# Patient Record
Sex: Female | Born: 1975
Health system: Southern US, Community
[De-identification: ages and names within clinical notes are randomized; demographics above are authoritative.]

## PROBLEM LIST (undated history)

## (undated) DIAGNOSIS — F419 Anxiety disorder, unspecified: Secondary | ICD-10-CM

## (undated) DIAGNOSIS — I10 Essential (primary) hypertension: Secondary | ICD-10-CM

## (undated) DIAGNOSIS — R519 Headache, unspecified: Secondary | ICD-10-CM

## (undated) DIAGNOSIS — G5600 Carpal tunnel syndrome, unspecified upper limb: Secondary | ICD-10-CM

## (undated) DIAGNOSIS — K219 Gastro-esophageal reflux disease without esophagitis: Secondary | ICD-10-CM

## (undated) DIAGNOSIS — M1990 Unspecified osteoarthritis, unspecified site: Secondary | ICD-10-CM

## (undated) HISTORY — PX: BREAST CYST ASPIRATION: SHX578

## (undated) HISTORY — PX: OTHER SURGICAL HISTORY: SHX169

## (undated) HISTORY — PX: LAPAROSCOPY FOR ECTOPIC PREGNANCY: SUR765

## (undated) HISTORY — DX: Essential (primary) hypertension: I10

## (undated) HISTORY — PX: ABDOMINAL HYSTERECTOMY: SHX81

---

## 2011-02-07 DIAGNOSIS — A6000 Herpesviral infection of urogenital system, unspecified: Secondary | ICD-10-CM | POA: Insufficient documentation

## 2012-06-04 DIAGNOSIS — R7611 Nonspecific reaction to tuberculin skin test without active tuberculosis: Secondary | ICD-10-CM | POA: Insufficient documentation

## 2014-09-27 LAB — HIV ANTIBODY (ROUTINE TESTING W REFLEX): HIV 1&2 Ab, 4th Generation: NEGATIVE

## 2015-03-22 DIAGNOSIS — F419 Anxiety disorder, unspecified: Secondary | ICD-10-CM | POA: Insufficient documentation

## 2015-05-29 DIAGNOSIS — I10 Essential (primary) hypertension: Secondary | ICD-10-CM | POA: Insufficient documentation

## 2016-09-03 DIAGNOSIS — D508 Other iron deficiency anemias: Secondary | ICD-10-CM | POA: Insufficient documentation

## 2016-10-16 LAB — HM PAP SMEAR: HM Pap smear: NEGATIVE

## 2016-10-23 LAB — HM MAMMOGRAPHY

## 2017-01-26 DIAGNOSIS — M79645 Pain in left finger(s): Secondary | ICD-10-CM | POA: Diagnosis not present

## 2017-01-26 DIAGNOSIS — I10 Essential (primary) hypertension: Secondary | ICD-10-CM | POA: Diagnosis not present

## 2017-01-26 DIAGNOSIS — E559 Vitamin D deficiency, unspecified: Secondary | ICD-10-CM | POA: Diagnosis not present

## 2017-03-21 DIAGNOSIS — R102 Pelvic and perineal pain: Secondary | ICD-10-CM | POA: Diagnosis not present

## 2017-03-21 DIAGNOSIS — Z30431 Encounter for routine checking of intrauterine contraceptive device: Secondary | ICD-10-CM | POA: Diagnosis not present

## 2017-04-04 DIAGNOSIS — R1031 Right lower quadrant pain: Secondary | ICD-10-CM | POA: Diagnosis not present

## 2017-04-04 DIAGNOSIS — Z30431 Encounter for routine checking of intrauterine contraceptive device: Secondary | ICD-10-CM | POA: Diagnosis not present

## 2017-04-04 DIAGNOSIS — R102 Pelvic and perineal pain: Secondary | ICD-10-CM | POA: Diagnosis not present

## 2017-04-12 DIAGNOSIS — N946 Dysmenorrhea, unspecified: Secondary | ICD-10-CM | POA: Diagnosis not present

## 2017-04-12 DIAGNOSIS — G44209 Tension-type headache, unspecified, not intractable: Secondary | ICD-10-CM | POA: Diagnosis not present

## 2017-04-12 DIAGNOSIS — G43009 Migraine without aura, not intractable, without status migrainosus: Secondary | ICD-10-CM | POA: Diagnosis not present

## 2017-06-27 DIAGNOSIS — M722 Plantar fascial fibromatosis: Secondary | ICD-10-CM | POA: Diagnosis not present

## 2017-06-27 DIAGNOSIS — I10 Essential (primary) hypertension: Secondary | ICD-10-CM | POA: Diagnosis not present

## 2017-06-27 DIAGNOSIS — R05 Cough: Secondary | ICD-10-CM | POA: Diagnosis not present

## 2017-08-03 ENCOUNTER — Encounter: Payer: Self-pay | Admitting: Family Medicine

## 2017-08-03 ENCOUNTER — Ambulatory Visit (INDEPENDENT_AMBULATORY_CARE_PROVIDER_SITE_OTHER): Payer: 59 | Admitting: Family Medicine

## 2017-08-03 VITALS — BP 142/86 | HR 86 | Temp 98.8°F | Resp 16 | Ht 66.0 in | Wt 236.2 lb

## 2017-08-03 DIAGNOSIS — I10 Essential (primary) hypertension: Secondary | ICD-10-CM

## 2017-08-03 DIAGNOSIS — R29818 Other symptoms and signs involving the nervous system: Secondary | ICD-10-CM

## 2017-08-03 DIAGNOSIS — Z7689 Persons encountering health services in other specified circumstances: Secondary | ICD-10-CM | POA: Diagnosis not present

## 2017-08-03 DIAGNOSIS — G43009 Migraine without aura, not intractable, without status migrainosus: Secondary | ICD-10-CM | POA: Diagnosis not present

## 2017-08-03 DIAGNOSIS — E559 Vitamin D deficiency, unspecified: Secondary | ICD-10-CM | POA: Diagnosis not present

## 2017-08-03 DIAGNOSIS — A6000 Herpesviral infection of urogenital system, unspecified: Secondary | ICD-10-CM

## 2017-08-03 NOTE — Progress Notes (Signed)
Subjective:    Patient ID: Linda Vega, female    DOB: 1976-05-13, 42 y.o.   MRN: [id number]  Linda Vega is a 42 y.o. female presenting on 08/03/2017 for Establish Care (HTN, sleep apnea)  Here to establish care. Previous PCP [facility], changed due to new insurance.  HPI   CHRONIC HTN: Reports history of HTN for >3-4 years approximately, in past had home BP reading 130s/80-90s Current Meds - Amlodipine 46m - Previously on Propranolol for migraine prophylaxis has been off of this for while now Reports good compliance, took meds today. Tolerating well, w/o complaints. Lifestyle: - Diet: goal for low salt diet, inc water intake, inc fruits / veggies - Exercise: plans to increase regular exercise now, cardio exercise Denies CP, dyspnea, HA, edema, dizziness / lightheadedness  Suspected OSA / Snoring Reports concern requesting sleep study testing due to concern with some difficulty with weight and chronic snoring reported by significant other. No noted observed apnea - Denies excessive daytime sleepiness - See screening  History of Anxiety - See score below GAD. No formal prior diagnosis. Attributes to life stressors. - Scheduled to see [facility] Employee Therapist on 08/13/17  History of Migraines - Prior history of infrequent migraines, late onset problem within past 1-2 years, unsure exactly - In past on Propranolol for prevention - Unable to know when onset, had one severe episode in bed unable to get out and tension and nausea vomiting  Mild low Vitamin D on prior lab  Epworth Sleepiness Scale Total Score: 0 Sitting and reading - 0 Watching TV - 0 Sitting inactive in a public place - 0 As a passenger in a car for an hour without a break - 0 Lying down to rest in the afternoon when circumstances permit - 0 Sitting and talking to someone - 0 Sitting quietly after a lunch without alcohol - 0 In a car, while stopped for a few minutes in traffic - 0  STOP-Bang OSA  scoring Snoring yes   Tiredness no   Observed apneas no   Pressure HTN yes   BMI > 35 kg/m2 yes   Age > 550 no   Neck (female >17 in; Female >16 in)  yes 163  Gender female no   OSA risk low (0-2)  OSA risk intermediate (3-4)  OSA risk high (5+)  Total:  (4 intermediate risk)   Health Maintenance:  Breast CA Screening: UTD. Last mammogram result negative, done 10/23/2016 done at [facility] Prior dx Fibrocystic disease and dense tissue, no other abnormal. Maternal aunts x 2, age 44-50s treated, maternal grandmother breast cancer. Currently asymptomatic.  No known personal or family history of colon cancer, due for screening age 4348to 358 Cervical CA Screening - Last Pap smear done 10/16/16 per [facility] PCP, negative for abnormal or malignant cells and negative HPV co testing, next due 09/2019 (3-5 years)  [facility] 08/[facility]/2017  Decreased Interest 0  Down, Depressed, Hopeless 0  PHQ - 2 Score 0   GAD 7 : Generalized Anxiety Score 08/03/2017  Nervous, Anxious, on Edge 3  Control/stop worrying 2  Worry too much - different things 2  Trouble relaxing 0  Restless 0  Easily annoyed or irritable 2  Afraid - awful might happen 2  Total GAD 7 Score 11  Anxiety Difficulty Very difficult    Past Medical History:  Diagnosis Date  . Hypertension    Past Surgical History:  Procedure Laterality Date  .  CESAREAN SECTION    . fibrocystic breast     Social History   Socioeconomic History  . Marital status: Single    Spouse name: Not on file  . Number of children: Not on file  . Years of education: Not on file  . Highest education level: Not on file  Social Needs  . Financial resource strain: Not on file  . Food insecurity - worry: Not on file  . Food insecurity - inability: Not on file  . Transportation needs - medical: Not on file  . Transportation needs - non-medical: Not on file  Occupational History  . Occupation: Office Rep/Admin    CommentSoil scientist   Tobacco Use  . Smoking status: Never Smoker  . Smokeless tobacco: Never Used  Substance and Sexual Activity  . Alcohol use: No    Frequency: Never  . Drug use: No  . Sexual activity: Not on file  Other Topics Concern  . Not on file  Social History Narrative  . Not on file   Family History  Problem Relation Age of Onset  . Hypertension Mother    Current Outpatient Medications on File Prior to Visit  Medication Sig  . eletriptan (RELPAX) 20 MG tablet Take 1 tab when headache starts and can repeat 1 tab in 2 hours if headache persists  . ibuprofen (ADVIL,MOTRIN) 800 MG tablet Take by mouth.  . valACYclovir (VALTREX) 500 MG tablet Take by mouth.  Marland Kitchen amLODipine (NORVASC) 5 MG tablet    No current facility-administered medications on file prior to visit.     Review of Systems  Constitutional: Negative for activity change, appetite change, chills, diaphoresis, fatigue and fever.  HENT: Negative for congestion and hearing loss.   Eyes: Negative for visual disturbance.  Respiratory: Negative for apnea, cough, choking, chest tightness, shortness of breath and wheezing.        Snoring  Cardiovascular: Negative for chest pain, palpitations and leg swelling.  Gastrointestinal: Negative for abdominal pain, anal bleeding, blood in stool, constipation, diarrhea, nausea and vomiting.  Endocrine: Negative for cold intolerance.  Genitourinary: Negative for decreased urine volume, difficulty urinating, dysuria, frequency, hematuria and urgency.  Musculoskeletal: Negative for arthralgias, back pain and neck pain.  Skin: Negative for rash.  Allergic/Immunologic: Negative for environmental allergies.  Neurological: Negative for dizziness, weakness, light-headedness, numbness and headaches.  Hematological: Negative for adenopathy.  Psychiatric/Behavioral: Negative for behavioral problems, dysphoric mood and sleep disturbance. The patient is not nervous/anxious.    Per HPI unless specifically  indicated above     Objective:    BP (!) 142/86 (BP Location: Right Arm, Cuff Size: Normal)   Pulse 86   Temp 98.8 F (37.1 C) (Oral)   Resp 16   Ht 5' 6"  (1.676 m)   Wt 236 lb 3.2 oz (107.1 kg)   BMI 38.12 kg/m   Wt Readings from Last 3 Encounters:  08/03/17 236 lb 3.2 oz (107.1 kg)    Physical Exam  Constitutional: She is oriented to person, place, and time. She appears well-developed and well-nourished. No distress.  Well-appearing, comfortable, cooperative  HENT:  Head: Normocephalic and atraumatic.  Mouth/Throat: Oropharynx is clear and moist.  Eyes: Conjunctivae are normal. Right eye exhibits no discharge. Left eye exhibits no discharge.  Neck: Normal range of motion. Neck supple. No thyromegaly present.  Neck circumference 16"  Cardiovascular: Normal rate, regular rhythm, normal heart sounds and intact distal pulses.  No murmur heard. Pulmonary/Chest: Effort normal and breath sounds normal. No  respiratory distress. She has no wheezes. She has no rales.  Musculoskeletal: Normal range of motion. She exhibits no edema.  Lymphadenopathy:    She has no cervical adenopathy.  Neurological: She is alert and oriented to person, place, and time.  Skin: Skin is warm and dry. No rash noted. She is not diaphoretic. No erythema.  Psychiatric: She has a normal mood and affect. Her behavior is normal.  Well groomed, good eye contact, normal speech and thoughts  Nursing note and vitals reviewed.  Results for orders placed or performed in visit on 08/03/17  HM MAMMOGRAPHY  Result Value Ref Range   HM Mammogram 0-4 Bi-Rad 0-4 Bi-Rad, Self Reported Normal  HM PAP SMEAR  Result Value Ref Range   HM Pap smear Negative for malignancy and negative HPV   HIV antibody  Result Value Ref Range   HIV 1&2 Ab, 4th Generation Negative       Assessment & Plan:   Problem List Items Addressed This Visit    Genital herpes    Stable controlled On Valtrex PRN      Relevant Medications    valACYclovir (VALTREX) 500 MG tablet   Hypertension - Primary    Mildly elevated initial BP, repeat manual check improved but still borderline elevated. - Home BP readings limited availability from last year, similar to >354/65  No known complications    Plan:  1. Continue current BP regimen - Amlodipine 24m daily - Hold on resuming propranolol for now 2. Encourage improved lifestyle - low sodium diet, regular exercise 3. Start monitor BP outside office, bring readings to next visit, if persistently >140/90 or new symptoms notify office sooner 4. Follow-up 6 weeks labs etc, may need to inc amlod to 10 or add 2nd agent      Relevant Medications   amLODipine (NORVASC) 5 MG tablet   Migraine without aura and without status migrainosus, not intractable    Stable, rare occurrence, without flare Prior problem >1 year On prophylaxis propranolol, no longer Has Relpax rx PRN rarely use Monitor headaches, re consider prophylaxis      Relevant Medications   amLODipine (NORVASC) 5 MG tablet   eletriptan (RELPAX) 20 MG tablet   ibuprofen (ADVIL,MOTRIN) 800 MG tablet   Suspected sleep apnea    Persistent clinical concern for suspected obstructive sleep apnea given reported symptoms with snoring and sleep disturbance without fatigue or excessive sleepiness. - Screening: ESS score 0 / STOP-Bang Score 4 intermediate risk - Neck Circumference: 16" - Co-morbidities: HTN, Anxiety  Plan: 1. Discussion on initial diagnosis and testing for OSA, risk factors, management, complications 2. Agree to proceed with sleep study testing based on clinical concerns - referral sent to [facility] fax       Vitamin D deficiency    Prior low 24 insufficiency range Finished 5k daily x 12 weeks Resume 1-2k daily maintenance OTC       Other Visit Diagnoses    Encounter to establish care with new doctor          No orders of the defined types were placed in this encounter.   Follow up  plan: Return in about 6 weeks (around 09/14/2017) for Annual Physical.  Future labs placed for [place] [facility] 08/05/2017, 11:44 AM

## 2017-08-03 NOTE — Patient Instructions (Addendum)
Thank you for coming to the office today.  1.  Start OTC Vitamin D3 1,000 to 2,000 iu daily for maintenance  We will check Vitamin D again with upcoming labs in February  2. Blood pressure still borderline elevated, keep checking BP 2-3x weekly for next few weeks and bring to office we can review  Goal < 135/85  3.   Referral to [facility] - check insurance coverage sleep study may be at home   DUE for FASTING BLOOD WORK (no food or drink after midnight before the lab appointment, only water or coffee without cream/sugar on the morning of)  SCHEDULE "Lab Only" visit in the morning at the clinic for lab draw in 6 weeks  - Make sure Lab Only appointment is at about 1 week before your next appointment, so that results will be available  For Lab Results, once available within 2-3 days of blood draw, you can can log in to MyChart online to view your results and a brief explanation. Also, we can discuss results at next follow-up visit.  Please schedule a Follow-up Appointment to: Return in about 6 weeks (around 09/14/2017) for Annual Physical.    If you have any other questions or concerns, please feel free to call the office or send a message through [place]. You may also schedule an earlier appointment if necessary.  Additionally, you may be receiving a survey about your experience at our office within a few days to 1 week by e-mail or mail. We value your feedback.  [name], DO [place]

## 2017-08-05 ENCOUNTER — Encounter: Payer: Self-pay | Admitting: Family Medicine

## 2017-08-05 ENCOUNTER — Other Ambulatory Visit: Payer: Self-pay | Admitting: Family Medicine

## 2017-08-05 DIAGNOSIS — D508 Other iron deficiency anemias: Secondary | ICD-10-CM

## 2017-08-05 DIAGNOSIS — E559 Vitamin D deficiency, unspecified: Secondary | ICD-10-CM

## 2017-08-05 DIAGNOSIS — R29818 Other symptoms and signs involving the nervous system: Secondary | ICD-10-CM

## 2017-08-05 DIAGNOSIS — Z0000 Encounter for general adult medical examination without abnormal findings: Secondary | ICD-10-CM

## 2017-08-05 DIAGNOSIS — R0683 Snoring: Secondary | ICD-10-CM | POA: Insufficient documentation

## 2017-08-05 DIAGNOSIS — G43009 Migraine without aura, not intractable, without status migrainosus: Secondary | ICD-10-CM

## 2017-08-05 DIAGNOSIS — I10 Essential (primary) hypertension: Secondary | ICD-10-CM

## 2017-08-05 DIAGNOSIS — R7309 Other abnormal glucose: Secondary | ICD-10-CM

## 2017-08-05 NOTE — Assessment & Plan Note (Signed)
Mildly elevated initial BP, repeat manual check improved but still borderline elevated. - Home BP readings limited availability from last year, similar to >600/45  No known complications    Plan:  1. Continue current BP regimen - Amlodipine 38m daily - Hold on resuming propranolol for now 2. Encourage improved lifestyle - low sodium diet, regular exercise 3. Start monitor BP outside office, bring readings to next visit, if persistently >140/90 or new symptoms notify office sooner 4. Follow-up 6 weeks labs etc, may need to inc amlod to 10 or add 2nd agent

## 2017-08-05 NOTE — Assessment & Plan Note (Signed)
Stable controlled On Valtrex PRN

## 2017-08-05 NOTE — Assessment & Plan Note (Signed)
Prior low 24 insufficiency range Finished 5k daily x 12 weeks Resume 1-2k daily maintenance OTC

## 2017-08-05 NOTE — Assessment & Plan Note (Addendum)
Stable, rare occurrence, without flare Prior problem >1 year On prophylaxis propranolol, no longer Has Relpax rx PRN rarely use Monitor headaches, re consider prophylaxis

## 2017-08-05 NOTE — Assessment & Plan Note (Signed)
Persistent clinical concern for suspected obstructive sleep apnea given reported symptoms with snoring and sleep disturbance without fatigue or excessive sleepiness. - Screening: ESS score 0 / STOP-Bang Score 4 intermediate risk - Neck Circumference: 16" - Co-morbidities: HTN, Anxiety  Plan: 1. Discussion on initial diagnosis and testing for OSA, risk factors, management, complications 2. Agree to proceed with sleep study testing based on clinical concerns - referral sent to [facility] via fax

## 2017-08-07 ENCOUNTER — Encounter: Payer: Self-pay | Admitting: Family Medicine

## 2017-08-14 DIAGNOSIS — N644 Mastodynia: Secondary | ICD-10-CM | POA: Diagnosis not present

## 2017-08-14 DIAGNOSIS — N921 Excessive and frequent menstruation with irregular cycle: Secondary | ICD-10-CM | POA: Diagnosis not present

## 2017-08-14 DIAGNOSIS — Z975 Presence of (intrauterine) contraceptive device: Secondary | ICD-10-CM | POA: Diagnosis not present

## 2017-08-14 DIAGNOSIS — N939 Abnormal uterine and vaginal bleeding, unspecified: Secondary | ICD-10-CM | POA: Diagnosis not present

## 2017-08-17 ENCOUNTER — Encounter: Payer: Self-pay | Admitting: Family Medicine

## 2017-08-17 DIAGNOSIS — E559 Vitamin D deficiency, unspecified: Secondary | ICD-10-CM

## 2017-08-17 MED ORDER — CALCIUM CARBONATE 500 MG PO CHEW: 500.0000 mg | CHEWABLE_TABLET | Freq: Every day | ORAL | 3 refills | Status: DC

## 2017-08-20 MED ORDER — VITAMIN D3 25 MCG (1000 UNIT) PO TABS: 1000.0000 [IU] | ORAL_TABLET | Freq: Every day | ORAL | 3 refills | Status: DC

## 2017-08-20 NOTE — Addendum Note (Signed)
Addended by: Olin Hauser on: 08/20/2017 12:27 PM   Modules accepted: Orders

## 2017-08-27 DIAGNOSIS — D62 Acute posthemorrhagic anemia: Secondary | ICD-10-CM | POA: Insufficient documentation

## 2017-08-29 ENCOUNTER — Encounter: Payer: Self-pay | Admitting: Family Medicine

## 2017-08-29 DIAGNOSIS — T8332XA Displacement of intrauterine contraceptive device, initial encounter: Secondary | ICD-10-CM | POA: Diagnosis not present

## 2017-08-29 DIAGNOSIS — Z30432 Encounter for removal of intrauterine contraceptive device: Secondary | ICD-10-CM | POA: Diagnosis not present

## 2017-08-29 DIAGNOSIS — D259 Leiomyoma of uterus, unspecified: Secondary | ICD-10-CM | POA: Diagnosis not present

## 2017-08-29 DIAGNOSIS — N939 Abnormal uterine and vaginal bleeding, unspecified: Secondary | ICD-10-CM | POA: Diagnosis not present

## 2017-08-29 DIAGNOSIS — N83292 Other ovarian cyst, left side: Secondary | ICD-10-CM | POA: Diagnosis not present

## 2017-08-30 DIAGNOSIS — E663 Overweight: Secondary | ICD-10-CM | POA: Diagnosis not present

## 2017-08-30 DIAGNOSIS — R0683 Snoring: Secondary | ICD-10-CM | POA: Diagnosis not present

## 2017-08-30 DIAGNOSIS — J351 Hypertrophy of tonsils: Secondary | ICD-10-CM | POA: Diagnosis not present

## 2017-08-30 DIAGNOSIS — J301 Allergic rhinitis due to pollen: Secondary | ICD-10-CM | POA: Diagnosis not present

## 2017-09-10 ENCOUNTER — Encounter: Payer: Self-pay | Admitting: Family Medicine

## 2017-09-10 ENCOUNTER — Other Ambulatory Visit
Admission: RE | Admit: 2017-09-10 | Discharge: 2017-09-10 | Disposition: A | Discharge status: Home / Self Care | Payer: 59 | Source: Ambulatory Visit | Attending: Family Medicine | Admitting: Family Medicine

## 2017-09-10 DIAGNOSIS — G43009 Migraine without aura, not intractable, without status migrainosus: Secondary | ICD-10-CM | POA: Diagnosis not present

## 2017-09-10 DIAGNOSIS — E559 Vitamin D deficiency, unspecified: Secondary | ICD-10-CM

## 2017-09-10 DIAGNOSIS — R7309 Other abnormal glucose: Secondary | ICD-10-CM | POA: Diagnosis not present

## 2017-09-10 DIAGNOSIS — Z0000 Encounter for general adult medical examination without abnormal findings: Secondary | ICD-10-CM | POA: Diagnosis not present

## 2017-09-10 DIAGNOSIS — D508 Other iron deficiency anemias: Secondary | ICD-10-CM

## 2017-09-10 DIAGNOSIS — I10 Essential (primary) hypertension: Secondary | ICD-10-CM | POA: Diagnosis not present

## 2017-09-10 LAB — LIPID PANEL
Cholesterol: 156 mg/dL (ref 0–200)
HDL: 39 mg/dL — AB (ref >40)
LDL CALC: 103 mg/dL — AB (ref 0–99)
TRIGLYCERIDES: 71 mg/dL (ref <150)
Total CHOL/HDL Ratio: 4 RATIO
VLDL: 14 mg/dL (ref 0–40)

## 2017-09-10 LAB — CBC WITH DIFFERENTIAL/PLATELET
BASOS ABS: 0.1 10*3/uL (ref 0–0.1)
BASOS PCT: 1 %
EOS PCT: 4 %
Eosinophils Absolute: 0.2 10*3/uL (ref 0–0.7)
HEMATOCRIT: 37.2 % (ref 35.0–47.0)
Hemoglobin: 12.2 g/dL (ref 12.0–16.0)
Lymphocytes Relative: 36 %
Lymphs Abs: 1.7 10*3/uL (ref 1.0–3.6)
MCH: 23.3 pg — ABNORMAL LOW (ref 26.0–34.0)
MCHC: 32.8 g/dL (ref 32.0–36.0)
MCV: 70.9 fL — ABNORMAL LOW (ref 80.0–100.0)
MONO ABS: 0.5 10*3/uL (ref 0.2–0.9)
MONOS PCT: 12 %
NEUTROS ABS: 2.1 10*3/uL (ref 1.4–6.5)
Neutrophils Relative %: 47 %
PLATELETS: 233 10*3/uL (ref 150–440)
RBC: 5.24 MIL/uL — ABNORMAL HIGH (ref 3.80–5.20)
RDW: 16 % — AB (ref 11.5–14.5)
WBC: 4.6 10*3/uL (ref 3.6–11.0)

## 2017-09-10 LAB — COMPREHENSIVE METABOLIC PANEL
ALBUMIN: 4 g/dL (ref 3.5–5.0)
ALT: 17 U/L (ref 14–54)
ANION GAP: 5 (ref 5–15)
AST: 24 U/L (ref 15–41)
Alkaline Phosphatase: 59 U/L (ref 38–126)
BUN: 10 mg/dL (ref 6–20)
CHLORIDE: 103 mmol/L (ref 101–111)
CO2: 25 mmol/L (ref 22–32)
Calcium: 8.8 mg/dL — ABNORMAL LOW (ref 8.9–10.3)
Creatinine, Ser: 0.89 mg/dL (ref 0.44–1.00)
GFR calc non Af Amer: >60 mL/min (ref >60)
GLUCOSE: 105 mg/dL — AB (ref 65–99)
Potassium: 4 mmol/L (ref 3.5–5.1)
SODIUM: 133 mmol/L — AB (ref 135–145)
Total Bilirubin: 0.6 mg/dL (ref 0.3–1.2)
Total Protein: 7.3 g/dL (ref 6.5–8.1)

## 2017-09-10 LAB — HEMOGLOBIN A1C
HEMOGLOBIN A1C: 5.5 % (ref 4.8–5.6)
Mean Plasma Glucose: 111.15 mg/dL

## 2017-09-10 LAB — TSH: TSH: 1.572 u[IU]/mL (ref 0.350–4.500)

## 2017-09-10 NOTE — Addendum Note (Signed)
Addended by: Elmo Putt on: 09/10/2017 08:04 AM   Modules accepted: Orders

## 2017-09-10 NOTE — Addendum Note (Signed)
Addended by: Elmo Putt on: 09/10/2017 08:05 AM   Modules accepted: Orders

## 2017-09-11 LAB — VITAMIN D 25 HYDROXY (VIT D DEFICIENCY, FRACTURES): VIT D 25 HYDROXY: 23.8 ng/mL — AB (ref 30.0–100.0)

## 2017-09-14 ENCOUNTER — Ambulatory Visit (INDEPENDENT_AMBULATORY_CARE_PROVIDER_SITE_OTHER): Payer: 59 | Admitting: Family Medicine

## 2017-09-14 ENCOUNTER — Encounter: Payer: Self-pay | Admitting: Family Medicine

## 2017-09-14 VITALS — BP 136/84 | HR 89 | Temp 99.0°F | Resp 16 | Ht 66.0 in | Wt 231.0 lb

## 2017-09-14 DIAGNOSIS — Z1231 Encounter for screening mammogram for malignant neoplasm of breast: Secondary | ICD-10-CM | POA: Diagnosis not present

## 2017-09-14 DIAGNOSIS — J3089 Other allergic rhinitis: Secondary | ICD-10-CM | POA: Diagnosis not present

## 2017-09-14 DIAGNOSIS — E559 Vitamin D deficiency, unspecified: Secondary | ICD-10-CM

## 2017-09-14 DIAGNOSIS — I10 Essential (primary) hypertension: Secondary | ICD-10-CM | POA: Diagnosis not present

## 2017-09-14 DIAGNOSIS — Z0000 Encounter for general adult medical examination without abnormal findings: Secondary | ICD-10-CM

## 2017-09-14 DIAGNOSIS — D508 Other iron deficiency anemias: Secondary | ICD-10-CM | POA: Diagnosis not present

## 2017-09-14 DIAGNOSIS — R0683 Snoring: Secondary | ICD-10-CM | POA: Diagnosis not present

## 2017-09-14 DIAGNOSIS — Z1239 Encounter for other screening for malignant neoplasm of breast: Secondary | ICD-10-CM

## 2017-09-14 DIAGNOSIS — E669 Obesity, unspecified: Secondary | ICD-10-CM | POA: Diagnosis not present

## 2017-09-14 MED ORDER — LORATADINE 10 MG PO TABS: 10.0000 mg | ORAL_TABLET | Freq: Every day | ORAL | 3 refills | Status: DC

## 2017-09-14 NOTE — Patient Instructions (Addendum)
Thank you for coming to the office today.   1.  Regarding snoring and less likely sleep apnea now - I think that if you continue with lifestyle plan and weight loss this will improve - however you may consider a dental appliance or mouthpiece to help improve breathing / reduce snoring at night, these are usually OTC or at pharmacy or may order online, check with pharmacist or look up info online or ask dentist for options  2.  Call Dr Kathyrn Sheriff to check to see if they do any allergy specifically food allergy testing  Othewrise let me know if you need referral to [facility] [address] [place], [place]  [address] Phone: [phone]  Component Name 1. Shrimp. 2. Carrot, 3 broccoli 05/29/2011 05/27/2011 05/27/2011   2.98     0.5     <0.35   IgE 618 (2012)  2. BP is elevated - consistently >135/85, and some other higher numbers, INCREASE Amlodipine from 27m to 150m- take TWO of the 5s for now, after 2 weeks if notice improved BP < 135/85 consistently then notify office mychart we can send a new rx amlodipine 1043maily  For congestion take Nasocort still may take few weeks  Take Claritin (Loratadine) generic sent rx - if not get OTC  May try nasal saline spray as well to help flush sinuses  -------------------------------------  For Mammogram screening for breast cancer   Call the [facility] anytime to schedule your own appointment now that order has been placed.  [facility] [id number]: [phone] [address] [id number]: [phone] schedule a Follow-up Appointment to: Return in about 6 months (around 03/14/2018) for HTN.  If you have any other questions or concerns, please feel free to call the office or send a message  through [place] may also schedule an earlier appointment if necessary.  Additionally, you may be receiving a survey about your experience at our office within a few days to 1 week by e-mail or mail. We value your feedback.  AleNobie PutnamO [place]

## 2017-09-14 NOTE — Assessment & Plan Note (Signed)
Stable still low mildly 23.8 Advised can either do 5k daily for 8 weeks or continue OTC 2k daily for maintenance

## 2017-09-14 NOTE — Assessment & Plan Note (Signed)
Suspect multifactorial etiology may be dietary and also history of menorrhagia Seems improved now Hgb back to normal range >12 Still low MCV Defer iron treatment at this time, asymptomatic Will trend, in future consider iron studies

## 2017-09-14 NOTE — Assessment & Plan Note (Signed)
Mildly elevated initial BP, repeat manual check improved but still borderline elevated. - Home BP readings more elevated recently up to [id number]/74-827 No known complications    Plan:  1. INCREASE Amlodipine from 5 to 25m - double current dose, then if improved on home checks notify office we can send new rx 148mdaily in future 2. Encourage improved lifestyle - low sodium diet, regular exercise 3. Continue monitor BP outside office, bring readings to next visit, if persistently >140/90 or new symptoms notify office sooner 4. Follow-up sooner if needed consider add 2nd agent if need, should provide update to clinic - otherwise plan on 6 month if stable

## 2017-09-14 NOTE — Assessment & Plan Note (Signed)
Possibly less likely after recent ENT evaluation Discussed may benefit from mouth guard or dental appliance to reduce snoring Counseling wt loss Follow-up if worsening symptoms consider repeat screening / referral if need

## 2017-09-14 NOTE — Assessment & Plan Note (Signed)
Counseling on continued improve wt loss lifestyle diet/exercise

## 2017-09-14 NOTE — Progress Notes (Signed)
Subjective:    Patient ID: Linda Vega, female    DOB: [birth year], 42 y.o.   MRN: [id number]  Linda Vega is a 42 y.o. female presenting on 09/14/2017 for Annual Exam   HPI   Here for Annual Physical and Lab Review  CHRONIC HTN: Reports history of HTN for >3-4 years approximately, in past had home BP reading 130s/80-90s - now has had elevated readings since last visit at home ranging 144/96, 156/101, 130/84, 151/103 Current Meds - Amlodipine 12m - Previously on Propranolol for migraine prophylaxis has been off of this for while now Reports good compliance, took meds today. Tolerating well, w/o complaints.  Suspected OSA / Snoring Last apt 07/2017, discussed this concern with some difficulty with weight and chronic snoring reported by significant other. No noted observed apnea - She had negative Epworth Score, [place] [facility] [place] [facility] due to her history - She saw Dr JKathyrn SheriffENT who evaluated her and said unlikely to be OSA - Denies excessive daytime sleepiness  Allergies / Environmental vs Food She reports some history of some possible food allergies, dairy milk etc, and some acidic foods - Chart review shows some prior allergy testing IgE 618 (2012) and positive allergy to Shrimp (shellfish) ab and also negative testing carrot and broccoli - Asking if ENT will do food allergy testing or if need referral  Vitamin D Deficiency Prior lab mild low, she finished Vit D 5k daily for 12 weeks, has been taking 1-2k daily for now on maintenance, limited improvement.  History of low MCV / Anemia Reports prior history of heavier periods, had IUD then this caused other menstrual irregularity, she has had history of lower Hgb in 11 in past. Recent lab CBC showed Hgb 12 range, still low MCV. Asymptomatic. Bleeding seems stable with periods now.  HYPERLIPIDEMIA / Obesity BMI >37 - Reports no concerns. Last lipid panel  08/2017, mostly controlled  - not on cholesterol medicine Lifestyle: - Diet: goal for low salt diet, inc water intake, inc fruits / veggies - Exercise: plans to increase regular exercise now, cardio exercise   Health Maintenance:  Breast CA Screening: UTD. Last mammogram result negative, [place] [facility] Prior dx Fibrocystic disease and dense tissue, no other abnormal. Maternal aunts x 2, age 57449-50s treated, maternal grandmother breast cancer. Currently asymptomatic. - Advised her that she may schedule screening mammograms yearly or every other year for now as my recommendation, [facility]  No known personal or family history of colon cancer, due for screening age 55497to 89 Cervical CA Screening - Last Pap smear done 10/16/16 per [facility] PCP, negative for abnormal or malignant cells and negative HPV co testing, next due 09/2019 (3-5 years)  [facility] 09/14/2017 08/03/2017  Decreased Interest 0 0  Down, Depressed, Hopeless 0 0  PHQ - 2 Score 0 0    Past Medical History:  Diagnosis Date  . Hypertension    Past Surgical History:  Procedure Laterality Date  . CESAREAN SECTION    . fibrocystic breast     Social History   Socioeconomic History  . Marital status: Single    Spouse name: Not on file  . Number of children: Not on file  . Years of education: Not on file  . Highest education level: Not on file  Social Needs  . FEmergency planning/management officer  strain: Not on file  . Food insecurity - worry: Not on file  . Food insecurity - inability: Not on file  . Transportation needs - medical: Not on file  . Transportation needs - non-medical: Not on file  Occupational History  . Occupation: Office Rep/Admin    CommentSoil scientist  Tobacco Use  . Smoking status: Never Smoker  . Smokeless tobacco: Never Used  Substance and Sexual Activity  . Alcohol use: No    Frequency: Never  . Drug use: No    . Sexual activity: Not on file  Other Topics Concern  . Not on file  Social History Narrative  . Not on file   Family History  Problem Relation Age of Onset  . Hypertension Mother    Current Outpatient Medications on File Prior to Visit  Medication Sig  . amLODipine (NORVASC) 5 MG tablet Take 10 mg by mouth daily.  . Calcium Carbonate 500 MG CHEW Chew 1 tablet (500 mg total) by mouth daily.  . cholecalciferol (VITAMIN D) 1000 units tablet Take 1 tablet (1,000 Units total) by mouth daily.  Marland Kitchen eletriptan (RELPAX) 20 MG tablet Take 1 tab when headache starts and can repeat 1 tab in 2 hours if headache persists  . ibuprofen (ADVIL,MOTRIN) 800 MG tablet Take by mouth.  . valACYclovir (VALTREX) 500 MG tablet Take by mouth.   No current facility-administered medications on file prior to visit.     Review of Systems  Constitutional: Negative for activity change, appetite change, chills, diaphoresis, fatigue, fever and unexpected weight change.  HENT: Positive for congestion, postnasal drip and sneezing. Negative for hearing loss, sinus pressure, sinus pain and sore throat.   Eyes: Negative for visual disturbance.  Respiratory: Negative for apnea, cough, choking, chest tightness, shortness of breath and wheezing.   Cardiovascular: Negative for chest pain, palpitations and leg swelling.  Gastrointestinal: Negative for abdominal pain, anal bleeding, blood in stool, constipation, diarrhea, nausea and vomiting.  Endocrine: Negative for cold intolerance and polyuria.  Genitourinary: Negative for decreased urine volume, difficulty urinating, dysuria, frequency, hematuria, pelvic pain and urgency.  Musculoskeletal: Negative for arthralgias, back pain and neck pain.  Skin: Negative for rash.  Allergic/Immunologic: Positive for environmental allergies and food allergies.  Neurological: Negative for dizziness, weakness, light-headedness, numbness and headaches.  Hematological: Negative for  adenopathy.  Psychiatric/Behavioral: Negative for behavioral problems, dysphoric mood and sleep disturbance. The patient is not nervous/anxious.    Per HPI unless specifically indicated above     Objective:    BP 136/84 (BP Location: Left Arm, Cuff Size: Normal)   Pulse 89   Temp 99 F (37.2 C) (Oral)   Resp 16   Ht 5' 6"  (1.676 m)   Wt 231 lb (104.8 kg)   BMI 37.28 kg/m   Wt Readings from Last 3 Encounters:  09/14/17 231 lb (104.8 kg)  08/03/17 236 lb 3.2 oz (107.1 kg)    Physical Exam  Constitutional: She is oriented to person, place, and time. She appears well-developed and well-nourished. No distress.  Well-appearing, comfortable, cooperative  HENT:  Head: Normocephalic and atraumatic.  Mouth/Throat: Oropharynx is clear and moist.  Frontal / maxillary sinuses non-tender. Nares patent without purulence or edema. Bilateral TMs clear without erythema, effusion or bulging. Oropharynx clear without erythema, exudates, edema or asymmetry.  Eyes: Conjunctivae and EOM are normal. Pupils are equal, round, and reactive to light. Right eye exhibits no discharge. Left eye exhibits no discharge.  Neck: Normal range of motion. Neck  supple. No thyromegaly present.  Cardiovascular: Normal rate, regular rhythm, normal heart sounds and intact distal pulses.  No murmur heard. Pulmonary/Chest: Effort normal and breath sounds normal. No respiratory distress. She has no wheezes. She has no rales.  Abdominal: Soft. Bowel sounds are normal. She exhibits no distension and no mass. There is no tenderness.  Musculoskeletal: Normal range of motion. She exhibits no edema or tenderness.  Upper / Lower Extremities: - Normal muscle tone, strength bilateral upper extremities 5/5, lower extremities 5/5  Lymphadenopathy:    She has no cervical adenopathy.  Neurological: She is alert and oriented to person, place, and time.  Distal sensation intact to light touch all extremities  Skin: Skin is warm and dry.  No rash noted. She is not diaphoretic. No erythema.  Psychiatric: She has a normal mood and affect. Her behavior is normal.  Well groomed, good eye contact, normal speech and thoughts  Nursing note and vitals reviewed.  Results for orders placed or performed during the hospital encounter of 09/10/17  CBC with Differential/Platelet  Result Value Ref Range   WBC 4.6 3.6 - 11.0 K/uL   RBC 5.24 (H) 3.80 - 5.20 MIL/uL   Hemoglobin 12.2 12.0 - 16.0 g/dL   HCT 37.2 35.0 - 47.0 %   MCV 70.9 (L) 80.0 - 100.0 fL   MCH 23.3 (L) 26.0 - 34.0 pg   MCHC 32.8 32.0 - 36.0 g/dL   RDW 16.0 (H) 11.5 - 14.5 %   Platelets 233 150 - 440 K/uL   Neutrophils Relative % 47 %   Neutro Abs 2.1 1.4 - 6.5 K/uL   Lymphocytes Relative 36 %   Lymphs Abs 1.7 1.0 - 3.6 K/uL   Monocytes Relative 12 %   Monocytes Absolute 0.5 0.2 - 0.9 K/uL   Eosinophils Relative 4 %   Eosinophils Absolute 0.2 0 - 0.7 K/uL   Basophils Relative 1 %   Basophils Absolute 0.1 0 - 0.1 K/uL  Lipid panel  Result Value Ref Range   Cholesterol 156 0 - 200 mg/dL   Triglycerides 71 <150 mg/dL   HDL 39 (L) >40 mg/dL   Total CHOL/HDL Ratio 4.0 RATIO   VLDL 14 0 - 40 mg/dL   LDL Cholesterol 103 (H) 0 - 99 mg/dL  Hemoglobin A1c  Result Value Ref Range   Hgb A1c MFr Bld 5.5 4.8 - 5.6 %   Mean Plasma Glucose 111.15 mg/dL  TSH  Result Value Ref Range   TSH 1.572 0.350 - 4.500 uIU/mL  VITAMIN D 25 Hydroxy (Vit-D Deficiency, Fractures)  Result Value Ref Range   Vit D, 25-Hydroxy 23.8 (L) 30.0 - 100.0 ng/mL  Comprehensive metabolic panel  Result Value Ref Range   Sodium 133 (L) 135 - 145 mmol/L   Potassium 4.0 3.5 - 5.1 mmol/L   Chloride 103 101 - 111 mmol/L   CO2 25 22 - 32 mmol/L   Glucose, Bld 105 (H) 65 - 99 mg/dL   BUN 10 6 - 20 mg/dL   Creatinine, Ser 0.89 0.44 - 1.00 mg/dL   Calcium 8.8 (L) 8.9 - 10.3 mg/dL   Total Protein 7.3 6.5 - 8.1 g/dL   Albumin 4.0 3.5 - 5.0 g/dL   AST 24 15 - 41 U/L   ALT 17 14 - 54 U/L   Alkaline  Phosphatase 59 38 - 126 U/L   Total Bilirubin 0.6 0.3 - 1.2 mg/dL   GFR calc non Af Amer >60 >60 mL/min   GFR calc  Af Amer >60 >60 mL/min   Anion gap 5 5 - 15      Assessment & Plan:   Problem List Items Addressed This Visit    Hypertension    Mildly elevated initial BP, repeat manual check improved but still borderline elevated. - Home BP readings more elevated recently up to 144/31-540 No known complications    Plan:  1. INCREASE Amlodipine from 5 to 39m - double current dose, then if improved on home checks notify office we can send new rx 133mdaily in future 2. Encourage improved lifestyle - low sodium diet, regular exercise 3. Continue monitor BP outside office, bring readings to next visit, if persistently >140/90 or new symptoms notify office sooner 4. Follow-up sooner if needed consider add 2nd agent if need, should provide update to clinic - otherwise plan on 6 month if stable      Iron deficiency anemia secondary to inadequate dietary iron intake    Suspect multifactorial etiology may be dietary and also history of menorrhagia Seems improved now Hgb back to normal range >12 Still low MCV Defer iron treatment at this time, asymptomatic Will trend, in future consider iron studies      Snoring    Possibly less likely after recent ENT evaluation Discussed may benefit from mouth guard or dental appliance to reduce snoring Counseling wt loss Follow-up if worsening symptoms consider repeat screening / referral if need      Vitamin D deficiency    Stable still low mildly 23.8 Advised can either do 5k daily for 8 weeks or continue OTC 2k daily for maintenance       Other Visit Diagnoses    Annual physical exam    -  Primary UTD Health Maintenance Encourage continue improving healthy lifestyle, diet, exercise, wt loss w/ BMI >37    Environmental and seasonal allergies       Relevant Medications   loratadine (CLARITIN) 10 MG tablet   Screening for breast cancer        [facility], she will contact them to schedule/record release, should get mammo q 1-2 years until 48 then yearly   Relevant Orders   MM DIGITAL SCREENING BILATERAL      Meds ordered this encounter  Medications  . loratadine (CLARITIN) 10 MG tablet    Sig: Take 1 tablet (10 mg total) by mouth daily. Use for 4-6 weeks then stop, and use as needed or seasonally    Dispense:  90 tablet    Refill:  3    Follow up plan: Return in about 6 months (around 03/14/2018) for HTN.  AlNobie PutnamDOCentraledical Group 09/14/2017, 5:34 PM

## 2017-10-02 DIAGNOSIS — J301 Allergic rhinitis due to pollen: Secondary | ICD-10-CM | POA: Diagnosis not present

## 2017-10-02 DIAGNOSIS — L272 Dermatitis due to ingested food: Secondary | ICD-10-CM | POA: Diagnosis not present

## 2017-10-02 DIAGNOSIS — L242 Irritant contact dermatitis due to solvents: Secondary | ICD-10-CM | POA: Diagnosis not present

## 2017-10-08 ENCOUNTER — Encounter: Payer: Self-pay | Admitting: Family Medicine

## 2017-10-08 DIAGNOSIS — A6000 Herpesviral infection of urogenital system, unspecified: Secondary | ICD-10-CM

## 2017-10-09 ENCOUNTER — Other Ambulatory Visit: Payer: Self-pay

## 2017-10-09 MED ORDER — VALACYCLOVIR HCL 500 MG PO TABS: 500.0000 mg | ORAL_TABLET | Freq: Two times a day (BID) | ORAL | 5 refills | Status: DC

## 2017-10-17 ENCOUNTER — Other Ambulatory Visit: Payer: Self-pay | Admitting: Family Medicine

## 2017-10-17 ENCOUNTER — Telehealth: Payer: Self-pay | Admitting: Family Medicine

## 2017-10-17 DIAGNOSIS — I10 Essential (primary) hypertension: Secondary | ICD-10-CM

## 2017-10-17 MED ORDER — AMLODIPINE BESYLATE 10 MG PO TABS: 10.0000 mg | ORAL_TABLET | Freq: Every day | ORAL | 3 refills | Status: DC

## 2017-10-17 NOTE — Telephone Encounter (Signed)
Pt needs a refill on amlodipine sent to [facility].  Her call back number is [phone]

## 2017-10-17 NOTE — Telephone Encounter (Signed)
Rx send

## 2017-10-22 ENCOUNTER — Other Ambulatory Visit: Payer: Self-pay | Admitting: Family Medicine

## 2017-10-22 ENCOUNTER — Ambulatory Visit
Admission: RE | Admit: 2017-10-22 | Discharge: 2017-10-22 | Disposition: A | Discharge status: Home / Self Care | Payer: 59 | Source: Ambulatory Visit | Attending: Family Medicine | Admitting: Family Medicine

## 2017-10-22 DIAGNOSIS — R928 Other abnormal and inconclusive findings on diagnostic imaging of breast: Secondary | ICD-10-CM | POA: Insufficient documentation

## 2017-10-22 DIAGNOSIS — N6310 Unspecified lump in the right breast, unspecified quadrant: Secondary | ICD-10-CM | POA: Diagnosis not present

## 2017-10-22 DIAGNOSIS — Z1231 Encounter for screening mammogram for malignant neoplasm of breast: Secondary | ICD-10-CM | POA: Diagnosis not present

## 2017-10-22 DIAGNOSIS — Z1239 Encounter for other screening for malignant neoplasm of breast: Secondary | ICD-10-CM

## 2017-10-24 ENCOUNTER — Other Ambulatory Visit: Payer: Self-pay | Admitting: Family Medicine

## 2017-10-24 DIAGNOSIS — N6310 Unspecified lump in the right breast, unspecified quadrant: Secondary | ICD-10-CM

## 2017-10-24 DIAGNOSIS — R928 Other abnormal and inconclusive findings on diagnostic imaging of breast: Secondary | ICD-10-CM

## 2017-10-30 DIAGNOSIS — J301 Allergic rhinitis due to pollen: Secondary | ICD-10-CM | POA: Diagnosis not present

## 2017-11-01 DIAGNOSIS — J305 Allergic rhinitis due to food: Secondary | ICD-10-CM | POA: Diagnosis not present

## 2017-11-01 DIAGNOSIS — J301 Allergic rhinitis due to pollen: Secondary | ICD-10-CM | POA: Diagnosis not present

## 2017-11-06 DIAGNOSIS — J301 Allergic rhinitis due to pollen: Secondary | ICD-10-CM | POA: Diagnosis not present

## 2017-11-08 ENCOUNTER — Ambulatory Visit
Admission: RE | Admit: 2017-11-08 | Discharge: 2017-11-08 | Disposition: A | Discharge status: Home / Self Care | Payer: 59 | Source: Ambulatory Visit | Attending: Family Medicine | Admitting: Family Medicine

## 2017-11-08 DIAGNOSIS — R922 Inconclusive mammogram: Secondary | ICD-10-CM | POA: Diagnosis not present

## 2017-11-08 DIAGNOSIS — N6310 Unspecified lump in the right breast, unspecified quadrant: Secondary | ICD-10-CM | POA: Diagnosis not present

## 2017-11-08 DIAGNOSIS — N6312 Unspecified lump in the right breast, upper inner quadrant: Secondary | ICD-10-CM | POA: Diagnosis not present

## 2017-11-08 DIAGNOSIS — R928 Other abnormal and inconclusive findings on diagnostic imaging of breast: Secondary | ICD-10-CM

## 2017-11-09 ENCOUNTER — Other Ambulatory Visit: Payer: Self-pay | Admitting: Family Medicine

## 2017-11-09 DIAGNOSIS — J301 Allergic rhinitis due to pollen: Secondary | ICD-10-CM | POA: Diagnosis not present

## 2017-11-09 DIAGNOSIS — N6310 Unspecified lump in the right breast, unspecified quadrant: Secondary | ICD-10-CM

## 2017-11-09 DIAGNOSIS — R928 Other abnormal and inconclusive findings on diagnostic imaging of breast: Secondary | ICD-10-CM

## 2017-11-13 DIAGNOSIS — J301 Allergic rhinitis due to pollen: Secondary | ICD-10-CM | POA: Diagnosis not present

## 2017-11-19 DIAGNOSIS — J301 Allergic rhinitis due to pollen: Secondary | ICD-10-CM | POA: Diagnosis not present

## 2017-11-22 DIAGNOSIS — J301 Allergic rhinitis due to pollen: Secondary | ICD-10-CM | POA: Diagnosis not present

## 2017-11-23 ENCOUNTER — Ambulatory Visit
Admission: RE | Admit: 2017-11-23 | Discharge: 2017-11-23 | Disposition: A | Discharge status: Home / Self Care | Payer: 59 | Source: Ambulatory Visit | Attending: Family Medicine | Admitting: Family Medicine

## 2017-11-23 DIAGNOSIS — R928 Other abnormal and inconclusive findings on diagnostic imaging of breast: Secondary | ICD-10-CM | POA: Insufficient documentation

## 2017-11-23 DIAGNOSIS — N6310 Unspecified lump in the right breast, unspecified quadrant: Secondary | ICD-10-CM | POA: Insufficient documentation

## 2017-11-23 DIAGNOSIS — N6312 Unspecified lump in the right breast, upper inner quadrant: Secondary | ICD-10-CM | POA: Diagnosis not present

## 2017-11-23 DIAGNOSIS — D241 Benign neoplasm of right breast: Secondary | ICD-10-CM | POA: Diagnosis not present

## 2017-11-23 HISTORY — PX: BREAST BIOPSY: SHX20

## 2017-11-26 LAB — SURGICAL PATHOLOGY

## 2017-11-27 DIAGNOSIS — J301 Allergic rhinitis due to pollen: Secondary | ICD-10-CM | POA: Diagnosis not present

## 2017-11-27 DIAGNOSIS — B360 Pityriasis versicolor: Secondary | ICD-10-CM | POA: Diagnosis not present

## 2017-11-29 DIAGNOSIS — J301 Allergic rhinitis due to pollen: Secondary | ICD-10-CM | POA: Diagnosis not present

## 2017-11-30 DIAGNOSIS — J301 Allergic rhinitis due to pollen: Secondary | ICD-10-CM | POA: Diagnosis not present

## 2017-12-04 DIAGNOSIS — J301 Allergic rhinitis due to pollen: Secondary | ICD-10-CM | POA: Diagnosis not present

## 2017-12-11 DIAGNOSIS — J301 Allergic rhinitis due to pollen: Secondary | ICD-10-CM | POA: Diagnosis not present

## 2017-12-14 DIAGNOSIS — J301 Allergic rhinitis due to pollen: Secondary | ICD-10-CM | POA: Diagnosis not present

## 2017-12-18 DIAGNOSIS — J301 Allergic rhinitis due to pollen: Secondary | ICD-10-CM | POA: Diagnosis not present

## 2017-12-21 DIAGNOSIS — J301 Allergic rhinitis due to pollen: Secondary | ICD-10-CM | POA: Diagnosis not present

## 2017-12-25 DIAGNOSIS — J301 Allergic rhinitis due to pollen: Secondary | ICD-10-CM | POA: Diagnosis not present

## 2017-12-26 ENCOUNTER — Encounter: Payer: Self-pay | Admitting: Family Medicine

## 2017-12-26 DIAGNOSIS — I10 Essential (primary) hypertension: Secondary | ICD-10-CM

## 2017-12-26 DIAGNOSIS — E669 Obesity, unspecified: Secondary | ICD-10-CM

## 2017-12-26 NOTE — Addendum Note (Signed)
Addended by: Olin Hauser on: 12/26/2017 04:47 PM   Modules accepted: Orders

## 2017-12-28 DIAGNOSIS — J301 Allergic rhinitis due to pollen: Secondary | ICD-10-CM | POA: Diagnosis not present

## 2017-12-31 DIAGNOSIS — J301 Allergic rhinitis due to pollen: Secondary | ICD-10-CM | POA: Diagnosis not present

## 2018-01-04 ENCOUNTER — Encounter: Payer: Self-pay | Admitting: Family Medicine

## 2018-01-08 ENCOUNTER — Encounter: Payer: Self-pay | Admitting: Family Medicine

## 2018-01-08 ENCOUNTER — Ambulatory Visit (INDEPENDENT_AMBULATORY_CARE_PROVIDER_SITE_OTHER): Payer: 59 | Admitting: Family Medicine

## 2018-01-08 VITALS — BP 138/93 | HR 87 | Temp 99.0°F | Resp 16 | Ht 66.0 in | Wt 236.0 lb

## 2018-01-08 DIAGNOSIS — K5229 Other allergic and dietetic gastroenteritis and colitis: Secondary | ICD-10-CM

## 2018-01-08 DIAGNOSIS — R109 Unspecified abdominal pain: Secondary | ICD-10-CM | POA: Diagnosis not present

## 2018-01-08 DIAGNOSIS — R140 Abdominal distension (gaseous): Secondary | ICD-10-CM

## 2018-01-08 MED ORDER — DICYCLOMINE HCL 10 MG PO CAPS: 10.0000 mg | ORAL_CAPSULE | Freq: Three times a day (TID) | ORAL | 1 refills | Status: DC

## 2018-01-08 NOTE — Patient Instructions (Addendum)
Thank you for coming to the office today.  Concern for food allergy and possibly IBS contributing to symptoms  Try dicyclomine (bentyl) for cramping before meals and bedtime as needed - if effective, let me know and we can rx more  May benefit from specialized nutritionist and possibly GI or subspeciality for future management  For Constipation (less frequent bowel movement that can be hard dry or involve straining).  Recommend trying OTC Miralax 17g = 1 capful in large glass water once daily for now, try several days to see if working, goal is soft stool or BM 1-2 times daily, if too loose then reduce dose or try every other day. If not effective may need to increase it to 2 doses at once in AM or may do 1 in morning and 1 in afternoon/evening  - This medicine is very safe and can be used often without any problem and will not make you dehydrated. It is good for use on AS NEEDED BASIS or even MAINTENANCE therapy for longer term for several days to weeks at a time to help regulate bowel movements  Other more natural remedies or preventative treatment: - Increase hydration with water - Increase fiber in diet (high fiber foods = vegetables, leafy greens, oats/grains) - May take OTC Fiber supplement (metamucil powder or pill/gummy) - May try OTC Probiotic  ----------------- You call the [facility]   Address: [address] [place], [place], [place] [address] Phone: [phone]  See if they can see you and ask about specialist diet for your allergies  -------------- Let me know if I need to start the next step - call GI office and check what they have to offer or where we can refer you to for other specialized care.  Please schedule a Follow-up Appointment to: Return if symptoms worsen or fail to improve, for abdominal symptoms.  If you have any other questions or concerns, please feel free to call the office or send a message through [facility]. You may also  schedule an earlier appointment if necessary.  Additionally, you may be receiving a survey about your experience at our office within a few days to 1 week by e-mail or mail. We value your feedback.  [name], DO [facility]

## 2018-01-08 NOTE — Progress Notes (Signed)
Subjective:    Patient ID: Linda Vega, female    DOB: Jul 08, 1976, 42 y.o.   MRN: [id number]  Linda Vega is a 42 y.o. female presenting on 01/08/2018 for Constipation (gas pain causes mid lower back and abdominal pain )   HPI   ABDOMINAL DISCOMFORT / Bloating Gas / Constipation / FOOD ALLERGY - Significant recent history she has been followed by [facility] Dr Kathyrn Sheriff for Allergies, including food allergy testing, her results were positive for various food allergies including hazelnut, shellfish, chicken, pork, carrot, green pea among others different strengths, she was advised to try food avoidance - Now today reports new concerns about persistent symptoms that started >1 year ago, she did not focus exactly on it at onset, overall about same symptoms, but she is now more aware of it, with increased belching, lower abdominal discomfort, sometimes AM and PM with lower back pain generalized. She admits having BM once daily variety of stool types usually well formed stool, occasionally watery with mucus associated, does have some associated cramping when has to have BM, will have some cramping symptoms after and has difficulty feeling "not completely empty" after BM, still has "some urge to go to BM" but cannot go. - Admits back discomfort keeps her awake up - Her symptoms in general seem to occur about everyday, multiple times, worse after meals, she is not having worse symptoms when hungry or before meal - Admits increased gas production and abdominal distention at times, worse eating certain foods - Very rarely has nausea - Tried warm compresses with some relief of bloating, not tried any OTC meds for gas-x or pepto - Denies active abdominal pain, rectal bleeding, dark stools, vomiting, loss of appetite, fevers, chills, sweats   Depression screen [facility] 2/9 01/08/2018 09/14/2017 08/03/2017  Decreased Interest 0 0 0  Down, Depressed, Hopeless 0 0 0  PHQ - 2 Score 0 0 0    Social  History   Tobacco Use  . Smoking status: Never Smoker  . Smokeless tobacco: Never Used  Substance Use Topics  . Alcohol use: No    Frequency: Never  . Drug use: No    Review of Systems Per HPI unless specifically indicated above     Objective:    BP (!) 138/93   Pulse 87   Temp 99 F (37.2 C) (Oral)   Resp 16   Ht 5' 6"  (1.676 m)   Wt 236 lb (107 kg)   BMI 38.09 kg/m   Wt Readings from Last 3 Encounters:  01/08/18 236 lb (107 kg)  09/14/17 231 lb (104.8 kg)  08/03/17 236 lb 3.2 oz (107.1 kg)    Physical Exam  Constitutional: She is oriented to person, place, and time. She appears well-developed and well-nourished. No distress.  Well-appearing, comfortable, cooperative  HENT:  Head: Normocephalic and atraumatic.  Mouth/Throat: Oropharynx is clear and moist.  Eyes: Conjunctivae are normal. Right eye exhibits no discharge. Left eye exhibits no discharge.  Cardiovascular: Normal rate.  Pulmonary/Chest: Effort normal.  Abdominal: Soft. Bowel sounds are normal. She exhibits no distension. There is no tenderness. There is no guarding.  Without localized abdominal tender or abnormality  Musculoskeletal: She exhibits no edema.  Neurological: She is alert and oriented to person, place, and time.  Skin: Skin is warm and dry. No rash noted. She is not diaphoretic. No erythema.  Psychiatric: She has a normal mood and affect. Her behavior is normal.  Well groomed, good eye contact, normal speech  and thoughts  Nursing note and vitals reviewed.      Assessment & Plan:   Problem List Items Addressed This Visit    None    Visit Diagnoses    Abdominal bloating with cramps    -  Primary   Relevant Medications   dicyclomine (BENTYL) 10 MG capsule   Gastrointestinal food allergy          Clinically most consistent with constellation of functional GI symptoms possibly may be secondary to multiple food allergies in addition to or as the primary cause. Prior food allergy testing  per [facility], see full report, variety of foods including chicken, pork and some vegetables, she has not done complete dietary avoidance and still experiences some symptoms, no prior GI work-up, no prior other testing to rule out or establish prior dx of IBS including Colonoscopy or other imaging. - Limited trial on medications in past - Benign abdomen, clinically not consistent with acute abdominal pathology  Plan Reassurance overall, discussed functional GI symptoms, and likely component of food allergy Recommend referral to Nutritionist for 2nd opinion and diet planning / avoidance, may check with [facility], if needed we can refer otherwise we can help locate other provider, may ultimately need formal GI consultation, if she request I can contact GI to discuss case and determine if she would need further testing/treatment or if other more specialized provider for extensive food allergies - Recommend general dietary advice, improve hydration, inc fiber diet/OTC supplement, trial Miralax OTC PRN if need for constipation., may try probiotic sample given - Trial on rx Dicyclomine PRN abdominal symptoms Follow-up if not improving as planned 02/2018 and can discuss referral  Meds ordered this encounter  Medications  . dicyclomine (BENTYL) 10 MG capsule    Sig: Take 1 capsule (10 mg total) by mouth 4 (four) times daily -  before meals and at bedtime. As needed for cramping    Dispense:  30 capsule    Refill:  1   Follow up plan: Return if symptoms worsen or fail to improve, for abdominal symptoms.  Keep already scheduled apt in 02/2018  [name], [facility] 01/08/2018, 10:20 PM

## 2018-01-11 ENCOUNTER — Encounter: Payer: Self-pay | Admitting: Family Medicine

## 2018-01-17 ENCOUNTER — Encounter: Payer: Self-pay | Admitting: Family Medicine

## 2018-01-17 DIAGNOSIS — N946 Dysmenorrhea, unspecified: Secondary | ICD-10-CM

## 2018-01-17 MED ORDER — IBUPROFEN 800 MG PO TABS: 800.0000 mg | ORAL_TABLET | Freq: Three times a day (TID) | ORAL | 3 refills | Status: DC | PRN

## 2018-01-17 NOTE — Addendum Note (Signed)
Addended by: Olin Hauser on: 01/17/2018 06:19 PM   Modules accepted: Orders

## 2018-01-18 DIAGNOSIS — J301 Allergic rhinitis due to pollen: Secondary | ICD-10-CM | POA: Diagnosis not present

## 2018-01-22 DIAGNOSIS — J301 Allergic rhinitis due to pollen: Secondary | ICD-10-CM | POA: Diagnosis not present

## 2018-02-18 ENCOUNTER — Other Ambulatory Visit: Payer: Self-pay | Admitting: Family Medicine

## 2018-02-18 DIAGNOSIS — R109 Unspecified abdominal pain: Secondary | ICD-10-CM

## 2018-02-18 DIAGNOSIS — R140 Abdominal distension (gaseous): Principal | ICD-10-CM

## 2018-02-19 DIAGNOSIS — Z3200 Encounter for pregnancy test, result unknown: Secondary | ICD-10-CM | POA: Diagnosis not present

## 2018-02-19 DIAGNOSIS — Z30430 Encounter for insertion of intrauterine contraceptive device: Secondary | ICD-10-CM | POA: Diagnosis not present

## 2018-02-19 DIAGNOSIS — Z113 Encounter for screening for infections with a predominantly sexual mode of transmission: Secondary | ICD-10-CM | POA: Diagnosis not present

## 2018-02-20 ENCOUNTER — Encounter: Payer: 59 | Attending: Family Medicine | Admitting: Dietician

## 2018-02-20 ENCOUNTER — Encounter: Payer: Self-pay | Admitting: Dietician

## 2018-02-20 VITALS — Ht 66.0 in | Wt 231.5 lb

## 2018-02-20 DIAGNOSIS — E6609 Other obesity due to excess calories: Secondary | ICD-10-CM

## 2018-02-20 DIAGNOSIS — I10 Essential (primary) hypertension: Secondary | ICD-10-CM | POA: Insufficient documentation

## 2018-02-20 DIAGNOSIS — Z6837 Body mass index (BMI) 37.0-37.9, adult: Secondary | ICD-10-CM | POA: Diagnosis not present

## 2018-02-20 DIAGNOSIS — Z713 Dietary counseling and surveillance: Secondary | ICD-10-CM | POA: Diagnosis not present

## 2018-02-20 DIAGNOSIS — E669 Obesity, unspecified: Secondary | ICD-10-CM | POA: Insufficient documentation

## 2018-02-20 NOTE — Patient Instructions (Signed)
   Reduce portions of higher calorie foods like starches and meats. OK to work on this a few foods at a time.   Increase portions of low-carb vegetables.   Monitor intake using MyFitnessPal, or calorieking.com  Continue to exercise regularly.   Consider choosing mostly low fodmap foods to see if GI symptoms improve.

## 2018-02-20 NOTE — Progress Notes (Signed)
Medical Nutrition Therapy: Visit start time: 3557  end time: 1630  Assessment:  Diagnosis: obesity Past medical history: HTN, possible food allergies per patient Psychosocial issues/ stress concerns: none  Preferred learning method:  [name] . Hands-on  Current weight: 231.5lbs  Height: 5'6" Medications, supplements: reconciled list in medical record  Progress and evaluation: Patient reports weight gain over past 3-4 years, but currently steady. Has reduced sugar and bread intake in attempt to lose weight, but has not yet noticed much change. She reports some GI cramping and bloating and thinks it could be related to food choices/ intake. She has shellfish allergy, and some reaction to chicken and eggs (hives) when eating large portions. Allergy testing shows class IV reaction to hazelnuts, and class 0 or I to peanuts, pecans, [place] nuts. Class II reaction to green peas, carrots, chicken.   Physical activity: cardiovascular 30-40 minutes, average 3x a week  Dietary Intake:  Usual eating pattern includes 3 meals and 2-3 snacks per day. Dining out frequency: 2 meals per week.  Breakfast: bagel/bread, bacon 2strips with 3 eggs boiled or scrambled, sometimes avocado, sometimes smoothie in addition to breakfast with fruit, flaxseed, coconut water, spinach; lg cup coffee with 2 Tbsp sugar and creamer daily, fruit Snack: fruit apple, bananas, cherries, etc Lunch: catered lunch 2x weekly; [place] on croissant or french bread or [employer]: not daily -- likely 4x a week chocolate, chips, candy Supper: pasta, baked chicken with bbq 3-4 pieces, not always with veg or starch; 4 tacos with corn tortilla, beef, lettuce, tomato, onion, cheese; usually eats leftovers next day Snack: cereal cocoa krispies, frosted flakes, corn pops with almond milk, or whole bag of popcorn Beverages: water, coffee with sugar and creamer  Nutrition Care Education: Topics covered: weight control, food allergies and  intolerances Basic nutrition: basic food groups, appropriate nutrient balance   Weight control: appropriate food choices and portions, importance of lowfat and low sugar foods, appropriate nutrient balance in meals, benefits of tracking food intake and options for tracking as well as importance of accurate input; discussed role of exercise.  Food allergies and intolerances:  Reviewed allergy testing results and unlikelihood of reaction to class I or II allergens unless eating large portions and/or frequently; discussed possible food intolerances causing GI symptoms and discussed fodmap diet; encouraged patient to consider eliminating foods by category and monitor effects on symptoms. Discussed other factors potentially influencing GI symptoms such as stress, menstrual cycle/ hormonal changes, etc.   Nutritional Diagnosis:  [place]-2.2 Altered nutrition-related laboratory As related to food allergies.  As evidenced by allergy testing results and patient report. [place]-1.4 Altered GI function As related to symptoms of cramping and bloating.  As evidenced by patient report. -3.3 Overweight/obesity As related to excess calories.  As evidenced by patient with BMI of 37.4, and patient report of dietary intake.  Intervention: Instruction and discussion as noted above.   Set goals with direction from patient.    Patient declined scheduling follow-up at this time; she will schedule later if needed.    Informed patient that MNT referrals are valid for one year.   Education Materials given:  Marland Kitchen Fodmap diet overview . Food lists/ Planning A Balanced Meal . Goals/ instructions   Learner/ who was taught:  . Patient   Level of understanding: Marland Kitchen Verbalizes/ demonstrates competency   Demonstrated degree of understanding via:   Teach back Learning barriers: . None  Willingness to learn/ readiness for change: . Eager, change in progress  Monitoring and  Evaluation:  Dietary intake, exercise, GI symptoms, and  body weight      follow up: prn

## 2018-03-11 ENCOUNTER — Ambulatory Visit (INDEPENDENT_AMBULATORY_CARE_PROVIDER_SITE_OTHER): Payer: 59 | Admitting: Family Medicine

## 2018-03-11 ENCOUNTER — Encounter: Payer: Self-pay | Admitting: Family Medicine

## 2018-03-11 VITALS — BP 117/70 | HR 86 | Temp 98.9°F | Resp 16 | Ht 66.0 in | Wt 232.0 lb

## 2018-03-11 DIAGNOSIS — I10 Essential (primary) hypertension: Secondary | ICD-10-CM | POA: Diagnosis not present

## 2018-03-11 DIAGNOSIS — M5441 Lumbago with sciatica, right side: Secondary | ICD-10-CM | POA: Diagnosis not present

## 2018-03-11 DIAGNOSIS — G8929 Other chronic pain: Secondary | ICD-10-CM

## 2018-03-11 DIAGNOSIS — M545 Low back pain, unspecified: Secondary | ICD-10-CM | POA: Insufficient documentation

## 2018-03-11 MED ORDER — MELOXICAM 15 MG PO TABS: 15.0000 mg | ORAL_TABLET | Freq: Every day | ORAL | 1 refills | Status: DC | PRN

## 2018-03-11 MED ORDER — CYCLOBENZAPRINE HCL 10 MG PO TABS: 5.0000 mg | ORAL_TABLET | Freq: Three times a day (TID) | ORAL | 1 refills | Status: DC | PRN

## 2018-03-11 NOTE — Assessment & Plan Note (Addendum)
Subacute on chronic R LBP with associated R sciatica. Suspect likely due to muscle spasm/strain, without known injury or trauma.  No known OA/DJD - No red flag symptoms. Negative SLR for radiculopathy - Inadequate conservative therapy - some improvement  Seems unrelated to her chronic GI symptoms  Plan: 1. Start anti-inflammatory trial with rx Meloxicam 87m daily wc x 2-4 weeks, then PRN 2. Start muscle relaxant with Flexeril 160mtabs - take 5-1055mp to TID PRN, titrate up as tolerated 3. May use Tylenol PRN for breakthrough 4. Encouraged use of heating pad 1-2x daily for now then PRN 5. Follow-up 4-6 weeks if not improved for re-evaluation, consider X-ray imaging, trial of PT, and possibly referral to Orthopedic - also reviewed possible other meds such as gabapentin, prednisone if need for sciatica in future

## 2018-03-11 NOTE — Progress Notes (Signed)
Subjective:    Patient ID: Linda Vega, female    DOB: [birth year], 42 y.o.   MRN: [id number]  Linda Vega is a 42 y.o. female presenting on 03/11/2018 for Hypertension (patient is concerned abot nodule Left foot sole onset week and right hand onset couple of months)   HPI   CHRONIC HTN: Last visit 08/2017 for annual phys, treated BP with increased Amlodipine from 5 to 36m daily, see note for background information. - Interval update, home BP readings with back in 09/2017 she had 130-140 / 70-80, recent readings not recorded by she reports normal - Today reports doing well on current med Current Meds -Amlodipine 148mReports good compliance, took meds today. Tolerating well, w/o complaints. Denies CP, dyspnea, HA, edema, dizziness / lightheadedness   Follow-up Abdominal Discomfort / Functional GI symptoms - Taking Dicyclomine 1054mRN usually only taking one every few days, with abdominal cramping having good results. Would like to keep taking.  Low Back Pain - Last visit 12/2017 - focused on abdominal symptoms with treatment of dicyclomine, she thinks back symptoms are related to abdominal concerns. - Now returns for focus on back - has had persistent issue >3 months now, some worsening admits some episodes of radiating back pain and discomfort R sided radiating down her right leg, with some tingling into foot as well. Symptoms seem to vary, may last 20 min or less, and can cause at times during day and makes her stand, and also occurs at night and is worse at night can trigger. Sometimes improved with ambulation after walking around. - Improved with laying on a pillow - Tried OTC Ibuprofen and Aleve with intermittent use without significant improvement. Tried Tylenol. She does not like taking medicine for it. No side effects, just preference - No prior imaging or X-ray of back pain. Never had PT. - No known History of lumbar OA/DJD, no prior back surgery - Admits difficulty  sleeping due to pain - Denies any fevers/chills, numbness, tingling, weakness, loss of control bladder/bowel incontinence or retention, unintentional wt loss, night sweats  Additional question with possible nodules in Left foot medial heel, and Right forearm   [facility] 03/11/2018 02/20/2018 01/08/2018  Decreased Interest 0 0 0  Down, Depressed, Hopeless 0 0 0  PHQ - 2 Score 0 0 0    Social History   Tobacco Use  . Smoking status: Never Smoker  . Smokeless tobacco: Never Used  Substance Use Topics  . Alcohol use: Yes    Frequency: Never    Comment: occasional, about 3x a month  . Drug use: No    Review of Systems Per HPI unless specifically indicated above    Objective:    BP 117/70   Pulse 86   Temp 98.9 F (37.2 C) (Oral)   Resp 16   Ht 5' 6"  (1.676 m)   Wt 232 lb (105.2 kg)   BMI 37.45 kg/m   Wt Readings from Last 3 Encounters:  03/11/18 232 lb (105.2 kg)  02/20/18 231 lb 8 oz (105 kg)  01/08/18 236 lb (107 kg)    Physical Exam  Constitutional: She is oriented to person, place, and time. She appears well-developed and well-nourished. No distress.  Well-appearing, comfortable, cooperative  HENT:  Head: Normocephalic and atraumatic.  Mouth/Throat: Oropharynx is clear and moist.  Eyes: Conjunctivae are normal. Right eye exhibits no discharge. Left eye exhibits no discharge.  Neck: Normal range of motion. Neck supple. No thyromegaly present.  Cardiovascular: Normal rate, regular rhythm, normal heart sounds and intact distal pulses.  No murmur heard. Pulmonary/Chest: Effort normal and breath sounds normal. No respiratory distress. She has no wheezes. She has no rales.  Musculoskeletal: Normal range of motion. She exhibits no edema.  Low Back Inspection: Normal appearance, Large body habitus, no spinal deformity, symmetrical. Palpation: No tenderness over spinous processes. R lower lumbar paraspinal and into gluteal region with some muscle  hypertonicity no significant tender ROM: Full active ROM forward flex / back extension, rotation L/R without discomfort Special Testing: Seated SLR negative for radicular pain bilaterally  Strength: Bilateral hip flex/ext 5/5, knee flex/ext 5/5, ankle dorsiflex/plantarflex 5/5 Neurovascular: intact distal sensation to light touch   Lymphadenopathy:    She has no cervical adenopathy.  Neurological: She is alert and oriented to person, place, and time.  Skin: Skin is warm and dry. No rash noted. She is not diaphoretic. No erythema.  Right wrist inner aspect with palpable 1-2 cm soft mobile nodule consistent with lipoma, non tender  Left plantar aspect of inner foot with possible deeper callus formation vs nodular density, non tender  Psychiatric: She has a normal mood and affect. Her behavior is normal.  Well groomed, good eye contact, normal speech and thoughts  Nursing note and vitals reviewed.      Assessment & Plan:   Problem List Items Addressed This Visit    Chronic right-sided low back pain with right-sided sciatica - Primary    Subacute on chronic R LBP with associated R sciatica. Suspect likely due to muscle spasm/strain, without known injury or trauma.  No known OA/DJD - No red flag symptoms. Negative SLR for radiculopathy - Inadequate conservative therapy - some improvement  Seems unrelated to her chronic GI symptoms  Plan: 1. Start anti-inflammatory trial with rx Meloxicam 29m daily wc x 2-4 weeks, then PRN 2. Start muscle relaxant with Flexeril 118mtabs - take 5-1025mp to TID PRN, titrate up as tolerated 3. May use Tylenol PRN for breakthrough 4. Encouraged use of heating pad 1-2x daily for now then PRN 5. Follow-up 4-6 weeks if not improved for re-evaluation, consider X-ray imaging, trial of PT, and possibly referral to Orthopedic - also reviewed possible other meds such as gabapentin, prednisone if need for sciatica in future       Relevant Medications    meloxicam (MOBIC) 15 MG tablet   cyclobenzaprine (FLEXERIL) 10 MG tablet   Essential hypertension    Well-controlled HTN - Home BP readings improved, infrequent recently  No known complications   Plan:  1. Continue current BP regimen - Amlodipine 80m76mily 2. Encourage improved lifestyle - low sodium diet, regular exercise 3. Continue monitor BP outside office, bring readings to next visit, if persistently >140/90 or new symptoms notify office sooner 4. Follow-up 6 months - yearly 08/2018          Meds ordered this encounter  Medications  . meloxicam (MOBIC) 15 MG tablet    Sig: Take 1 tablet (15 mg total) by mouth daily as needed for pain.    Dispense:  30 tablet    Refill:  1  . cyclobenzaprine (FLEXERIL) 10 MG tablet    Sig: Take 0.5-1 tablets (5-10 mg total) by mouth 3 (three) times daily as needed for muscle spasms.    Dispense:  30 tablet    Refill:  1    Follow up plan: Return in about 6 weeks (around 04/22/2018) for back pain.  AlexNobie Putnam SL[place]  [facility] 03/11/2018, 5:40 PM

## 2018-03-11 NOTE — Assessment & Plan Note (Signed)
Well-controlled HTN - Home BP readings improved, infrequent recently  No known complications   Plan:  1. Continue current BP regimen - Amlodipine 69m daily 2. Encourage improved lifestyle - low sodium diet, regular exercise 3. Continue monitor BP outside office, bring readings to next visit, if persistently >140/90 or new symptoms notify office sooner 4. Follow-up 6 months - yearly 08/2018

## 2018-03-11 NOTE — Patient Instructions (Addendum)
Thank you for coming to the office today.  Blood pressure is well controlled Continue Amlodipine - no change today - Please go ahead and check BP few times a month to keep track, notify if >140/90  1. For your Back Pain - I think that this is due to Muscle Spasms or strain. Your Sciatic Nerve can be affected causing some of your radiation and numbness down your legs. 2. Start with anti-inflammatory Meloxicam 15 once daily with FOOD every day for next 1 to 2 weeks if helping, then can use only as needed 3. Start Cyclobenzapine (Flexeril) 54m tablets - cut in half for 544mat night for muscle relaxant - may make you sedated or sleepy (be careful driving or working on this) if tolerated you can take every 8 hours, half or whole tab 4. May use Tylenol Extra Str 50028mabs - may take 1-2 tablets every 6 hours as needed 5. Recommend to start using heating pad on your lower back 1-2x daily for few weeks  This pain may take weeks to months to fully resolve, but hopefully it will respond to the medicine initially. All back injuries (small or serious) are slow to heal since we use our back muscles every day. Be careful with turning, twisting, lifting, sitting / standing for prolonged periods, and avoid re-injury.  If your symptoms significantly worsen with more pain, or new symptoms with weakness in one or both legs, new or different shooting leg pains, numbness in legs or groin, loss of control or retention of urine or bowel movements, please call back for advice and you may need to go directly to the Emergency Department.  May need other medicines in future such as Gabapentin for nerve pain or Prednisone is acute worsening  Please schedule a Follow-up Appointment to: Return in about 6 weeks (around 04/22/2018) for back pain.  If you have any other questions or concerns, please feel free to call the office or send a message through [place]ou may also schedule an earlier appointment if  necessary.  Additionally, you may be receiving a survey about your experience at our office within a few days to 1 week by e-mail or mail. We value your feedback.  AleNobie PutnamO [facility]          Low Back Pain Exercises  See other page with pictures of each exercise.  Start with 1 or 2 of these exercises that you are most comfortable with. Do not do any exercises that cause you significant worsening pain. Some of these may cause some "stretching soreness" but it should go away after you stop the exercise, and get better over time. Gradually increase up to 3-4 exercises as tolerated.  Standing hamstring stretch: Place the heel of your leg on a stool about 15 inches high. Keep your knee straight. Lean forward, bending at the hips until you feel a mild stretch in the back of your thigh. Make sure you do not roll your shoulders and bend at the waist when doing this or you will stretch your lower back instead. Hold the stretch for 15 to 30 seconds. Repeat 3 times. Repeat the same stretch on your other leg.  Cat and camel: Get down on your hands and knees. Let your stomach sag, allowing your back to curve downward. Hold this position for 5 seconds. Then arch your back and hold for 5 seconds. Do 3 sets of 10.  Quadriped Arm/Leg Raises: Get down on  your hands and knees. Tighten your abdominal muscles to stiffen your spine. While keeping your abdominals tight, raise one arm and the opposite leg away from you. Hold this position for 5 seconds. Lower your arm and leg slowly and alternate sides. Do this 10 times on each side.  Pelvic tilt: Lie on your back with your knees bent and your feet flat on the floor. Tighten your abdominal muscles and push your lower back into the floor. Hold this position for 5 seconds, then relax. Do 3 sets of 10.  Partial curl: Lie on your back with your knees bent and your feet flat on the floor. Tighten your stomach muscles  and flatten your back against the floor. Tuck your chin to your chest. With your hands stretched out in front of you, curl your upper body forward until your shoulders clear the floor. Hold this position for 3 seconds. Don't hold your breath. It helps to breathe out as you lift your shoulders up. Relax. Repeat 10 times. Build to 3 sets of 10. To challenge yourself, clasp your hands behind your head and keep your elbows out to the side.  Lower trunk rotation: Lie on your back with your knees bent and your feet flat on the floor. Tighten your abdominal muscles and push your lower back into the floor. Keeping your shoulders down flat, gently rotate your legs to one side, then the other as far as you can. Repeat 10 to 20 times.  Single knee to chest stretch: Lie on your back with your legs straight out in front of you. Bring one knee up to your chest and grasp the back of your thigh. Pull your knee toward your chest, stretching your buttock muscle. Hold this position for 15 to 30 seconds and return to the starting position. Repeat 3 times on each side.  Double knee to chest: Lie on your back with your knees bent and your feet flat on the floor. Tighten your abdominal muscles and push your lower back into the floor. Pull both knees up to your chest. Hold for 5 seconds and repeat 10 to 20 times.

## 2018-03-15 ENCOUNTER — Ambulatory Visit: Payer: 59 | Admitting: Family Medicine

## 2018-03-19 DIAGNOSIS — Z30431 Encounter for routine checking of intrauterine contraceptive device: Secondary | ICD-10-CM | POA: Diagnosis not present

## 2018-03-29 ENCOUNTER — Ambulatory Visit (INDEPENDENT_AMBULATORY_CARE_PROVIDER_SITE_OTHER): Payer: 59 | Admitting: Family Medicine

## 2018-03-29 ENCOUNTER — Encounter: Payer: Self-pay | Admitting: Family Medicine

## 2018-03-29 VITALS — BP 135/90 | HR 94 | Temp 98.4°F | Resp 16 | Ht 65.0 in | Wt 235.0 lb

## 2018-03-29 DIAGNOSIS — G8929 Other chronic pain: Secondary | ICD-10-CM | POA: Diagnosis not present

## 2018-03-29 DIAGNOSIS — M5441 Lumbago with sciatica, right side: Secondary | ICD-10-CM

## 2018-03-29 MED ORDER — GABAPENTIN 100 MG PO CAPS: ORAL_CAPSULE | ORAL | 1 refills | Status: DC

## 2018-03-29 NOTE — Patient Instructions (Addendum)
Thank you for coming to the office today.  1. For your Back Pain - I think that this is due to Muscle Spasms or strain. Your Sciatic Nerve can be affected causing some of your radiation and numbness down your legs. 2. CONTINUE with anti-inflammatory Meloxicam 15 once daily with FOOD every day for next 1 to 2 weeks if helping, then can use only as needed  Start Gabapentin 177m capsules, take at night for 2-3 nights only, and then increase to 2 times a day for a few days, and then may increase to 3 times a day, it may make you drowsy, if helps significantly at night only, then you can increase instead to 3 capsules at night, instead of 3 times a day - In the future if needed, we can significantly increase the dose if tolerated well, some common doses are 3024mthree times a day up to 600106mhree times a day, usually it takes several weeks or months to get to higher doses  May still take Cyclobenzaprine or Flexeirl as needed - but we discussed less likely to be beneficial now  Continue Tylenol PRN  Recommend to start using heating pad on your lower back 1-2x daily for few weeks  Call if you need Prednisone treatment for not improved pain and radiating nerve pain  Call as well if you are interested in Physical Therapy - check cost first - [facility]  This pain may take weeks to months to fully resolve, but hopefully it will respond to the medicine initially. All back injuries (small or serious) are slow to heal since we use our back muscles every day. Be careful with turning, twisting, lifting, sitting / standing for prolonged periods, and avoid re-injury.  If your symptoms significantly worsen with more pain, or new symptoms with weakness in one or both legs, new or different shooting leg pains, numbness in legs or groin, loss of control or retention of urine or bowel movements, please call back for advice and you may need to go directly to  the Emergency Department.   Please schedule a Follow-up Appointment to: Return in about 4 weeks (around 04/26/2018), or if symptoms worsen or fail to improve, for low back pain.  If you have any other questions or concerns, please feel free to call the office or send a message through MyCSag [place]ou may also schedule an earlier appointment if necessary.  Additionally, you may be receiving a survey about your experience at our office within a few days to 1 week by e-mail or mail. We value your feedback.  Linda Vega

## 2018-03-29 NOTE — Progress Notes (Signed)
Subjective:    Patient ID: Linda Vega, female    DOB: 02/28/1976, 42 y.o.   MRN: [id number]  Linda Vega is a 42 y.o. female presenting on 03/29/2018 for Back Pain (R side sciatica)   HPI   FOLLOW-UP Low Back Pain, R sided with sciatica - Last visit with me 03/11/18, for same problem, treated with rx meloxicam and flexeril for low back pain flare, see prior notes for background information. - Today patient reports still having back pain, overall she is 60% improved from last visit, in past 2-3 weeks she was taking Meloxicam 16m daily with some relief, asking if potentially could increase dose or take 2, she thinks flexeril less effective, did not make her too sleepy - Still has flare up with back pain episodes can happen any time, not having constant back pain, flare may be regardless of activity sitting laying standing, will have some radiating pain down R leg still sciatica symptoms - No prior imaging or X-ray of back pain. Never had PT. - No known History of lumbar OA/DJD, no prior back surgery - Denies any fevers/chills, numbness, tingling, weakness, loss of control bladder/bowel incontinence or retention, unintentional wt loss, night sweats  [facility] 03/29/2018 03/11/2018 02/20/2018  Decreased Interest 0 0 0  Down, Depressed, Hopeless 0 0 0  PHQ - 2 Score 0 0 0    Social History   Tobacco Use  . Smoking status: Never Smoker  . Smokeless tobacco: Never Used  Substance Use Topics  . Alcohol use: Yes    Frequency: Never    Comment: occasional, about 3x a month  . Drug use: No    Review of Systems Per HPI unless specifically indicated above     Objective:    BP 135/90   Pulse 94   Temp 98.4 F (36.9 C) (Oral)   Resp 16   Ht 5' 5"  (1.651 m)   Wt 235 lb (106.6 kg)   BMI 39.11 kg/m   Wt Readings from Last 3 Encounters:  03/29/18 235 lb (106.6 kg)  03/11/18 232 lb (105.2 kg)  02/20/18 231 lb 8 oz (105 kg)    Physical Exam  Constitutional:  She is oriented to person, place, and time. She appears well-developed and well-nourished. No distress.  Well-appearing, comfortable, cooperative  HENT:  Head: Normocephalic and atraumatic.  Mouth/Throat: Oropharynx is clear and moist.  Eyes: Conjunctivae are normal. Right eye exhibits no discharge. Left eye exhibits no discharge.  Cardiovascular: Normal rate.  Pulmonary/Chest: Effort normal.  Musculoskeletal: She exhibits no edema.  Exam mostly unchanged since last visit 03/11/18  Low Back Inspection: Normal appearance, Large body habitus, no spinal deformity, symmetrical. Palpation: No tenderness over spinous processes. R lower lumbar paraspinal and into gluteal region with some muscle hypertonicity no significant tender ROM: Full active ROM forward flex / back extension, rotation L/R without discomfort Special Testing: Seated SLR negative for radicular pain bilaterally  Strength: Bilateral hip flex/ext 5/5, knee flex/ext 5/5, ankle dorsiflex/plantarflex 5/5 Neurovascular: intact distal sensation to light touch  Neurological: She is alert and oriented to person, place, and time.  Skin: Skin is warm and dry. No rash noted. She is not diaphoretic. No erythema.  Psychiatric: She has a normal mood and affect. Her behavior is normal.  Well groomed, good eye contact, normal speech and thoughts  Nursing note and vitals reviewed.      Assessment & Plan:   Problem List Items Addressed This Visit  Chronic right-sided low back pain with right-sided sciatica - Primary    Persistent now more chronic R LBP with associated R sciatica Uncertain provoking factor No known OA/DJD - No red flag symptoms. Negative SLR for radiculopathy  Plan: 1. Discussion on alternative medication - reviewed options - START Gabapentin titration 128m nightly then TID or 3068mnightly - for more persistent pain and neuropathic symptoms - STOP Flexeril - may use again in future PRN, but seems less effective,  offered baclofen but declined - Continue Meloxicam 1574maily PRN - May use Tylenol PRN for breakthrough - Encouraged use of heating pad 1-2x daily for now then PRN  Follow-up 4 weeks if not improved for re-evaluation, consider X-ray imaging, trial of PT, and possibly referral to Orthopedic - also reviewed may need prednisone burst as well - may call sooner for this       Relevant Medications   gabapentin (NEURONTIN) 100 MG capsule      Meds ordered this encounter  Medications  . gabapentin (NEURONTIN) 100 MG capsule    Sig: Start 1 capsule daily, increase by 1 cap every 2-3 days as tolerated up to 3 times a day, or may take 3 at once in evening.    Dispense:  90 capsule    Refill:  1    Follow up plan: Return in about 4 weeks (around 04/26/2018), or if symptoms worsen or fail to improve, for low back pain.  AleNobie PutnamO [place] 03/29/2018, 6:22 PM

## 2018-03-29 NOTE — Assessment & Plan Note (Signed)
Persistent now more chronic R LBP with associated R sciatica Uncertain provoking factor No known OA/DJD - No red flag symptoms. Negative SLR for radiculopathy  Plan: 1. Discussion on alternative medication - reviewed options - START Gabapentin titration 129m nightly then TID or 3044mnightly - for more persistent pain and neuropathic symptoms - STOP Flexeril - may use again in future PRN, but seems less effective, offered baclofen but declined - Continue Meloxicam 1589maily PRN - May use Tylenol PRN for breakthrough - Encouraged use of heating pad 1-2x daily for now then PRN  Follow-up 4 weeks if not improved for re-evaluation, consider X-ray imaging, trial of PT, and possibly referral to Orthopedic - also reviewed may need prednisone burst as well - may call sooner for this

## 2018-04-23 ENCOUNTER — Telehealth: Payer: Self-pay

## 2018-04-23 NOTE — Telephone Encounter (Signed)
Please notify patient:  She was advised to hold Ibuprofen while taking Meloxicam, because these are both anti-inflammatories. She can only take one at a time of these types of medicines.  If she prefers while having menstrual cramping - she may STOP Meloxicam (or HOLD IT temporarily) and switch back to Ibuprofen as needed 400 to 800 up to 3 times a day as needed for pain / cramps.  Once she finishes Ibuprofen, she may RESTART Meloxicam as needed for her back if she prefers.  She may continue Cyclobenzaprine and Gabapentin if needed.  [name], [facility] 04/23/2018, 4:50 PM

## 2018-04-23 NOTE — Telephone Encounter (Signed)
Left message for patient to call back  

## 2018-04-23 NOTE — Telephone Encounter (Signed)
Patient is concerned about what to take for cramps during her menstrual cycle she was advised not to take Ibuprofen or Tylenol. She is taking Meloxicam, cyclobenzaprine and gabapentin for RLBP associated with Sciatica. Please suggest ?

## 2018-04-24 NOTE — Telephone Encounter (Signed)
The pt was notified. No questions or concerns. 

## 2018-05-08 ENCOUNTER — Other Ambulatory Visit: Payer: Self-pay | Admitting: Family Medicine

## 2018-05-08 DIAGNOSIS — R140 Abdominal distension (gaseous): Secondary | ICD-10-CM

## 2018-05-08 DIAGNOSIS — G8929 Other chronic pain: Secondary | ICD-10-CM

## 2018-05-08 DIAGNOSIS — M5441 Lumbago with sciatica, right side: Principal | ICD-10-CM

## 2018-05-08 DIAGNOSIS — R109 Unspecified abdominal pain: Secondary | ICD-10-CM

## 2018-05-09 ENCOUNTER — Other Ambulatory Visit: Payer: Self-pay | Admitting: Family Medicine

## 2018-05-09 DIAGNOSIS — R140 Abdominal distension (gaseous): Principal | ICD-10-CM

## 2018-05-09 DIAGNOSIS — R109 Unspecified abdominal pain: Secondary | ICD-10-CM

## 2018-06-04 DIAGNOSIS — G8929 Other chronic pain: Secondary | ICD-10-CM

## 2018-06-04 DIAGNOSIS — M5441 Lumbago with sciatica, right side: Principal | ICD-10-CM

## 2018-06-04 MED ORDER — CYCLOBENZAPRINE HCL 10 MG PO TABS: 10.0000 mg | ORAL_TABLET | Freq: Three times a day (TID) | ORAL | 1 refills | Status: DC | PRN

## 2018-07-19 ENCOUNTER — Other Ambulatory Visit: Payer: Self-pay | Admitting: Family Medicine

## 2018-07-19 DIAGNOSIS — M5441 Lumbago with sciatica, right side: Principal | ICD-10-CM

## 2018-07-19 DIAGNOSIS — R140 Abdominal distension (gaseous): Secondary | ICD-10-CM

## 2018-07-19 DIAGNOSIS — R109 Unspecified abdominal pain: Secondary | ICD-10-CM

## 2018-07-19 DIAGNOSIS — G8929 Other chronic pain: Secondary | ICD-10-CM

## 2018-08-19 DIAGNOSIS — A6000 Herpesviral infection of urogenital system, unspecified: Secondary | ICD-10-CM

## 2018-08-20 MED ORDER — VALACYCLOVIR HCL 500 MG PO TABS: 500.0000 mg | ORAL_TABLET | Freq: Two times a day (BID) | ORAL | 5 refills | Status: DC

## 2018-09-03 ENCOUNTER — Other Ambulatory Visit: Payer: Self-pay | Admitting: Nurse Practitioner

## 2018-09-03 DIAGNOSIS — R140 Abdominal distension (gaseous): Principal | ICD-10-CM

## 2018-09-03 DIAGNOSIS — R109 Unspecified abdominal pain: Secondary | ICD-10-CM

## 2018-09-10 NOTE — Telephone Encounter (Signed)
.  mychart

## 2018-09-12 MED FILL — DICYCLOMINE 10 MG CAPSULE: 10 | 15 days supply | Qty: 60 | Fill #0

## 2018-09-12 MED FILL — SHIPPING COST: 1 days supply | Qty: 1 | Fill #0

## 2018-09-12 MED FILL — VALACYCLOVIR HCL 500 MG TAB: 500 | 15 days supply | Qty: 30 | Fill #0

## 2018-10-15 DIAGNOSIS — H524 Presbyopia: Secondary | ICD-10-CM | POA: Diagnosis not present

## 2018-10-16 DIAGNOSIS — I10 Essential (primary) hypertension: Secondary | ICD-10-CM

## 2018-10-16 MED ORDER — AMLODIPINE BESYLATE 10 MG PO TABS: 10.0000 mg | ORAL_TABLET | Freq: Every day | ORAL | 3 refills | Status: DC

## 2018-11-04 MED FILL — DICYCLOMINE 10 MG CAPSULE: 10 | 45 days supply | Qty: 180 | Fill #1

## 2018-11-25 ENCOUNTER — Other Ambulatory Visit: Payer: Self-pay

## 2018-11-25 ENCOUNTER — Encounter: Payer: Self-pay | Admitting: Family Medicine

## 2018-11-25 ENCOUNTER — Ambulatory Visit (INDEPENDENT_AMBULATORY_CARE_PROVIDER_SITE_OTHER): Payer: 59 | Admitting: Family Medicine

## 2018-11-25 DIAGNOSIS — R140 Abdominal distension (gaseous): Secondary | ICD-10-CM | POA: Diagnosis not present

## 2018-11-25 DIAGNOSIS — I10 Essential (primary) hypertension: Secondary | ICD-10-CM | POA: Diagnosis not present

## 2018-11-25 DIAGNOSIS — R109 Unspecified abdominal pain: Secondary | ICD-10-CM

## 2018-11-25 NOTE — Progress Notes (Signed)
Virtual Visit via Telephone The purpose of this virtual visit is to provide medical care while limiting exposure to the novel coronavirus (COVID19) for both patient and office staff.  Consent was obtained for phone visit:  Yes.   Answered questions that patient had about telehealth interaction:  Yes.   I discussed the limitations, risks, security and privacy concerns of performing an evaluation and management service by telephone. I also discussed with the patient that there may be a patient responsible charge related to this service. The patient expressed understanding and agreed to proceed.  Patient Location: Home Provider Location: [facility])  ---------------------------------------------------------------------- Chief Complaint  Patient presents with  . Hypertension    S: Reviewed CMA documentation. I have called patient and gathered additional HPI as follows:  CHRONIC HTN / Rapid Heart Rate with stress/anxiety - Last visit with me for similar issue 02/2018, for same problem HTN, treated with continued Amlodipine, without changes, see prior notes for background information. - Interval update with question if amlodipine is causing her stomach upset and abdominal discomfort symptoms, she did a trial off of it temporarily and her symptoms improved but she resumed med - Today patient reports not checking her BP at home, has electronic cuff but does not usually trust it as much, prefers manual reading, in past had home readings 140-150s / 80-90s  Current Meds -Amlodipine 48m - Previously on Propranolol for migraine prophylaxis has been off of this for while now - Also regarding history of GI issues, she is still taking Dicyclomine, said her current symptoms are unclear if they are same as prior GI issues or if new  Admits anxiety and stress now more  Denies CP, dyspnea, HA, edema, dizziness / lightheadedness   Denies any high risk travel to areas of current  concern for COVID19. Denies any known or suspected exposure to person with or possibly with COVID19.  Denies any fevers, chills, sweats, body ache, cough, shortness of breath, sinus pain or pressure, headache, abdominal pain, diarrhea  Past Medical History:  Diagnosis Date  . Hypertension    Social History   Tobacco Use  . Smoking status: Never Smoker  . Smokeless tobacco: Never Used  Substance Use Topics  . Alcohol use: Yes    Frequency: Never    Comment: occasional, about 3x a month  . Drug use: No    Current Outpatient Medications:  .  cholecalciferol (VITAMIN D) 1000 units tablet, Take 1 tablet (1,000 Units total) by mouth daily., Disp: 90 tablet, Rfl: 3 .  cyclobenzaprine (FLEXERIL) 10 MG tablet, Take 1 tablet (10 mg total) by mouth 3 (three) times daily as needed for muscle spasms., Disp: 60 tablet, Rfl: 1 .  dicyclomine (BENTYL) 10 MG capsule, TAKE 1 CAPSULE BY MOUTH 4 TIMES DAILY - BEFORE MEALS AND AT BEDTIME AS NEEDED FOR CRAMPING, Disp: 60 capsule, Rfl: 3 .  eletriptan (RELPAX) 20 MG tablet, Take 1 tab when headache starts and can repeat 1 tab in 2 hours if headache persists, Disp: , Rfl:  .  gabapentin (NEURONTIN) 100 MG capsule, START 1 CAPSULE DAILY, INCREASE BY 1 CAP EVERY 2-3 DAYS AS TOLERATED UP TO 3 TIMES A DAY, OR MAY TAKE 3 AT ONCE IN EVENING., Disp: 90 capsule, Rfl: 0 .  ibuprofen (ADVIL,MOTRIN) 800 MG tablet, Take 1 tablet (800 mg total) by mouth every 8 (eight) hours as needed for headache, moderate pain or cramping., Disp: 30 tablet, Rfl: 3 .  ketoconazole (NIZORAL) 2 % cream, ,  Disp: , Rfl: 1 .  Levonorgestrel (LILETTA) 19.5 MCG/DAY IUD IUD, by Intrauterine route., Disp: , Rfl:  .  meloxicam (MOBIC) 15 MG tablet, TAKE 1 TABLET BY MOUTH DAILY AS NEEDED FOR PAIN., Disp: 30 tablet, Rfl: 2 .  valACYclovir (VALTREX) 500 MG tablet, Take 1 tablet (500 mg total) by mouth 2 (two) times daily. For 3 to 5 days, as needed for flare, Disp: 30 tablet, Rfl: 5  Depression  screen [facility] 2/9 03/29/2018 03/11/2018 02/20/2018  Decreased Interest 0 0 0  Down, Depressed, Hopeless 0 0 0  PHQ - 2 Score 0 0 0    GAD 7 : Generalized Anxiety Score 08/03/2017  Nervous, Anxious, on Edge 3  Control/stop worrying 2  Worry too much - different things 2  Trouble relaxing 0  Restless 0  Easily annoyed or irritable 2  Afraid - awful might happen 2  Total GAD 7 Score 11  Anxiety Difficulty Very difficult    -------------------------------------------------------------------------- O: No physical exam performed due to remote telephone encounter.  Lab results reviewed.  No results found for this or any previous visit (from the past 2160 hour(s)).  -------------------------------------------------------------------------- A&P:  Problem List Items Addressed This Visit    Essential hypertension - Primary    Previously controlled HTN, now awaiting further readings Question side effect GI on Amlodipine vs other functional GI symptoms - Home BP readings had improved, infrequent recent No known complications   Plan:  1. HOLD vs DC Amlodipine 43m daily for now - due to questionable side effect, monitor symptoms off med for 1-2 week, notify [place] mychart, check BP regularly, if symptoms side effects GI do not resolve then it was likely not medicine, can resume at 5 or 19mif still elevated BP. If symptoms do resolve, can be side effect of med, we can switch to BB such as metoprolol 25-5048mID at that time if ready. 2. Encourage improved lifestyle - low sodium diet, regular exercise 3. Continue monitor BP outside office, bring readings to next visit, if persistently >140/90 or new symptoms notify office sooner       Other Visit Diagnoses    Abdominal bloating with cramps        Continue Dicyclomine and lifestyle improvements, stress and diet Follow-up if need consider referral to GI   No orders of the defined types were placed in this encounter.   Follow-up: - Return  in 2-4 weeks HTN  Patient verbalizes understanding with the above medical recommendations including the limitation of remote medical advice.  Specific follow-up and call-back criteria were given for patient to follow-up or seek medical care more urgently if needed.   - Time spent in direct consultation with patient on phone: 13 minutes   AleNobie PutnamO [place] 11/25/2018, 2:38 PM

## 2018-11-25 NOTE — Assessment & Plan Note (Signed)
Previously controlled HTN, now awaiting further readings Question side effect GI on Amlodipine vs other functional GI symptoms - Home BP readings had improved, infrequent recent No known complications   Plan:  1. HOLD vs DC Amlodipine 27m daily for now - due to questionable side effect, monitor symptoms off med for 1-2 week, notify [place] mychart, check BP regularly, if symptoms side effects GI do not resolve then it was likely not medicine, can resume at 5 or 121mif still elevated BP. If symptoms do resolve, can be side effect of med, we can switch to BB such as metoprolol 25-5079mID at that time if ready. 2. Encourage improved lifestyle - low sodium diet, regular exercise 3. Continue monitor BP outside office, bring readings to next visit, if persistently >140/90 or new symptoms notify office sooner

## 2018-11-25 NOTE — Patient Instructions (Addendum)
Stop Amlodipine 67m daily - due to possible GI side effects.  Continue other meds.  Try to monitor course, including blood pressure if able.  MyChart message me in 2 weeks for updates and if need new BP med - we can try Metoprolol - twice day medicine for BP, Heart Rate/Anxiety, also works for migraine prevention as well to some degree.  If worsening BP and symptoms or other new concerns - schedule a virtual visit instead or inadditon to [employer] If you have any other questions or concerns, please feel free to call the office or send a message through MC[facility] You may also schedule an earlier appointment if necessary.  Additionally, you may be receiving a survey about your experience at our office within a few days to 1 week by e-mail or mail. We value your feedback.  ANobie Putnam DO [place]

## 2018-12-03 NOTE — Telephone Encounter (Signed)
Scheduled for apt 5/6  [name], [facility] 12/03/2018, 10:00 PM

## 2018-12-04 ENCOUNTER — Encounter: Payer: Self-pay | Admitting: Family Medicine

## 2018-12-04 ENCOUNTER — Ambulatory Visit (INDEPENDENT_AMBULATORY_CARE_PROVIDER_SITE_OTHER): Payer: 59 | Admitting: Family Medicine

## 2018-12-04 ENCOUNTER — Other Ambulatory Visit: Payer: Self-pay

## 2018-12-04 VITALS — BP 142/100

## 2018-12-04 DIAGNOSIS — R140 Abdominal distension (gaseous): Secondary | ICD-10-CM | POA: Diagnosis not present

## 2018-12-04 DIAGNOSIS — K582 Mixed irritable bowel syndrome: Secondary | ICD-10-CM

## 2018-12-04 DIAGNOSIS — I10 Essential (primary) hypertension: Secondary | ICD-10-CM | POA: Diagnosis not present

## 2018-12-04 DIAGNOSIS — K219 Gastro-esophageal reflux disease without esophagitis: Secondary | ICD-10-CM | POA: Diagnosis not present

## 2018-12-04 DIAGNOSIS — R109 Unspecified abdominal pain: Secondary | ICD-10-CM

## 2018-12-04 MED ORDER — OMEPRAZOLE 20 MG PO CPDR: 20.0000 mg | DELAYED_RELEASE_CAPSULE | Freq: Every day | ORAL | 2 refills | Status: DC

## 2018-12-04 NOTE — Progress Notes (Signed)
Virtual Visit via Telephone The purpose of this virtual visit is to provide medical care while limiting exposure to the novel coronavirus (COVID19) for both patient and office staff.  Consent was obtained for phone visit:  Yes.   Answered questions that patient had about telehealth interaction:  Yes.   I discussed the limitations, risks, security and privacy concerns of performing an evaluation and management service by telephone. I also discussed with the patient that there may be a patient responsible charge related to this service. The patient expressed understanding and agreed to proceed.  Patient Location: Home Provider Location: [facility])  ---------------------------------------------------------------------- Chief Complaint  Patient presents with  . Nausea    diarrhea, vomiting,belching, gassy and indigistion x 3 week    S: Reviewed CMA documentation. I have called patient and gathered additional HPI as follows:  Nausea without Vomiting / Mixed Constipation and Diarrhea / Bloating Gassy Indigestion - Known chronic history of IBS like symptoms years with mixed constipation diarrhea and functional symptoms, had prior GI workup 2013, had colonoscopy, negative. - Previously has been on dicyclomine with some benefit - Now reports that symptoms started 3 weeks ago with variety of GI symptoms initially with loose stool and diarrhea and constellation of symptoms was having loose BM up to x 3 a day - Her diet has remained unchanged overall.  Admits anxiety and stress now more Denies dark stool, blood in stool, fever chills sweats  CHRONIC HTN: Reports recently was taken off Amlodipine d/t concern possible GI side effects, she was taking 43m, after stop med, still had GI symptoms. Current Meds - none, has Amlodipine still at home   Denies CP, dyspnea, HA, edema, dizziness / lightheadedness   Denies any high risk travel to areas of current concern for  COVID19. Denies any known or suspected exposure to person with or possibly with COVID19.   Past Medical History:  Diagnosis Date  . Hypertension    Social History   Tobacco Use  . Smoking status: Never Smoker  . Smokeless tobacco: Never Used  Substance Use Topics  . Alcohol use: Yes    Frequency: Never    Comment: occasional, about 3x a month  . Drug use: No    Current Outpatient Medications:  .  dicyclomine (BENTYL) 10 MG capsule, TAKE 1 CAPSULE BY MOUTH 4 TIMES DAILY - BEFORE MEALS AND AT BEDTIME AS NEEDED FOR CRAMPING, Disp: 60 capsule, Rfl: 3 .  ibuprofen (ADVIL,MOTRIN) 800 MG tablet, Take 1 tablet (800 mg total) by mouth every 8 (eight) hours as needed for headache, moderate pain or cramping., Disp: 30 tablet, Rfl: 3 .  Levonorgestrel (LILETTA) 19.5 MCG/DAY IUD IUD, by Intrauterine route., Disp: , Rfl:  .  valACYclovir (VALTREX) 500 MG tablet, Take 1 tablet (500 mg total) by mouth 2 (two) times daily. For 3 to 5 days, as needed for flare, Disp: 30 tablet, Rfl: 5 .  eletriptan (RELPAX) 20 MG tablet, Take 1 tab when headache starts and can repeat 1 tab in 2 hours if headache persists, Disp: , Rfl:  .  ketoconazole (NIZORAL) 2 % cream, , Disp: , Rfl: 1 .  omeprazole (PRILOSEC) 20 MG capsule, Take 1 capsule (20 mg total) by mouth daily before breakfast., Disp: 30 capsule, Rfl: 2  [facility] 03/29/2018 03/11/2018 02/20/2018  Decreased Interest 0 0 0  Down, Depressed, Hopeless 0 0 0  PHQ - 2 Score 0 0 0    GAD 7 : Generalized Anxiety Score  08/03/2017  Nervous, Anxious, on Edge 3  Control/stop worrying 2  Worry too much - different things 2  Trouble relaxing 0  Restless 0  Easily annoyed or irritable 2  Afraid - awful might happen 2  Total GAD 7 Score 11  Anxiety Difficulty Very difficult    -------------------------------------------------------------------------- O: No physical exam performed due to remote telephone encounter.   No results found for this  or any previous visit (from the past 2160 hour(s)).  -------------------------------------------------------------------------- A&P:  Problem List Items Addressed This Visit    Essential hypertension - Primary Elevated BP off Amlodipine, suspect that it was not causing GI side effect Restart med can start half pill for 70m daily for now, inc to 1 whole pill if needed Monitor BP, f/u within 4-6 weeks or sooner    Other Visit Diagnoses    Abdominal bloating with cramps       Relevant Orders   Ambulatory referral to Gastroenterology   Irritable bowel syndrome with both constipation and diarrhea       Relevant Medications   omeprazole (PRILOSEC) 20 MG capsule   Other Relevant Orders   Ambulatory referral to Gastroenterology   Gastroesophageal reflux disease, esophagitis presence not specified       Relevant Medications   omeprazole (PRILOSEC) 20 MG capsule     Clinically with chronic GI symptoms, constellation with mixed constipation / diarrhea, bloating - Previous work-up 2013 [facility] had Colonoscopy unremarkable - Improve on dicyclomine, but does not seem as effective anymore - Has tried conservative therapy and diet changes - Seems worse with stress  Plan Referral to local GI for consultation and further evaluation and management Trial back on PPI daily Omeprazole 274m  Meds ordered this encounter  Medications  . omeprazole (PRILOSEC) 20 MG capsule    Sig: Take 1 capsule (20 mg total) by mouth daily before breakfast.    Dispense:  30 capsule    Refill:  2    Follow-up: - Return in 6 weeks for BP HTN and GI follow-up  Patient verbalizes understanding with the above medical recommendations including the limitation of remote medical advice.  Specific follow-up and call-back criteria were given for patient to follow-up or seek medical care more urgently if needed.   - Time spent in direct consultation with patient on phone: 13 minutes  AlNobie Putnam DOGrantroup 12/04/2018, 2:31 PM

## 2018-12-04 NOTE — Patient Instructions (Addendum)
Restart HALF pill of Amlodipine - now take 6m daily (half of 159m check BP if doing well can continue, let me know, otherwise take whole pill  If need to add other pill in future we can discuss   Referral to GI specialist - stay tuned for apt  [facility] ([facility]SuTyrone[place] [address]222ain: [phone] Please share the following info and address with your new GI doctor - so that they can get records [place]  [id number] [phone] Continue current medications for stomach, with Dicyclomine, OTC meds as needed.  For Diarrhea - in future can try Imodium PRN only this can slow down bowel movements, only to be used if excessive problem, should not use regularly.  For Constipation (less frequent bowel movement that can be hard dry or involve straining).  Recommend trying OTC Miralax 17g = 1 capful in large glass water once daily for now, try several days to see if working, goal is soft stool or BM 1-2 times daily, if too loose then reduce dose or try every other day. If not effective may need to increase it to 2 doses at once in AM or may do 1 in morning and 1 in afternoon/evening  - This medicine is very safe and can be used often without any problem and will not make you dehydrated. It is good for use on AS NEEDED BASIS or even MAINTENANCE therapy for longer term for several days to weeks at a time to help regulate bowel movements  Other more natural remedies or preventative treatment: - Increase hydration with water - Increase fiber in diet (high fiber foods = vegetables, leafy greens, oats/grains) - May take OTC Fiber supplement (metamucil powder or pill/gummy) - May try OTC Probiotic    Please schedule a Follow-up Appointment to: Return in about 6 weeks (around 01/15/2019), or if symptoms worsen or fail to  improve, for HTN / GI follow-up.  If you have any other questions or concerns, please feel free to call the office or send a message through MyLakevilleYou may also schedule an earlier appointment if necessary.  Additionally, you may be receiving a survey about your experience at our office within a few days to 1 week by e-mail or mail. We value your feedback.  AlNobie PutnamDO [place]

## 2018-12-15 IMAGING — MG MM BREAST LOCALIZATION CLIP
2 series · 2 of 2 positions shown · non-contrast
Comparison: Previous exam(s).

CLINICAL DATA: Post ultrasound-guided core needle biopsy of right
breast 2 o'clock mass.

EXAM:
DIAGNOSTIC RIGHT MAMMOGRAM POST ULTRASOUND BIOPSY

[R CC]
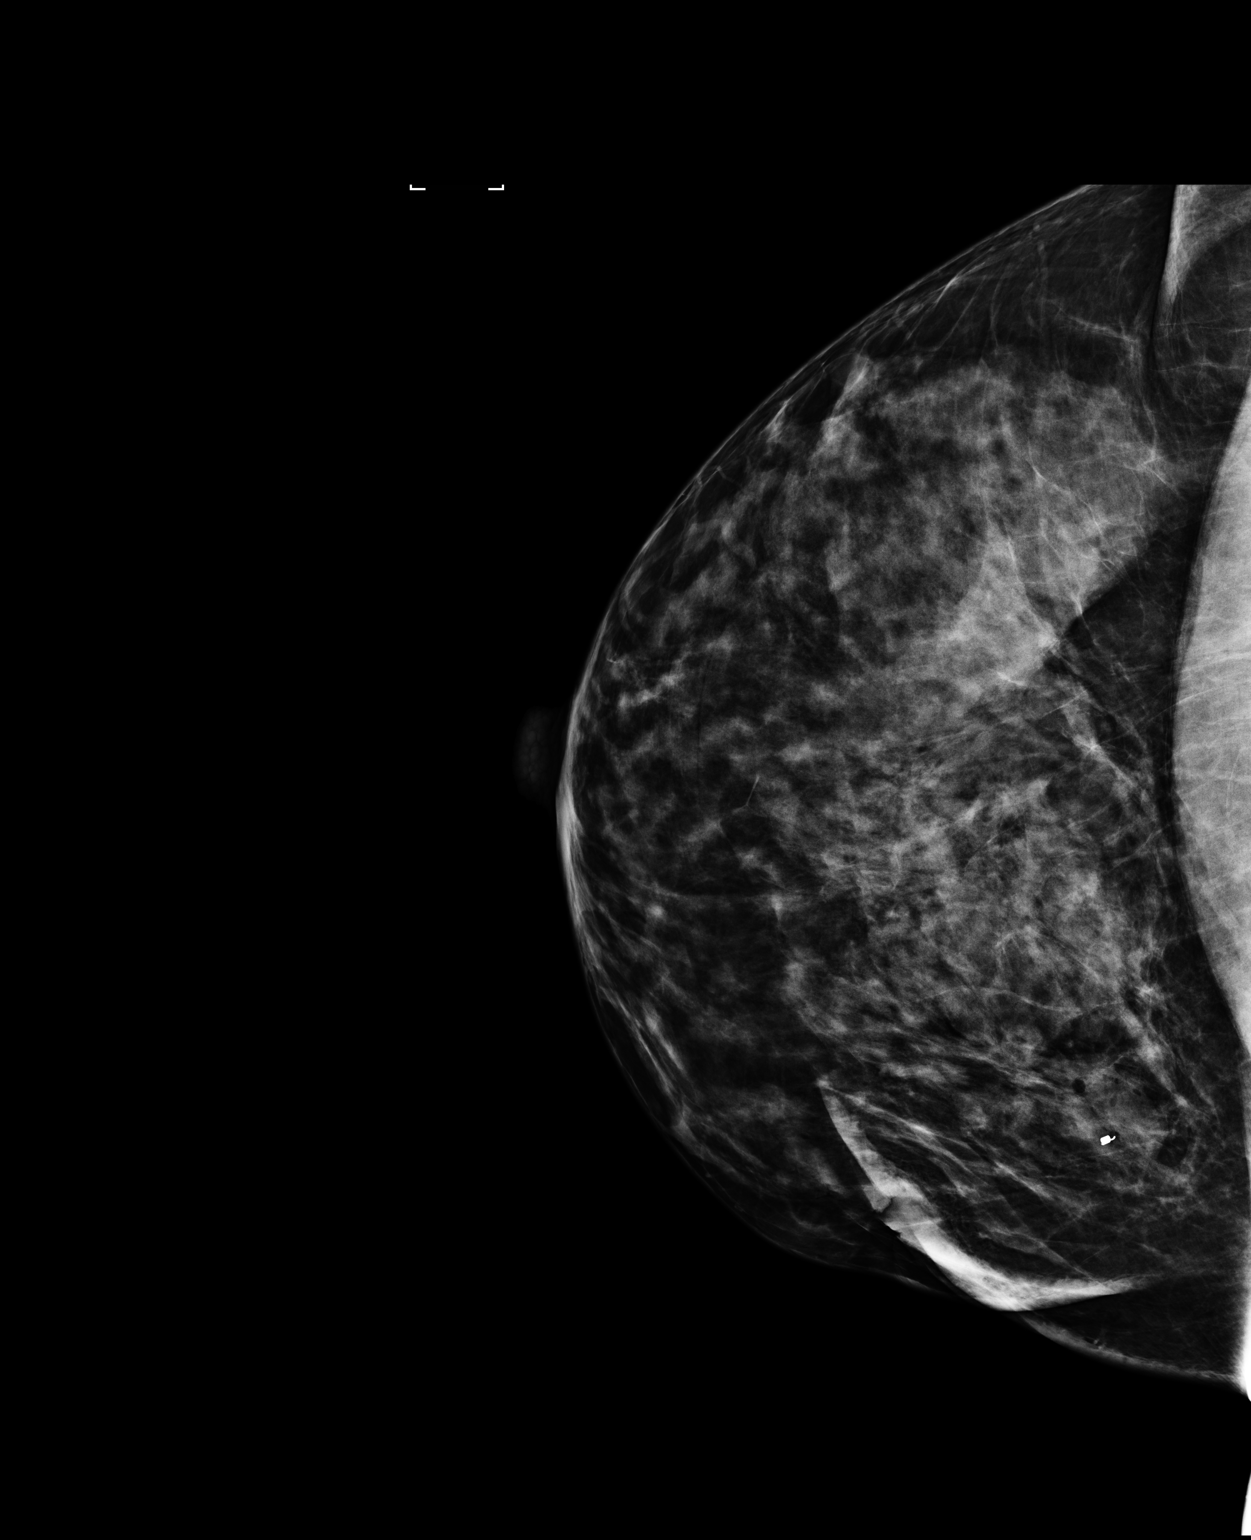

[R ML]
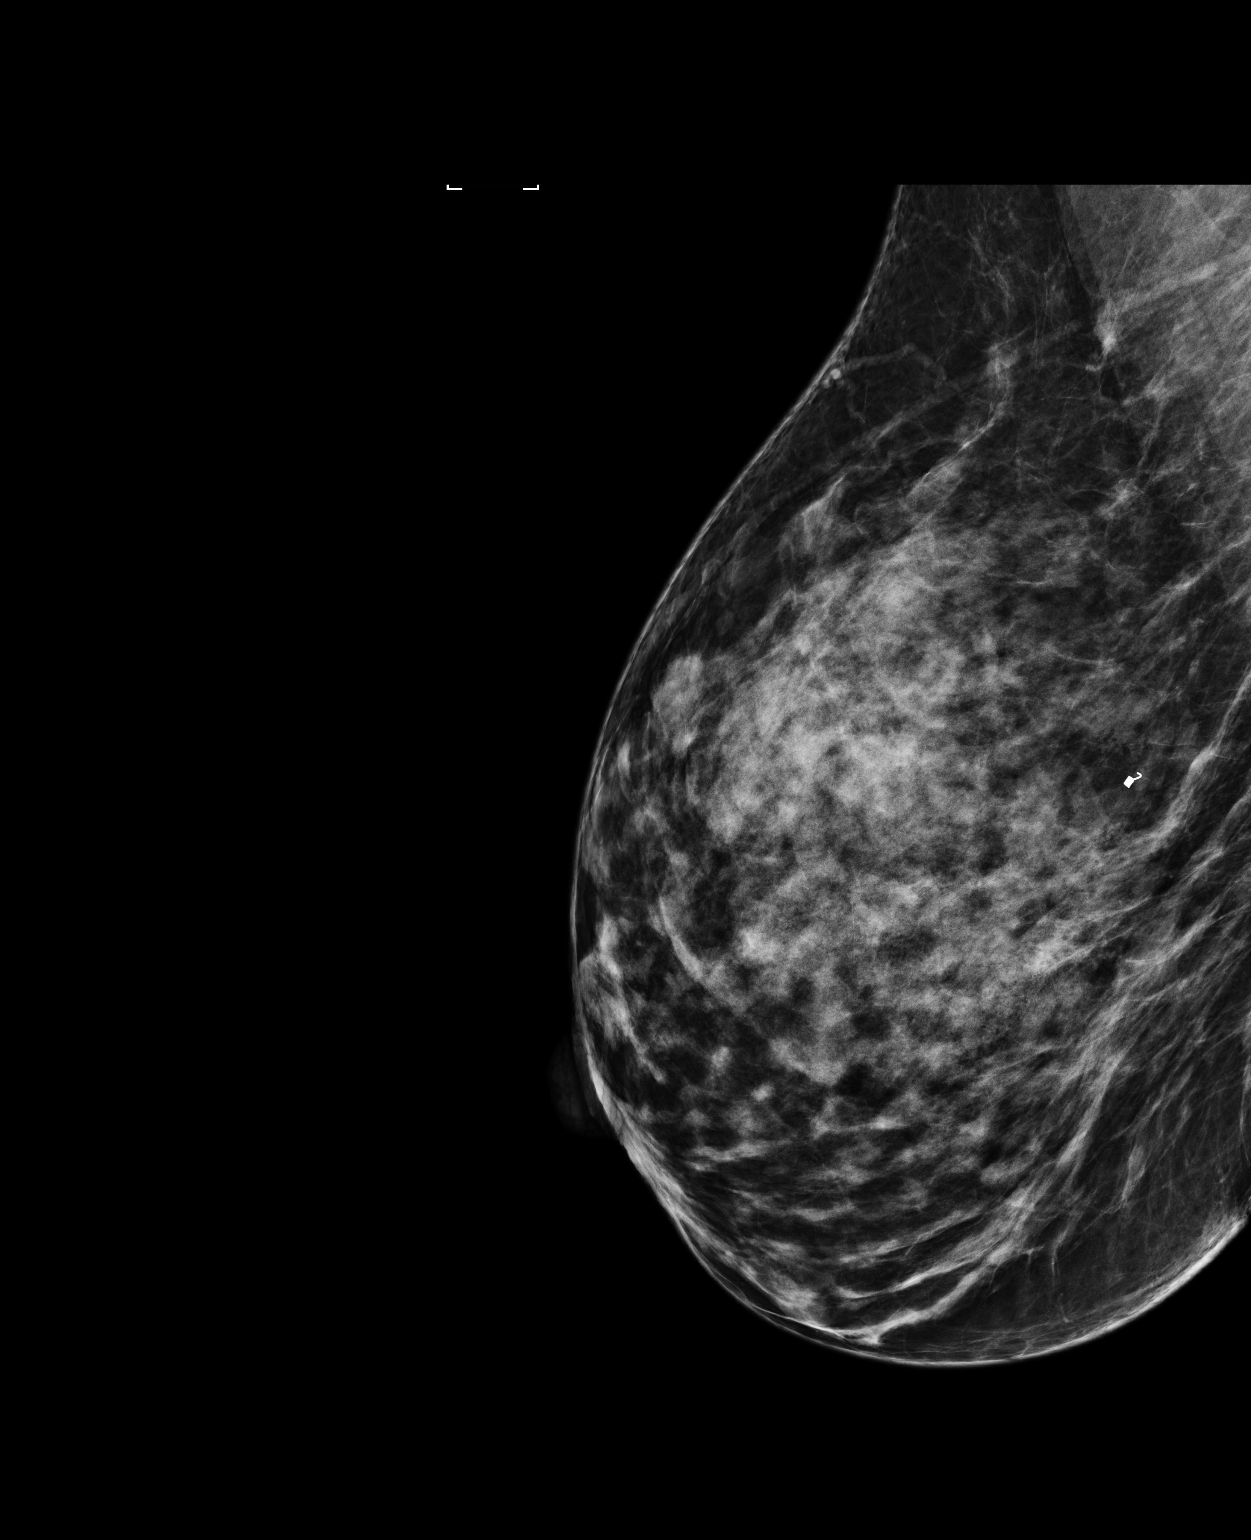

[2 of 2 positions shown; findings below may reference images not displayed]

FINDINGS: Mammographic images were obtained following ultrasound guided biopsy
of right breast 2 o'clock mass. Two-view mammography demonstrates
presence of coil shaped marker in appropriate mammographic position.
IMPRESSION: Successful placement of coil shaped marker, post ultrasound-guided
core needle biopsy of right breast 2 o'clock mass.

Final Assessment: Post Procedure Mammograms for Marker Placement

## 2018-12-31 ENCOUNTER — Ambulatory Visit: Payer: 59 | Admitting: Gastroenterology

## 2019-01-21 ENCOUNTER — Other Ambulatory Visit: Payer: Self-pay

## 2019-01-21 ENCOUNTER — Ambulatory Visit (INDEPENDENT_AMBULATORY_CARE_PROVIDER_SITE_OTHER): Payer: 59 | Admitting: Gastroenterology

## 2019-01-21 ENCOUNTER — Encounter: Payer: Self-pay | Admitting: Gastroenterology

## 2019-01-21 VITALS — BP 132/86 | HR 97 | Temp 98.8°F | Ht 65.0 in | Wt 235.8 lb

## 2019-01-21 DIAGNOSIS — R140 Abdominal distension (gaseous): Secondary | ICD-10-CM

## 2019-01-21 NOTE — Progress Notes (Signed)
Linda Vega [address]  [place]  [place], [place] [address]  Main: [phone]  Fax: [phone]   Gastroenterology Consultation  Referring Provider:     [name] * Primary Care Physician:  Olin Hauser, DO Reason for Consultation:    Abdominal bloating, IBS        HPI:    Chief Complaint  Patient presents with  . New Patient (Initial Visit)    abdominal bloating/IBS    Linda Vega is a 43 y.o. y/o female referred for consultation & management  by Dr. Parks Ranger, Devonne Doughty, DO.  Patient reports chronic history of abdominal bloating.  States this usually occurs after eating dairy products and not without it.  However, continues to use dairy products with her daily coffee.  No weight loss, altered bowel habits, blood in stool, dysphagia or heartburn.  States she was started on omeprazole once daily due to intermittent nausea which has relieved her nausea symptoms.  No family history of colon cancer.  She states as long as she drinks enough water, she has 1-2 formed bowel movements daily.  Does not use anything for bowels.  Reports occasional constipation.  Also describes rectal pressure intermittently.  She was seen by gastroenterologist in 2013 at [facility] for constipation.    She had a colonoscopy at that time.  Procedure report not available.  Patient states it was normal.  Past Medical History:  Diagnosis Date  . Hypertension     Past Surgical History:  Procedure Laterality Date  . BREAST BIOPSY Right 11/23/2017   US guided breast biopsy  . BREAST CYST ASPIRATION Left    neg  . CESAREAN SECTION    . fibrocystic breast      Prior to Admission medications   Medication Sig Start Date End Date Taking? Authorizing Provider  dicyclomine (BENTYL) 10 MG capsule TAKE 1 CAPSULE BY MOUTH 4 TIMES DAILY - BEFORE MEALS AND AT BEDTIME AS NEEDED FOR CRAMPING 09/04/18  Yes Karamalegos, Devonne Doughty, DO  ibuprofen (ADVIL,MOTRIN) 800 MG  tablet Take 1 tablet (800 mg total) by mouth every 8 (eight) hours as needed for headache, moderate pain or cramping. 01/17/18  Yes Karamalegos, Devonne Doughty, DO  Levonorgestrel (LILETTA) 19.5 MCG/DAY IUD IUD by Intrauterine route.   Yes [provider]  omeprazole (PRILOSEC) 20 MG capsule Take 1 capsule (20 mg total) by mouth daily before breakfast. 12/04/18  Yes Karamalegos, Devonne Doughty, DO  valACYclovir (VALTREX) 500 MG tablet Take 1 tablet (500 mg total) by mouth 2 (two) times daily. For 3 to 5 days, as needed for flare 08/20/18  Yes Karamalegos, Devonne Doughty, DO  eletriptan (RELPAX) 20 MG tablet Take 1 tab when headache starts and can repeat 1 tab in 2 hours if headache persists 04/16/17   [provider]  ketoconazole (NIZORAL) 2 % cream  12/14/17   [provider]    Family History  Problem Relation Age of Onset  . Hypertension Mother   . Breast cancer Maternal Aunt 25  . Breast cancer Maternal Grandmother 30     Social History   Tobacco Use  . Smoking status: Never Smoker  . Smokeless tobacco: Never Used  Substance Use Topics  . Alcohol use: Yes    Frequency: Never    Comment: occasional, about 3x a month  . Drug use: No    Allergies as of 01/21/2019 - Review Complete 01/21/2019  Allergen Reaction Noted  . Shellfish allergy Hives, Hypertension, Itching, Palpitations, Shortness  Of Breath, Swelling, and Anaphylaxis 07/13/2011  . Chicken allergy Other (See Comments) 02/11/2014  . Eggs or egg-derived products Hives 08/11/2015    Review of Systems:    All systems reviewed and negative except where noted in HPI.   Physical Exam:  BP 132/86   Pulse 97   Temp 98.8 F (37.1 C)   Ht 5' 5"  (1.651 m)   Wt 235 lb 12.8 oz (107 kg)   BMI 39.24 kg/m  No LMP recorded. Psych:  Alert and cooperative. Normal mood and affect. General:   Alert,  Well-developed, well-nourished, pleasant and cooperative in NAD Head:  Normocephalic and atraumatic. Eyes:  Sclera  clear, no icterus.   Conjunctiva pink. Ears:  Normal auditory acuity. Nose:  No deformity, discharge, or lesions. Mouth:  No deformity or lesions,oropharynx pink & moist. Neck:  Supple; no masses or thyromegaly. Abdomen:  Normal bowel sounds.  No bruits.  Soft, non-tender and non-distended without masses, hepatosplenomegaly or hernias noted.  No guarding or rebound tenderness.    Msk:  Symmetrical without gross deformities. Good, equal movement & strength bilaterally. Pulses:  Normal pulses noted. Extremities:  No clubbing or edema.  No cyanosis. Neurologic:  Alert and oriented x3;  grossly normal neurologically. Skin:  Intact without significant lesions or rashes. No jaundice. Lymph Nodes:  No significant cervical adenopathy. Psych:  Alert and cooperative. Normal mood and affect.   Labs: CBC    Component Value Date/Time   WBC 4.6 09/10/2017 0803   RBC 5.24 (H) 09/10/2017 0803   HGB 12.2 09/10/2017 0803   HCT 37.2 09/10/2017 0803   PLT 233 09/10/2017 0803   MCV 70.9 (L) 09/10/2017 0803   MCH 23.3 (L) 09/10/2017 0803   MCHC 32.8 09/10/2017 0803   RDW 16.0 (H) 09/10/2017 0803   LYMPHSABS 1.7 09/10/2017 0803   MONOABS 0.5 09/10/2017 0803   EOSABS 0.2 09/10/2017 0803   BASOSABS 0.1 09/10/2017 0803   CMP     Component Value Date/Time   NA 133 (L) 09/10/2017 0803   K 4.0 09/10/2017 0803   CL 103 09/10/2017 0803   CO2 25 09/10/2017 0803   GLUCOSE 105 (H) 09/10/2017 0803   BUN 10 09/10/2017 0803   CREATININE 0.89 09/10/2017 0803   CALCIUM 8.8 (L) 09/10/2017 0803   PROT 7.3 09/10/2017 0803   ALBUMIN 4.0 09/10/2017 0803   AST 24 09/10/2017 0803   ALT 17 09/10/2017 0803   ALKPHOS 59 09/10/2017 0803   BILITOT 0.6 09/10/2017 0803   GFRNONAA >60 09/10/2017 0803   GFRAA >60 09/10/2017 0803    Imaging Studies: No results found.  Assessment and Plan:   Linda Vega is a 43 y.o. y/o female has been referred for abdominal bloating and IBS  Patient abdominal bloating is  very correlative after eating dairy She was advised to avoid all dairy products and she verbalized understanding  Given her intermittent bloating, and intermittent nausea we discussed an upper endoscopy to obtain biopsies for H. pylori rule out esophagitis.  Patient is not interested in upper endoscopy but is agreeable to H. pylori serology.  Okay to continue omeprazole ordered by PCP for short time, 1 to 2 months.  Would recommend stopping this after at this time to see if symptoms return  (Risks of PPI use were discussed with patient including bone loss, C. Diff diarrhea, pneumonia, infections, CKD, electrolyte abnormalities.  If clinically possible based on symptoms, goal would be to maintain patient on the lowest dose possible, or discontinue  the medication with institution of acid reflux lifestyle modifications over time. Pt. Verbalizes understanding and chooses to continue the medication.)  Have also advised her about her low FODMAP diet and she verbalized understanding and handout given for the same  Patient is due for screening colonoscopy given her age.  This would also allow [place] to rule out any underlying lesions.  Her intermittent rectal pressure may be due to underlying internal hemorrhoids and this can be evaluated at the time of the colonoscopy.  She does not have any alarm symptoms to indicate urgent procedure  Her bowel movements are at her baseline as she reports 1-2 formed bowel movements a day on most days.  Dr Linda Vega  Speech recognition software was used to dictate the above note.

## 2019-01-21 NOTE — Patient Instructions (Addendum)
Lactose-Free Diet, Adult If you have lactose intolerance, you are not able to digest lactose. Lactose is a natural sugar found mainly in dairy milk and dairy products. You may need to avoid all foods and beverages that contain lactose. A lactose-free diet can help you do this. Which foods have lactose? Lactose is found in dairy milk and dairy products, such as:  Yogurt.  Cheese.  Butter.  Margarine.  Sour cream.  Cream.  Whipped toppings and nondairy creamers.  Ice cream and other dairy-based desserts. Lactose is also found in foods or products made with dairy milk or milk ingredients. To find out whether a food contains dairy milk or a milk ingredient, look at the ingredients list. Avoid foods with the statement "May contain milk" and foods that contain:  Milk powder.  Whey.  Curd.  Caseinate.  Lactose.  Lactalbumin.  Lactoglobulin. What are alternatives to dairy milk and foods made with milk products?  Lactose-free milk.  Soy milk with added calcium and vitamin D.  Almond milk, coconut milk, rice milk, or other nondairy milk alternatives with added calcium and vitamin D. Note that these are low in protein.  Soy products, such as soy yogurt, soy cheese, soy ice cream, and soy-based sour cream.  Other nut milk products, such as almond yogurt, almond cheese, cashew yogurt, cashew cheese, cashew ice cream, coconut yogurt, and coconut ice cream. What are tips for following this plan?  Do not consume foods, beverages, vitamins, minerals, or medicines containing lactose. Read ingredient lists carefully.  Look for the words "lactose-free" on labels.  Use lactase enzyme drops or tablets as directed by your health care provider.  Use lactose-free milk or a milk alternative, such as soy milk or almond milk, for drinking and cooking.  Make sure you get enough calcium and vitamin D in your diet. A lactose-free eating plan can be lacking in these important nutrients.   Take calcium and vitamin D supplements as directed by your health care provider. Talk to your health care provider about supplements if you are not able to get enough calcium and vitamin D from food. What foods can I eat?  Fruits All fresh, canned, frozen, or dried fruits that are not processed with lactose. Vegetables All fresh, frozen, and canned vegetables without cheese, cream, or butter sauces. Grains Any that are not made with dairy milk or dairy products. Meats and other proteins Any meat, fish, poultry, and other protein sources that are not made with dairy milk or dairy products. Soy cheese and yogurt. Fats and oils Any that are not made with dairy milk or dairy products. Beverages Lactose-free milk. Soy, rice, or almond milk with added calcium and vitamin D. Fruit and vegetable juices. Sweets and desserts Any that are not made with dairy milk or dairy products. Seasonings and condiments Any that are not made with dairy milk or dairy products. Calcium Calcium is found in many foods that contain lactose and is important for bone health. The amount of calcium you need depends on your age:  Adults younger than 50 years: 1,000 mg of calcium a day.  Adults older than 50 years: 1,200 mg of calcium a day. If you are not getting enough calcium, you may get it from other sources, including:  Orange juice with calcium added. There are 300-350 mg of calcium in 1 cup of orange juice.  Calcium-fortified soy milk. There are 300-400 mg of calcium in 1 cup of calcium-fortified soy milk.  Calcium-fortified rice or almond milk.  There are 300 mg of calcium in 1 cup of calcium-fortified rice or almond milk.  Calcium-fortified breakfast cereals. There are 100-1,000 mg of calcium in calcium-fortified breakfast cereals.  Spinach, cooked. There are 145 mg of calcium in  cup of cooked spinach.  Edamame, cooked. There are 130 mg of calcium in  cup of cooked edamame.  Collard greens, cooked.  There are 125 mg of calcium in  cup of cooked collard greens.  Kale, frozen or cooked. There are 90 mg of calcium in  cup of cooked or frozen kale.  Almonds. There are 95 mg of calcium in  cup of almonds.  Broccoli, cooked. There are 60 mg of calcium in 1 cup of cooked broccoli. The items listed above may not be a complete list of recommended foods and beverages. Contact a dietitian for more options. What foods are not recommended? Fruits None, unless they are made with dairy milk or dairy products. Vegetables None, unless they are made with dairy milk or dairy products. Grains Any grains that are made with dairy milk or dairy products. Meats and other proteins None, unless they are made with dairy milk or dairy products. Dairy All dairy products, including milk, goat's milk, buttermilk, kefir, acidophilus milk, flavored milk, evaporated milk, condensed milk, dulce de [place], eggnog, yogurt, cheese, and cheese spreads. Fats and oils Any that are made with milk or milk products. Margarines and salad dressings that contain milk or cheese. Cream. Half and half. Cream cheese. Sour cream. Chip dips made with sour cream or yogurt. Beverages Hot chocolate. Cocoa with lactose. Instant iced teas. Powdered fruit drinks. Smoothies made with dairy milk or yogurt. Sweets and desserts Any that are made with milk or milk products. Seasonings and condiments Chewing gum that has lactose. Spice blends if they contain lactose. Artificial sweeteners that contain lactose. Nondairy creamers. The items listed above may not be a complete list of foods and beverages to avoid. Contact a dietitian for more information. Summary  If you are lactose intolerant, it means that you have a hard time digesting lactose, a natural sugar found in milk and milk products.  Following a lactose-free diet can help you manage this condition.  Calcium is important for bone health and is found in many foods that contain  lactose. Talk with your health care provider about other sources of calcium. This information is not intended to replace advice given to you by your health care provider. Make sure you discuss any questions you have with your health care provider. Document Released: 01/06/2002 Document Revised: 08/14/2017 Document Reviewed: 08/14/2017 Elsevier Interactive Patient Education  2019 [employer]  FODMAPs (fermentable oligosaccharides, disaccharides, monosaccharides, and polyols) are sugars that are hard for some people to digest. A low-FODMAP eating plan may help some people who have bowel (intestinal) diseases to manage their symptoms. This meal plan can be complicated to follow. Work with a diet and nutrition specialist (dietitian) to make a low-FODMAP eating plan that is right for you. A dietitian can make sure that you get enough nutrition from this diet. What are tips for following this plan? Reading food labels  Check labels for hidden FODMAPs such as: ? High-fructose syrup. ? Honey. ? Agave. ? Natural fruit flavors. ? Onion or garlic powder.  Choose low-FODMAP foods that contain 3-4 grams of fiber per serving.  Check food labels for serving sizes. Eat only one serving at a time to make sure FODMAP levels stay low. Meal planning  Follow a  low-FODMAP eating plan for up to 6 weeks, or as told by your health care provider or dietitian.  To follow the eating plan: 1. Eliminate high-FODMAP foods from your diet completely. 2. Gradually reintroduce high-FODMAP foods into your diet one at a time. Most people should wait a few days after introducing one high-FODMAP food before they introduce the next high-FODMAP food. Your dietitian can recommend how quickly you may reintroduce foods. 3. Keep a daily record of what you eat and drink, and make note of any symptoms that you have after eating. 4. Review your daily record with a dietitian regularly. Your dietitian can help  you identify which foods you can eat and which foods you should avoid. General tips  Drink enough fluid each day to keep your urine pale yellow.  Avoid processed foods. These often have added sugar and may be high in FODMAPs.  Avoid most dairy products, whole grains, and sweeteners.  Work with a dietitian to make sure you get enough fiber in your diet. Recommended foods Grains  Gluten-free grains, such as rice, oats, buckwheat, quinoa, corn, polenta, and millet. Gluten-free pasta, bread, or cereal. Rice noodles. Corn tortillas. Vegetables  Eggplant, zucchini, cucumber, peppers, green beans, Brussels sprouts, bean sprouts, lettuce, arugula, kale, Swiss chard, spinach, collard greens, bok choy, summer squash, potato, and tomato. Limited amounts of corn, carrot, and sweet potato. Green parts of scallions. Fruits  Bananas, oranges, lemons, limes, blueberries, raspberries, strawberries, grapes, cantaloupe, honeydew melon, kiwi, papaya, passion fruit, and pineapple. Limited amounts of dried cranberries, banana chips, and shredded coconut. Dairy  Lactose-free milk, yogurt, and kefir. Lactose-free cottage cheese and ice cream. Non-dairy milks, such as almond, coconut, hemp, and rice milk. Yogurts made of non-dairy milks. Limited amounts of goat cheese, brie, mozzarella, parmesan, swiss, and other hard cheeses. Meats and other protein foods  Unseasoned beef, pork, poultry, or fish. Eggs. Berniece Salines. Tofu (firm) and tempeh. Limited amounts of nuts and seeds, such as almonds, walnuts, [place] nuts, pecans, peanuts, pumpkin seeds, chia seeds, and sunflower seeds. Fats and oils  Butter-free spreads. Vegetable oils, such as olive, canola, and sunflower oil. Seasoning and other foods  Artificial sweeteners with names that do not end in "ol" such as aspartame, saccharine, and stevia. Maple syrup, white table sugar, raw sugar, brown sugar, and molasses. Fresh basil, coriander, parsley, rosemary, and thyme.  Beverages  Water and mineral water. Sugar-sweetened soft drinks. Small amounts of orange juice or cranberry juice. Black and green tea. Most dry wines. Coffee. This may not be a complete list of low-FODMAP foods. Talk with your dietitian for more information. Foods to avoid Grains  Wheat, including kamut, durum, and semolina. Barley and bulgur. Couscous. Wheat-based cereals. Wheat noodles, bread, crackers, and pastries. Vegetables  Chicory root, artichoke, asparagus, cabbage, snow peas, sugar snap peas, mushrooms, and cauliflower. Onions, garlic, leeks, and the white part of scallions. Fruits  Fresh, dried, and juiced forms of apple, pear, watermelon, peach, plum, cherries, apricots, blackberries, boysenberries, figs, nectarines, and mango. Avocado. Dairy  Milk, yogurt, ice cream, and soft cheese. Cream and sour cream. Milk-based sauces. Custard. Meats and other protein foods  Fried or fatty meat. Sausage. Cashews and pistachios. Soybeans, baked beans, black beans, chickpeas, kidney beans, fava beans, navy beans, lentils, and split peas. Seasoning and other foods  Any sugar-free gum or candy. Foods that contain artificial sweeteners such as sorbitol, mannitol, isomalt, or xylitol. Foods that contain honey, high-fructose corn syrup, or agave. Bouillon, vegetable stock, beef stock, and chicken stock. Garlic  and onion powder. Condiments made with onion, such as hummus, chutney, pickles, relish, salad dressing, and salsa. Tomato paste. Beverages  Chicory-based drinks. Coffee substitutes. Chamomile tea. Fennel tea. Sweet or fortified wines such as port or sherry. Diet soft drinks made with isomalt, mannitol, maltitol, sorbitol, or xylitol. Apple, pear, and mango juice. Juices with high-fructose corn syrup. This may not be a complete list of high-FODMAP foods. Talk with your dietitian to discuss what dietary choices are best for you.  Summary  A low-FODMAP eating plan is a short-term diet  that eliminates FODMAPs from your diet to help ease symptoms of certain bowel diseases.  The eating plan usually lasts up to 6 weeks. After that, high-FODMAP foods are restarted gradually, one at a time, so you can find out which may be causing symptoms.  A low-FODMAP eating plan can be complicated. It is best to work with a dietitian who has experience with this type of plan. This information is not intended to replace advice given to you by your health care provider. Make sure you discuss any questions you have with your health care provider. Document Released: 03/13/2017 Document Revised: 03/13/2017 Document Reviewed: 03/13/2017 Elsevier Interactive Patient Education  [employer].

## 2019-01-28 ENCOUNTER — Other Ambulatory Visit: Payer: Self-pay

## 2019-01-28 ENCOUNTER — Other Ambulatory Visit
Admission: RE | Admit: 2019-01-28 | Discharge: 2019-01-28 | Disposition: A | Discharge status: Home / Self Care | Payer: 59 | Attending: Gastroenterology | Admitting: Gastroenterology

## 2019-01-28 DIAGNOSIS — R140 Abdominal distension (gaseous): Secondary | ICD-10-CM | POA: Diagnosis not present

## 2019-01-29 LAB — H PYLORI, IGM, IGG, IGA AB
H Pylori IgG: 0.2 Index Value (ref 0.00–0.79)
H. Pylogi, Iga Abs: <9 units (ref 0.0–8.9)
H. Pylogi, Igm Abs: <9 units (ref 0.0–8.9)

## 2019-02-03 ENCOUNTER — Other Ambulatory Visit: Payer: Self-pay | Admitting: Family Medicine

## 2019-02-03 DIAGNOSIS — R140 Abdominal distension (gaseous): Secondary | ICD-10-CM

## 2019-02-03 DIAGNOSIS — R109 Unspecified abdominal pain: Secondary | ICD-10-CM

## 2019-02-04 NOTE — Telephone Encounter (Signed)
Please contact patient to ask more information. This is not enough information to send a referral. I don't see a diagnosis from past few office visit on feet or hands. She will need to give more specific info or if more complicated, then she needs to schedule office visit likely virtual but if she prefers can do in person.  Linda Vega, [facility] 02/04/2019, 6:41 PM

## 2019-02-05 NOTE — Telephone Encounter (Signed)
Spoke to the patient she already received call from [facility] she doesn't need anything from PCP for right now she is good on referral and they scheduled her appointment.

## 2019-02-12 ENCOUNTER — Encounter: Payer: 59 | Admitting: Obstetrics and Gynecology

## 2019-02-17 ENCOUNTER — Other Ambulatory Visit: Payer: Self-pay | Admitting: Family Medicine

## 2019-02-17 DIAGNOSIS — N946 Dysmenorrhea, unspecified: Secondary | ICD-10-CM

## 2019-02-26 ENCOUNTER — Ambulatory Visit: Payer: 59 | Admitting: Orthopaedic Surgery

## 2019-03-05 ENCOUNTER — Encounter: Payer: Self-pay | Admitting: Obstetrics and Gynecology

## 2019-03-05 ENCOUNTER — Encounter: Payer: 59 | Admitting: Obstetrics and Gynecology

## 2019-03-05 ENCOUNTER — Other Ambulatory Visit: Payer: Self-pay

## 2019-03-05 ENCOUNTER — Ambulatory Visit (INDEPENDENT_AMBULATORY_CARE_PROVIDER_SITE_OTHER): Payer: 59 | Admitting: Obstetrics and Gynecology

## 2019-03-05 VITALS — BP 130/86 | Ht 67.0 in | Wt 234.0 lb

## 2019-03-05 DIAGNOSIS — Z30431 Encounter for routine checking of intrauterine contraceptive device: Secondary | ICD-10-CM | POA: Diagnosis not present

## 2019-03-05 DIAGNOSIS — N939 Abnormal uterine and vaginal bleeding, unspecified: Secondary | ICD-10-CM | POA: Diagnosis not present

## 2019-03-05 DIAGNOSIS — D259 Leiomyoma of uterus, unspecified: Secondary | ICD-10-CM | POA: Diagnosis not present

## 2019-03-05 NOTE — Progress Notes (Signed)
Patient ID: Linda Vega, female   DOB: 29-Aug-1975, 43 y.o.   MRN: [id number]  Reason for Consult: Menstrual Problem (IUD liletta placed 02/19/18, irregular bleeding sense IUD place, no pain )   Referred by [name] *  Subjective:     HPI:  Linda Vega is a 43 y.o. female . She is here today. She had been having daily persistent bleeding/spotting with her IUD over the last year. She would like to make sure it is in the right location. It was a difficult IUD placement before. She has symptoms of a period but never has bleeding like a period. This is an improvement in her symptoms . Before the IUD she has a monthly period with heavy bleeding.   Gynecological History Describes periods as monthly and heavy in the past.  Last pap smear: 2018 NIL Last Mammogram: 2019- [facility] 4 History of STDs: remote hx of chlamydia Sexually Active: yes, some discomfort depending on the position.   Obstetrical History Hx of a full term delivery Hx of an ectopic pregnancy with laparoscopic removal of one fallopian tube, patient unsure of laterality.   Past Medical History:  Diagnosis Date  . Hypertension    Family History  Problem Relation Age of Onset  . Hypertension Mother   . Breast cancer Maternal Aunt 14  . Breast cancer Maternal Grandmother 30   Past Surgical History:  Procedure Laterality Date  . BREAST BIOPSY Right 11/23/2017   US guided breast biopsy  . BREAST CYST ASPIRATION Left    neg  . CESAREAN SECTION    . fibrocystic breast      Short Social History:  Social History   Tobacco Use  . Smoking status: Never Smoker  . Smokeless tobacco: Never Used  Substance Use Topics  . Alcohol use: Yes    Frequency: Never    Comment: occasional, about 3x a month    Allergies  Allergen Reactions  . Shellfish Allergy Hives, Hypertension, Itching, Palpitations, Shortness Of Breath, Swelling and Anaphylaxis  . Chicken Allergy Other (See Comments)    Listed per pt  request.  . Eggs Or Egg-Derived Products Hives    Current Outpatient Medications  Medication Sig Dispense Refill  . amLODipine (NORVASC) 10 MG tablet     . dicyclomine (BENTYL) 10 MG capsule TAKE 1 CAPSULE BY MOUTH 4 TIMES DAILY - BEFORE MEALS AND AT BEDTIME AS NEEDED FOR CRAMPING 60 capsule 3  . ibuprofen (ADVIL) 800 MG tablet TAKE 1 TABLET BY MOUTH EVERY 8 HOURS AS NEEDED FOR HEADACHE, MODERATE PAIN OR CRAMPING. 30 tablet 3  . ketoconazole (NIZORAL) 2 % cream   1  . Levonorgestrel (LILETTA) 19.5 MCG/DAY IUD IUD by Intrauterine route.    . valACYclovir (VALTREX) 500 MG tablet Take 1 tablet (500 mg total) by mouth 2 (two) times daily. For 3 to 5 days, as needed for flare 30 tablet 5  . omeprazole (PRILOSEC) 20 MG capsule Take 1 capsule (20 mg total) by mouth daily before breakfast. (Patient not taking: Reported on 03/05/2019) 30 capsule 2   No current facility-administered medications for this visit.     Review of Systems  Constitutional: Negative for chills, fatigue, fever and unexpected weight change.  HENT: Negative for trouble swallowing.  Eyes: Negative for loss of vision.  Respiratory: Negative for cough, shortness of breath and wheezing.  Cardiovascular: Negative for chest pain, leg swelling, palpitations and syncope.  GI: Negative for abdominal pain, blood in stool, diarrhea, nausea and vomiting.  GU: Negative for difficulty urinating, dysuria, frequency and hematuria.  Musculoskeletal: Negative for back pain, leg pain and joint pain.  Skin: Negative for rash.  Neurological: Negative for dizziness, headaches, light-headedness, numbness and seizures.  Psychiatric: Negative for behavioral problem, confusion, depressed mood and sleep disturbance.        Objective:  Objective   Vitals:   03/05/19 1107  BP: 130/86  Weight: 234 lb (106.1 kg)  Height: 5' 7"  (1.702 m)   Body mass index is 36.65 kg/m.  Physical Exam Vitals signs and nursing note reviewed.  Constitutional:       Appearance: She is well-developed.  HENT:     Head: Normocephalic and atraumatic.  Eyes:     Pupils: Pupils are equal, round, and reactive to light.  Cardiovascular:     Rate and Rhythm: Normal rate and regular rhythm.  Pulmonary:     Effort: Pulmonary effort is normal. No respiratory distress.  Genitourinary:    Comments: External: Vulva normal. no lesions noted.  Speculum examination:  Cervix normal . Small drak red blood in the vaginal vault. no discharge.  IUD strings seen Bimanual examination: Uterus enlarged, bulky, non-tender.  no CMT. Adnexa full with enlarged uterus. Pelvis mobile.   Skin:    General: Skin is warm and dry.  Neurological:     Mental Status: She is alert and oriented to person, place, and time.  Psychiatric:        Behavior: Behavior normal.        Thought Content: Thought content normal.        Judgment: Judgment normal.        Assessment/Plan:     43 yo with abnormal uterine bleeding  IUD check, strings seen- patient overall happy with improved bleeding pattern. Enlarged fibroid uterus, long discussion regarding management option for a fibroid uterus.  Will obtain US to check IUD location as well as access uterine fibroids and her irregular bleeding. Follow up in 3 weeks.   More than 30 minutes were spent face to face with the patient in the room with more than 50% of the time spent providing counseling and discussing the plan of management.     Adrian Prows MD [facility] 03/05/2019 11:52 AM

## 2019-03-12 DIAGNOSIS — B360 Pityriasis versicolor: Secondary | ICD-10-CM | POA: Diagnosis not present

## 2019-03-24 ENCOUNTER — Ambulatory Visit: Payer: 59 | Admitting: Obstetrics and Gynecology

## 2019-03-24 ENCOUNTER — Other Ambulatory Visit: Payer: 59

## 2019-04-09 ENCOUNTER — Telehealth: Payer: Self-pay

## 2019-04-09 ENCOUNTER — Ambulatory Visit: Payer: 59 | Admitting: Obstetrics and Gynecology

## 2019-04-09 ENCOUNTER — Ambulatory Visit: Payer: 59

## 2019-04-09 NOTE — Telephone Encounter (Signed)
Patient called our office and left a voicemail stating she wanted to scheduled a colonoscopy for 04/22/2019 in the afternoon. Called patient back and informed patient that she has openings that day but she does procedures in the morning on the 04/22/19. Informed patient that she does do in the afternoon on 04/21/2019 in [place]. Patient states that is not a good day for her. Explained to patient she would need a driver for her procedure. Patient states she does not know who would drive her. Patient states she will give [place] a call back when she figures out who can drive her for the colonoscopy.

## 2019-07-14 ENCOUNTER — Telehealth: Payer: Self-pay | Admitting: Family Medicine

## 2019-07-14 ENCOUNTER — Other Ambulatory Visit: Payer: Self-pay | Admitting: Family Medicine

## 2019-07-14 ENCOUNTER — Other Ambulatory Visit: Payer: Self-pay

## 2019-07-14 ENCOUNTER — Ambulatory Visit
Admission: RE | Admit: 2019-07-14 | Discharge: 2019-07-14 | Disposition: A | Discharge status: Home / Self Care | Payer: 59 | Attending: Family Medicine | Admitting: Family Medicine

## 2019-07-14 ENCOUNTER — Ambulatory Visit
Admission: RE | Admit: 2019-07-14 | Discharge: 2019-07-14 | Disposition: A | Discharge status: Home / Self Care | Payer: 59 | Source: Ambulatory Visit | Attending: Family Medicine | Admitting: Family Medicine

## 2019-07-14 DIAGNOSIS — M545 Low back pain: Secondary | ICD-10-CM | POA: Diagnosis not present

## 2019-07-14 DIAGNOSIS — M5441 Lumbago with sciatica, right side: Secondary | ICD-10-CM | POA: Insufficient documentation

## 2019-07-14 DIAGNOSIS — G8929 Other chronic pain: Secondary | ICD-10-CM

## 2019-07-14 DIAGNOSIS — M25551 Pain in right hip: Secondary | ICD-10-CM

## 2019-07-14 NOTE — Addendum Note (Signed)
Addended by: Olin Hauser on: 07/14/2019 01:46 PM   Modules accepted: Orders

## 2019-07-14 NOTE — Telephone Encounter (Signed)
error 

## 2019-07-15 ENCOUNTER — Telehealth: Payer: Self-pay | Admitting: Obstetrics and Gynecology

## 2019-07-15 ENCOUNTER — Other Ambulatory Visit: Payer: Self-pay | Admitting: Obstetrics and Gynecology

## 2019-07-15 DIAGNOSIS — D259 Leiomyoma of uterus, unspecified: Secondary | ICD-10-CM

## 2019-07-15 DIAGNOSIS — N939 Abnormal uterine and vaginal bleeding, unspecified: Secondary | ICD-10-CM

## 2019-07-15 NOTE — Telephone Encounter (Signed)
CRS response.

## 2019-07-15 NOTE — Telephone Encounter (Signed)
Need new u/s order for pt

## 2019-07-15 NOTE — Telephone Encounter (Signed)
There is an order from august in her chart that seems to still be active. Why does she need another?

## 2019-07-15 NOTE — Telephone Encounter (Signed)
Please advise again, Thank you

## 2019-07-15 NOTE — Telephone Encounter (Signed)
When scheduling pt there was an error message that popped up not allowing me to schedule stating a new order is needed since it was out of date that's why this message was sent.

## 2019-07-15 NOTE — Telephone Encounter (Signed)
Order placed

## 2019-07-15 NOTE — Telephone Encounter (Signed)
Please advise. Thank you

## 2019-07-16 ENCOUNTER — Encounter: Payer: Self-pay | Admitting: Family Medicine

## 2019-07-16 ENCOUNTER — Other Ambulatory Visit: Payer: Self-pay

## 2019-07-16 ENCOUNTER — Ambulatory Visit (INDEPENDENT_AMBULATORY_CARE_PROVIDER_SITE_OTHER): Payer: 59 | Admitting: Family Medicine

## 2019-07-16 DIAGNOSIS — G8929 Other chronic pain: Secondary | ICD-10-CM

## 2019-07-16 DIAGNOSIS — M5441 Lumbago with sciatica, right side: Secondary | ICD-10-CM | POA: Diagnosis not present

## 2019-07-16 DIAGNOSIS — M4726 Other spondylosis with radiculopathy, lumbar region: Secondary | ICD-10-CM | POA: Diagnosis not present

## 2019-07-16 MED ORDER — BACLOFEN 10 MG PO TABS: 5.0000 mg | ORAL_TABLET | Freq: Three times a day (TID) | ORAL | 2 refills | Status: DC | PRN

## 2019-07-16 NOTE — Progress Notes (Signed)
Virtual Visit via Telephone The purpose of this virtual visit is to provide medical care while limiting exposure to the novel coronavirus (COVID19) for both patient and office staff.  Consent was obtained for phone visit:  Yes.   Answered questions that patient had about telehealth interaction:  Yes.   I discussed the limitations, risks, security and privacy concerns of performing an evaluation and management service by telephone. I also discussed with the patient that there may be a patient responsible charge related to this service. The patient expressed understanding and agreed to proceed.  Patient Location: Home Provider Location: [facility])  ---------------------------------------------------------------------- Chief Complaint  Patient presents with  . Back Pain    chronic Intermittent Right sided low back pain, mostly at bedtime. Also nauseated feeling w/ discomfort in the abdomen. x 1 yr    S: Reviewed CMA documentation. I have called patient and gathered additional HPI as follows:  FOLLOW-UP Low Back Pain, R sided with sciatica  Chronic back pain Prior history 2019 treated for back pain similar issue, w/ meloxicam NSAID muscle relaxant flexeril PRN Overall some improvement But still has same issue, now following up again today. 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 with more physical work, more standing and prolonged activity Difficulty with even bending She has changed mattress and other things without as much relief - Still has flare up with back pain episodes can happen any time, not having constant back pain, flare may be regardless of activity sitting laying standing, will have some radiating pain down R leg still sciatica symptoms no priorback surgery - Denies any fevers/chills, numbness, tingling, weakness, loss of control  bladder/bowel incontinence or retention, unintentional wt loss, night sweats   Denies any high risk travel to areas of current concern for COVID19. Denies any known or suspected exposure to person with or possibly with COVID19.  Denies any fevers, chills, sweats, body ache, cough, shortness of breath, sinus pain or pressure, headache, abdominal pain, diarrhea  Past Medical History:  Diagnosis Date  . Hypertension    Social History   Tobacco Use  . Smoking status: Never Smoker  . Smokeless tobacco: Never Used  Substance Use Topics  . Alcohol use: Yes    Comment: occasional, about 3x a month  . Drug use: No    Current Outpatient Medications:  .  amLODipine (NORVASC) 10 MG tablet, , Disp: , Rfl:  .  ibuprofen (ADVIL) 800 MG tablet, TAKE 1 TABLET BY MOUTH EVERY 8 HOURS AS NEEDED FOR HEADACHE, MODERATE PAIN OR CRAMPING., Disp: 30 tablet, Rfl: 3 .  ketoconazole (NIZORAL) 2 % cream, , Disp: , Rfl: 1 .  lactobacilus acidophilus & bulgar (FLORANEX) TABS chewable tablet, Take 1 tablet by mouth 3 (three) times daily with meals., Disp: , Rfl:  .  Levonorgestrel (LILETTA) 19.5 MCG/DAY IUD IUD, by Intrauterine route., Disp: , Rfl:  .  Multiple Vitamin (MULTIVITAMIN) tablet, Take 1 tablet by mouth daily., Disp: , Rfl:  .  valACYclovir (VALTREX) 500 MG tablet, Take 1 tablet (500 mg total) by mouth 2 (two) times daily. For 3 to 5 days, as needed for flare, Disp: 30 tablet, Rfl: 5 .  baclofen (LIORESAL) 10 MG tablet, Take 0.5-1 tablets (5-10 mg total) by mouth 3 (three) times daily as needed for muscle spasms., Disp: 30 each, Rfl: 2 .  dicyclomine (BENTYL) 10 MG capsule,  TAKE 1 CAPSULE BY MOUTH 4 TIMES DAILY - BEFORE MEALS AND AT BEDTIME AS NEEDED FOR CRAMPING (Patient not taking: Reported on 07/16/2019), Disp: 60 capsule, Rfl: 3 .  omeprazole (PRILOSEC) 20 MG capsule, Take 1 capsule (20 mg total) by mouth daily before breakfast. (Patient not taking: Reported on 07/16/2019), Disp: 30 capsule, Rfl:  2  Depression screen [facility] 2/9 07/16/2019 03/29/2018 03/11/2018  Decreased Interest 0 0 0  Down, Depressed, Hopeless 0 0 0  PHQ - 2 Score 0 0 0    GAD 7 : Generalized Anxiety Score 08/03/2017  Nervous, Anxious, on Edge 3  Control/stop worrying 2  Worry too much - different things 2  Trouble relaxing 0  Restless 0  Easily annoyed or irritable 2  Afraid - awful might happen 2  Total GAD 7 Score 11  Anxiety Difficulty Very difficult    -------------------------------------------------------------------------- O: No physical exam performed due to remote telephone encounter.  Lab results reviewed.  No results found for this or any previous visit (from the past 2160 hour(s)).  -------------------------------------------------------------------------- A&P:  I have personally reviewed the radiology report from 07/14/19 Lumbar and Hip R X-ray.  CLINICAL DATA:  Chronic low back pain  EXAM: LUMBAR SPINE - COMPLETE 4+ VIEW  COMPARISON:  None.  FINDINGS: There are rudimentary ribs at the T12 level. There are advanced degenerative changes at the L5-S1 level with a at least 10 mm anterolisthesis L5 on S1. There is a questionable pars defect at this level.  IMPRESSION: 1. No acute abnormality. 2. Advanced degenerative changes at the L5-S1 level with at least 10 mm anterolisthesis L5 on S1 with a questionable pars defect at this level. 3. Rudimentary ribs are noted at the T12 level.   Electronically Signed   By: Constance Holster M.D.   On: 07/14/2019 20:31  -------  CLINICAL DATA:  Chronic right hip pain and low back pain.  EXAM: DG HIP (WITH OR WITHOUT PELVIS) 2-3V RIGHT  COMPARISON:  None.  FINDINGS: There is no evidence of hip fracture or dislocation. There is no evidence of arthropathy or other focal bone abnormality. There is an IUD in place  IMPRESSION: Negative.   Electronically Signed   By: Constance Holster M.D.   On: 07/14/2019  20:25   Problem List Items Addressed This Visit    Osteoarthritis of spine with radiculopathy, lumbar region   Relevant Medications   baclofen (LIORESAL) 10 MG tablet   Other Relevant Orders   Ambulatory referral to Orthopedic Surgery   Chronic right-sided low back pain with right-sided sciatica - Primary   Relevant Medications   baclofen (LIORESAL) 10 MG tablet   Other Relevant Orders   Ambulatory referral to Orthopedic Surgery     Subacute on chronic R LBP with associated R sciatica. Confirmed recent OA/DJD advanced on last X-ray lumbar spine In setting of known chronic LBP with DJD, without prior surgery. No red flag symptoms - Inadequate conservative therapy  Vs limited improvement only temporary on rx prior  Plan: 1. Limit oral NSAIDs since only temporary response, can use in future if need 2. Switch to Baclofen muscle relaxant PRN to avoid as much sedation on flexeril 3. May use Tylenol PRN for breakthrough 4. Encouraged use of heating pad 1-2x daily for now then PRN 5. Referral to [facility] for next steps in management - Future consider Gabapentin, other med options in future   Orders Placed This Encounter  Procedures  . Ambulatory referral to Orthopedic Surgery  Referral Priority:   Routine    Referral Type:   Surgical    Referral Reason:   Specialty Services Required    Requested Specialty:   Orthopedic Surgery    Number of Visits Requested:   1     Meds ordered this encounter  Medications  . baclofen (LIORESAL) 10 MG tablet    Sig: Take 0.5-1 tablets (5-10 mg total) by mouth 3 (three) times daily as needed for muscle spasms.    Dispense:  30 each    Refill:  2    Follow-up: - Return as needed if not improve  Patient verbalizes understanding with the above medical recommendations including the limitation of remote medical advice.  Specific follow-up and call-back criteria were given for patient to follow-up or seek medical care more urgently  if needed.   - Time spent in direct consultation with patient on phone: 11 minutes   [name], [facility] 07/16/2019, 3:38 PM

## 2019-07-16 NOTE — Patient Instructions (Addendum)
Referral sent to [facility]  For arthritis in lumbar spine low back.  Start taking Baclofen (Lioresal) 10m (muscle relaxant) - start with half (cut) to one whole pill at night as needed for next 1-3 nights (may make you drowsy, caution with driving) see how it affects you, then if tolerated increase to one pill 2 to 3 times a day or (every 8 hours as needed)   Please schedule a Follow-up Appointment to: Return if symptoms worsen or fail to improve, for back pain.  If you have any other questions or concerns, please feel free to call the office or send a message through [place] You may also schedule an earlier appointment if necessary.  Additionally, you may be receiving a survey about your experience at our office within a few days to 1 week by e-mail or mail. We value your feedback.  ANobie Putnam DO [facility]

## 2019-08-05 DIAGNOSIS — M7061 Trochanteric bursitis, right hip: Secondary | ICD-10-CM | POA: Diagnosis not present

## 2019-08-05 DIAGNOSIS — M545 Low back pain: Secondary | ICD-10-CM | POA: Diagnosis not present

## 2019-08-14 ENCOUNTER — Encounter: Payer: Self-pay | Admitting: Obstetrics and Gynecology

## 2019-08-14 ENCOUNTER — Other Ambulatory Visit: Payer: Self-pay | Admitting: Obstetrics and Gynecology

## 2019-08-14 ENCOUNTER — Ambulatory Visit (INDEPENDENT_AMBULATORY_CARE_PROVIDER_SITE_OTHER): Payer: 59 | Admitting: Obstetrics and Gynecology

## 2019-08-14 ENCOUNTER — Other Ambulatory Visit: Payer: Self-pay

## 2019-08-14 ENCOUNTER — Ambulatory Visit (INDEPENDENT_AMBULATORY_CARE_PROVIDER_SITE_OTHER): Payer: 59

## 2019-08-14 VITALS — BP 138/82 | Ht 67.0 in | Wt 233.0 lb

## 2019-08-14 DIAGNOSIS — D251 Intramural leiomyoma of uterus: Secondary | ICD-10-CM

## 2019-08-14 DIAGNOSIS — D259 Leiomyoma of uterus, unspecified: Secondary | ICD-10-CM

## 2019-08-14 DIAGNOSIS — Z30431 Encounter for routine checking of intrauterine contraceptive device: Secondary | ICD-10-CM

## 2019-08-14 DIAGNOSIS — N939 Abnormal uterine and vaginal bleeding, unspecified: Secondary | ICD-10-CM

## 2019-08-14 DIAGNOSIS — D252 Subserosal leiomyoma of uterus: Secondary | ICD-10-CM

## 2019-08-14 NOTE — Progress Notes (Signed)
Patient ID: Linda Vega, female   DOB: Nov 29, 1975, 44 y.o.   MRN: [id number]  Reason for Consult: Follow-up (u/s follow up )   Referred by [name] *  Subjective:     HPI:  Linda Vega is a 43 y.o. female.  4.  5 3.  She is following up today for uterine fibroids.  She was seen in August and was concerned given vaginal spotting that her IUD was the correct location.  And pelvic ultrasound was ordered and occurred today.  The vaginal ultrasound showed the IUD in the correct position as well as multiple intramural subserosal and pedunculated fibroids.  She had 2 simple cysts on her bilateral ovaries which were within normal limits.  She reports continued spotting although improved from the heavy menses she was having before.  She has pelvic pain back pain and right-sided lower quadrant pain.  She has some nausea which is persistent.  She is interested in treatment for her fibroids.   Past Medical History:  Diagnosis Date  . Hypertension    Family History  Problem Relation Age of Onset  . Hypertension Mother   . Breast cancer Maternal Aunt 27  . Breast cancer Maternal Grandmother 30   Past Surgical History:  Procedure Laterality Date  . BREAST BIOPSY Right 11/23/2017   US guided breast biopsy  . BREAST CYST ASPIRATION Left    neg  . CESAREAN SECTION    . fibrocystic breast    . LAPAROSCOPY FOR ECTOPIC PREGNANCY      Short Social History:  Social History   Tobacco Use  . Smoking status: Never Smoker  . Smokeless tobacco: Never Used  Substance Use Topics  . Alcohol use: Yes    Comment: occasional, about 3x a month    Allergies  Allergen Reactions  . Shellfish Allergy Hives, Hypertension, Itching, Palpitations, Shortness Of Breath, Swelling and Anaphylaxis  . Chicken Allergy Other (See Comments)    Listed per pt request.  . Eggs Or Egg-Derived Products Hives    Current Outpatient Medications  Medication Sig Dispense Refill  . amLODipine  (NORVASC) 10 MG tablet     . baclofen (LIORESAL) 10 MG tablet Take 0.5-1 tablets (5-10 mg total) by mouth 3 (three) times daily as needed for muscle spasms. 30 each 2  . ibuprofen (ADVIL) 800 MG tablet TAKE 1 TABLET BY MOUTH EVERY 8 HOURS AS NEEDED FOR HEADACHE, MODERATE PAIN OR CRAMPING. 30 tablet 3  . ketoconazole (NIZORAL) 2 % cream   1  . Levonorgestrel (LILETTA) 19.5 MCG/DAY IUD IUD by Intrauterine route.    . meloxicam (MOBIC) 15 MG tablet Take 15 mg by mouth daily.    . Multiple Vitamin (MULTIVITAMIN) tablet Take 1 tablet by mouth daily.    Marland Kitchen omeprazole (PRILOSEC) 20 MG capsule Take 1 capsule (20 mg total) by mouth daily before breakfast. 30 capsule 2  . valACYclovir (VALTREX) 500 MG tablet Take 1 tablet (500 mg total) by mouth 2 (two) times daily. For 3 to 5 days, as needed for flare 30 tablet 5   No current facility-administered medications for this visit.    Review of Systems  Constitutional: Negative for chills, fatigue, fever and unexpected weight change.  HENT: Negative for trouble swallowing.  Eyes: Negative for loss of vision.  Respiratory: Negative for cough, shortness of breath and wheezing.  Cardiovascular: Negative for chest pain, leg swelling, palpitations and syncope.  GI: Negative for abdominal pain, blood in stool, diarrhea, nausea and vomiting.  GU: Negative for difficulty urinating, dysuria, frequency and hematuria.  Musculoskeletal: Negative for back pain, leg pain and joint pain.  Skin: Negative for rash.  Neurological: Negative for dizziness, headaches, light-headedness, numbness and seizures.  Psychiatric: Negative for behavioral problem, confusion, depressed mood and sleep disturbance.        Objective:  Objective   Vitals:   08/14/19 0851  BP: 138/82  Weight: 233 lb (105.7 kg)  Height: 5' 7"  (1.702 m)   Body mass index is 36.49 kg/m.  Physical Exam Vitals and nursing note reviewed.  Constitutional:      Appearance: She is well-developed.    HENT:     Head: Normocephalic and atraumatic.  Eyes:     Pupils: Pupils are equal, round, and reactive to light.  Cardiovascular:     Rate and Rhythm: Normal rate and regular rhythm.  Pulmonary:     Effort: Pulmonary effort is normal. No respiratory distress.  Abdominal:     General: Abdomen is flat.     Palpations: Abdomen is soft.  Skin:    General: Skin is warm and dry.  Neurological:     Mental Status: She is alert and oriented to person, place, and time.  Psychiatric:        Behavior: Behavior normal.        Thought Content: Thought content normal.        Judgment: Judgment normal.         Assessment/Plan:     44 year old G2P1 with a fibroid uterus  Reviewed options with patient in detail regarding treatment for fibroids. Discussed options of treatment of fibroid uterus including uterine artery embolization, Lupron, Orilissa,  laparoscopy and ultrasound, and hysterectomy.  At this time she is most interested in a hysterectomy and would like to consider this surgical options for definitive management.  Discussed which type of hysterectomy which would be advisable which is a laparoscopic-assisted total abdominal hysterectomy. Discussed normal recovery expectations and time off of work.  She will consider and would like to with our surgical scheduler about the date in the future.  She understands that because of Covid levels in the community the surgery will have to be delayed indefinitely.  Continue with current management with IUD.   More than 25 minutes were spent face to face with the patient in the room with more than 50% of the time spent providing counseling and discussing the plan of management.     Adrian Prows MD [facility] 08/14/2019 11:01 AM

## 2019-08-15 ENCOUNTER — Telehealth: Payer: Self-pay

## 2019-08-15 ENCOUNTER — Other Ambulatory Visit: Payer: Self-pay | Admitting: Obstetrics and Gynecology

## 2019-08-15 DIAGNOSIS — D251 Intramural leiomyoma of uterus: Secondary | ICD-10-CM

## 2019-08-15 DIAGNOSIS — R110 Nausea: Secondary | ICD-10-CM

## 2019-08-15 DIAGNOSIS — M5489 Other dorsalgia: Secondary | ICD-10-CM

## 2019-08-15 DIAGNOSIS — D252 Subserosal leiomyoma of uterus: Secondary | ICD-10-CM

## 2019-08-15 MED ORDER — ACETAMINOPHEN-CODEINE #3 300-30 MG PO TABS: 1.0000 | ORAL_TABLET | Freq: Four times a day (QID) | ORAL | 0 refills | Status: DC | PRN

## 2019-08-15 MED ORDER — PROMETHAZINE HCL 25 MG PO TABS: 25.0000 mg | ORAL_TABLET | Freq: Four times a day (QID) | ORAL | 3 refills | Status: DC | PRN

## 2019-08-15 NOTE — Telephone Encounter (Signed)
Pt called triage stating she has recently had an ultrasound. She would like to talk to Dr. Gilman Schmidt about the results and also has questions regarding surgery. CB# [phone]

## 2019-08-15 NOTE — Telephone Encounter (Signed)
Please advise. Thank you

## 2019-08-20 DIAGNOSIS — M545 Low back pain: Secondary | ICD-10-CM | POA: Diagnosis not present

## 2019-08-22 DIAGNOSIS — M545 Low back pain: Secondary | ICD-10-CM | POA: Diagnosis not present

## 2019-08-28 ENCOUNTER — Telehealth: Payer: Self-pay | Admitting: Obstetrics and Gynecology

## 2019-08-28 NOTE — Telephone Encounter (Signed)
Patient returned the call, and OR dates were given. Patient will call back when she decides on a surgery date.

## 2019-08-28 NOTE — Telephone Encounter (Signed)
Lmtrc

## 2019-08-28 NOTE — Telephone Encounter (Signed)
Patient returned the call and is aware of H&P on 10/07/19 @ 4:10pm, Pre-admit Testing to be scheduled, COVID testing on 3/12, and Or on 10/14/19. Patient is aware to quarantine after COVID testing. Patient is aware she may receive calls from the [place] and [facility]. Patient confirmed UMR and no secondary insurance.

## 2019-08-28 NOTE — Telephone Encounter (Signed)
-----   Message from Homero Fellers, MD sent at 08/15/2019  6:46 PM EST ----- Surgery Booking Request Patient Full Name:  Linda Vega  MRN: [id number]  DOB: Feb 12, 1976  Surgeon: Homero Fellers, MD  Requested Surgery Date and Time: when possible Primary Diagnosis AND Code: fibroid uterus D25.9 Secondary Diagnosis and Code:  Surgical Procedure: TLH/BS, cystoscopy L&D Notification: No Admission Status: same day surgery Length of Surgery: 2 hours Special Case Needs: No H&P: Yes Phone Interview???:  Yes Interpreter: No Language:  Medical Clearance:  No Special Scheduling Instructions: none Any known health/anesthesia issues, diabetes, sleep apnea, latex allergy, defibrillator/pacemaker?: No Acuity: P3   (P1 highest, P2 delay may cause harm, P3 low, elective gyn, P4 lowest)

## 2019-08-29 ENCOUNTER — Telehealth: Payer: Self-pay | Admitting: Obstetrics and Gynecology

## 2019-08-29 NOTE — Telephone Encounter (Signed)
Patient would like to discuss the procedure Dr. Gilman Schmidt has scheduled for her (TLH/BS, cystoscopy according to the surgery request), to be sure she is scheduled for what she expected. She would like a call back so this can be resolved prior to her H&P appointment. Patient is aware Dr. Gilman Schmidt is on vacation and will return to clinic on 09/10/19.

## 2019-09-04 NOTE — Telephone Encounter (Signed)
Called, no answer, left message

## 2019-09-08 NOTE — Telephone Encounter (Signed)
Orders complete.

## 2019-09-08 NOTE — Telephone Encounter (Signed)
I called her Friday of last week

## 2019-09-10 DIAGNOSIS — M545 Low back pain: Secondary | ICD-10-CM | POA: Diagnosis not present

## 2019-09-12 ENCOUNTER — Other Ambulatory Visit: Payer: Self-pay

## 2019-09-12 ENCOUNTER — Ambulatory Visit (INDEPENDENT_AMBULATORY_CARE_PROVIDER_SITE_OTHER): Payer: 59 | Admitting: Family Medicine

## 2019-09-12 ENCOUNTER — Encounter: Payer: Self-pay | Admitting: Family Medicine

## 2019-09-12 ENCOUNTER — Telehealth: Payer: Self-pay

## 2019-09-12 VITALS — BP 139/82 | HR 85 | Temp 97.5°F | Ht 67.0 in | Wt 230.0 lb

## 2019-09-12 DIAGNOSIS — R29818 Other symptoms and signs involving the nervous system: Secondary | ICD-10-CM

## 2019-09-12 DIAGNOSIS — I10 Essential (primary) hypertension: Secondary | ICD-10-CM | POA: Diagnosis not present

## 2019-09-12 DIAGNOSIS — H6992 Unspecified Eustachian tube disorder, left ear: Secondary | ICD-10-CM | POA: Diagnosis not present

## 2019-09-12 DIAGNOSIS — F419 Anxiety disorder, unspecified: Secondary | ICD-10-CM | POA: Diagnosis not present

## 2019-09-12 DIAGNOSIS — R002 Palpitations: Secondary | ICD-10-CM | POA: Diagnosis not present

## 2019-09-12 MED ORDER — ESCITALOPRAM OXALATE 10 MG PO TABS: 10.0000 mg | ORAL_TABLET | Freq: Every day | ORAL | 3 refills | Status: DC

## 2019-09-12 MED ORDER — FLUTICASONE PROPIONATE 50 MCG/ACT NA SUSP: 2.0000 | Freq: Every day | NASAL | 3 refills | Status: DC

## 2019-09-12 NOTE — Telephone Encounter (Signed)
Pt is calling about paperwork that was faxed for her upcoming surgery.
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
## 2019-09-12 NOTE — Patient Instructions (Addendum)
Thank you for coming to the office today.  As discussed, it sounds like your symptoms are primarily related to anxiety / adjustment disorder. This is a very common problem and be related to several factors, including life stressors. Start treatment with Escitalopram, take 37m daily for next several weeks. As discussed most anxiety medications are also used for mood disorders such as depression, because they work on similar chemicals in your brain. It may take up to 3-4 weeks for the medicine to take full effect and for you to notice a difference, sometimes you may notice it working sooner, otherwise we may need to adjust the dose.  For most patients with anxiety or mood concerns, we generally recommend referral to establish with a therapist or counselor as well. This has been shown to improve the effectiveness of the medications, and in the future we may be able to taper off medications.   Consider therapy session with:  [facility] [address], [place]Address: [address] [id number]: ([phone] ----------------------------------  [facility] Irish LCSW [address] [phone] Stay tuned for Sleep Study.    Please schedule a Follow-up Appointment to: Return in about 3 months (around 12/10/2019) for 3 month as needed anxiety / med adjust / palpitations.  If you have any other questions or concerns, please feel free to call the office or send a message through [facility] You may also schedule an earlier appointment if necessary.  Additionally, you may be receiving a survey about your experience at our office within a few days to 1 week by e-mail or mail. We value your feedback.  ANobie Putnam DO [facility]

## 2019-09-18 NOTE — Telephone Encounter (Signed)
Pt called yesterday to get an update on FMLA paperwork. Pt told that both [employer] and [employer] paperwork was filled out and signed however I am just awaiting for payment and papers that go with the FMLA.

## 2019-09-24 DIAGNOSIS — M545 Low back pain: Secondary | ICD-10-CM | POA: Diagnosis not present

## 2019-09-29 ENCOUNTER — Other Ambulatory Visit: Payer: Self-pay | Admitting: Family Medicine

## 2019-09-29 DIAGNOSIS — A6000 Herpesviral infection of urogenital system, unspecified: Secondary | ICD-10-CM

## 2019-10-07 ENCOUNTER — Encounter: Payer: Self-pay | Admitting: Obstetrics and Gynecology

## 2019-10-07 ENCOUNTER — Other Ambulatory Visit: Payer: Self-pay

## 2019-10-07 ENCOUNTER — Ambulatory Visit (INDEPENDENT_AMBULATORY_CARE_PROVIDER_SITE_OTHER): Payer: 59 | Admitting: Obstetrics and Gynecology

## 2019-10-07 VITALS — Ht 67.0 in | Wt 236.0 lb

## 2019-10-07 DIAGNOSIS — D251 Intramural leiomyoma of uterus: Secondary | ICD-10-CM

## 2019-10-07 DIAGNOSIS — D252 Subserosal leiomyoma of uterus: Secondary | ICD-10-CM

## 2019-10-07 DIAGNOSIS — Z1231 Encounter for screening mammogram for malignant neoplasm of breast: Secondary | ICD-10-CM | POA: Diagnosis not present

## 2019-10-07 DIAGNOSIS — N938 Other specified abnormal uterine and vaginal bleeding: Secondary | ICD-10-CM | POA: Diagnosis not present

## 2019-10-07 DIAGNOSIS — N939 Abnormal uterine and vaginal bleeding, unspecified: Secondary | ICD-10-CM

## 2019-10-07 DIAGNOSIS — D259 Leiomyoma of uterus, unspecified: Secondary | ICD-10-CM

## 2019-10-07 NOTE — Progress Notes (Signed)
Patient ID: Linda Vega, female   DOB: 1975-10-27, 44 y.o.   MRN: [id number]  Reason for Consult: Pre-op Exam   Referred by [name] *  Subjective:     HPI:  Linda Vega is a 44 y.o. female she is being seen today for presurgical visit.  She is a history of heavy periods and a fibroid uterus.  Recent pelvic ultrasound and January 2021 showed uterus to be enlarged at 12 x 7 x 5.3 cm.  7 fibroids were measured both subserosal intramural and pedunculated.  She will had an IUD inserted which is helped improved her heavy bleeding although she has continued to have pelvic pain and back pain which may be associated with the enlarged fibroid uterus.  She has received extensive counseling regarding her options for management of fibroids and at this time would like to proceed with definitive surgical management by hysterectomy.   Past Medical History:  Diagnosis Date  . Hypertension    Family History  Problem Relation Age of Onset  . Hypertension Mother   . Breast cancer Maternal Aunt 81  . Breast cancer Maternal Grandmother 30   Past Surgical History:  Procedure Laterality Date  . BREAST BIOPSY Right 11/23/2017   US guided breast biopsy  . BREAST CYST ASPIRATION Left    neg  . CESAREAN SECTION    . fibrocystic breast    . LAPAROSCOPY FOR ECTOPIC PREGNANCY      Short Social History:  Social History   Tobacco Use  . Smoking status: Never Smoker  . Smokeless tobacco: Never Used  Substance Use Topics  . Alcohol use: Yes    Comment: occasional, about 3x a month    Allergies  Allergen Reactions  . Shellfish Allergy Hives, Hypertension, Itching, Palpitations, Shortness Of Breath, Swelling and Anaphylaxis  . Chicken Allergy Other (See Comments)    Listed per pt request.  . Eggs Or Egg-Derived Products Hives    Current Outpatient Medications  Medication Sig Dispense Refill  . acetaminophen-codeine (TYLENOL #3) 300-30 MG tablet Take 1 tablet by mouth  every 6 (six) hours as needed for moderate pain or severe pain. 30 tablet 0  . amLODipine (NORVASC) 10 MG tablet Take 10 mg by mouth daily.     Marland Kitchen AUVI-Q 0.3 MG/0.3ML SOAJ injection Inject 0.3 mg into the muscle as needed for anaphylaxis.     . B Complex-C-Folic Acid (STRESS FORMULA PO) Take 1 tablet by mouth daily. GoodBye Stress by OLLY    . baclofen (LIORESAL) 10 MG tablet Take 0.5-1 tablets (5-10 mg total) by mouth 3 (three) times daily as needed for muscle spasms. 30 each 2  . fexofenadine (ALLEGRA) 180 MG tablet Take 180 mg by mouth daily as needed for allergies or rhinitis.    . fluticasone (FLONASE) 50 MCG/ACT nasal spray Place 2 sprays into both nostrils daily. Use for 4-6 weeks then stop and use seasonally or as needed. (Patient taking differently: Place 2 sprays into both nostrils daily as needed (allergies.). ) 16 g 3  . ibuprofen (ADVIL) 800 MG tablet TAKE 1 TABLET BY MOUTH EVERY 8 HOURS AS NEEDED FOR HEADACHE, MODERATE PAIN OR CRAMPING. (Patient taking differently: Take 800 mg by mouth every 8 (eight) hours as needed (pain.). ) 30 tablet 3  . ketoconazole (NIZORAL) 2 % cream Apply 1 application topically 2 (two) times daily as needed (skin irritation/rash.).   1  . Levonorgestrel (LILETTA) 19.5 MCG/DAY IUD IUD 1 each by Intrauterine route once. Good  through 01/2023    . meloxicam (MOBIC) 15 MG tablet Take 15 mg by mouth daily as needed for pain.     . Multiple Vitamin (MULTIVITAMIN WITH MINERALS) TABS tablet Take 1 tablet by mouth daily.    . promethazine (PHENERGAN) 25 MG tablet Take 1 tablet (25 mg total) by mouth every 6 (six) hours as needed for nausea or vomiting. 30 tablet 3  . valACYclovir (VALTREX) 500 MG tablet TAKE 1 TABLET BY MOUTH 2 TIMES A DAILY FOR 3 TO 5 DAYS AS NEEDED FOR FLARE (Patient taking differently: Take 500 mg by mouth 2 (two) times daily as needed (outbreaks). ) 30 tablet 4  . escitalopram (LEXAPRO) 10 MG tablet Take 1 tablet (10 mg total) by mouth daily. (Patient  not taking: Reported on 10/02/2019) 30 tablet 3  . omeprazole (PRILOSEC) 20 MG capsule Take 1 capsule (20 mg total) by mouth daily before breakfast. (Patient not taking: Reported on 10/07/2019) 30 capsule 2   No current facility-administered medications for this visit.    Review of Systems  Constitutional: Negative for chills, fatigue, fever and unexpected weight change.  HENT: Negative for trouble swallowing.  Eyes: Negative for loss of vision.  Respiratory: Negative for cough, shortness of breath and wheezing.  Cardiovascular: Negative for chest pain, leg swelling, palpitations and syncope.  GI: Negative for abdominal pain, blood in stool, diarrhea, nausea and vomiting.  GU: Negative for difficulty urinating, dysuria, frequency and hematuria.  Musculoskeletal: Negative for back pain, leg pain and joint pain.  Skin: Negative for rash.  Neurological: Negative for dizziness, headaches, light-headedness, numbness and seizures.  Psychiatric: Negative for behavioral problem, confusion, depressed mood and sleep disturbance.        Objective:  Objective   Vitals:   10/07/19 1609  Weight: 236 lb (107 kg)  Height: 5' 7"  (1.702 m)   Body mass index is 36.96 kg/m.  Physical Exam Vitals and nursing note reviewed.  Constitutional:      Appearance: She is well-developed.  HENT:     Head: Normocephalic and atraumatic.  Eyes:     Pupils: Pupils are equal, round, and reactive to light.  Cardiovascular:     Rate and Rhythm: Normal rate and regular rhythm.  Pulmonary:     Effort: Pulmonary effort is normal. No respiratory distress.  Skin:    General: Skin is warm and dry.  Neurological:     Mental Status: She is alert and oriented to person, place, and time.  Psychiatric:        Behavior: Behavior normal.        Thought Content: Thought content normal.        Judgment: Judgment normal.        Assessment/Plan:     44 year old with enlarged fibroid uterus symptomatic for menorrhagia  as well as pelvic pain and pressure.  I have had a careful discussion with this patient about all the options available and the risk/benefits of each. I have fully informed this patient that a hysterectomy may subject her to a variety of discomforts and risks: She understands that most patients have surgery with little difficulty, but problems can happen ranging from minor to fatal. These include nausea, vomiting, pain, bleeding, infection, poor healing, hernia, wound separation, vaginal cuff separation or formation of adhesions. Unexpected reactions may occur from any drug or anesthetic given. Unintended injury may occur to other pelvic or abdominal structures such as Fallopian tubes, ovaries, bladder, ureter (tube from kidney to bladder), or bowel. Nerves going  from the pelvis to the legs may be injured. Any such injury may require immediate or later additional surgery to correct the problem. Excessive blood loss requiring transfusion is very unlikely but possible. Dangerous blood clots may form in the legs or lungs. Physical and sexual activity will be restricted in varying degrees for an indeterminate period of time but most often 2-4 weeks. She understands that the plan is to do this laparoscopically, however, there is a chance that this will need to be performed via a larger incision. She may be hospitalized overnight. Finally, she understands that it is impossible to list every possible undesirable effect and that the condition for which surgery is done is not always cured or significantly improved, and in rare cases may be even worse. I have also counseled her extensively about the pros and cons of ovarian conservation versus removal. Ample time was given to answer all questions.    More than 25 minutes were spent face to face with the patient in the room with more than 50% of the time spent providing counseling and discussing the plan of management.     Adrian Prows MD [facility] 10/07/2019 5:50 PM

## 2019-10-07 NOTE — H&P (View-Only) (Signed)
Patient ID: Linda Vega, female   DOB: [birth year], 44 y.o.   MRN: [id number]  Reason for Consult: Pre-op Exam   Referred by [name] *  Subjective:     HPI:  Linda Vega is a 44 y.o. female she is being seen today for presurgical visit.  She is a history of heavy periods and a fibroid uterus.  Recent pelvic ultrasound and January 2021 showed uterus to be enlarged at 12 x 7 x 5.3 cm.  7 fibroids were measured both subserosal intramural and pedunculated.  She will had an IUD inserted which is helped improved her heavy bleeding although she has continued to have pelvic pain and back pain which may be associated with the enlarged fibroid uterus.  She has received extensive counseling regarding her options for management of fibroids and at this time would like to proceed with definitive surgical management by hysterectomy.   Past Medical History:  Diagnosis Date  . Hypertension    Family History  Problem Relation Age of Onset  . Hypertension Mother   . Breast cancer Maternal Aunt 25  . Breast cancer Maternal Grandmother 30   Past Surgical History:  Procedure Laterality Date  . BREAST BIOPSY Right 11/23/2017   US guided breast biopsy  . BREAST CYST ASPIRATION Left    neg  . CESAREAN SECTION    . fibrocystic breast    . LAPAROSCOPY FOR ECTOPIC PREGNANCY      Short Social History:  Social History   Tobacco Use  . Smoking status: Never Smoker  . Smokeless tobacco: Never Used  Substance Use Topics  . Alcohol use: Yes    Comment: occasional, about 3x a month    Allergies  Allergen Reactions  . Shellfish Allergy Hives, Hypertension, Itching, Palpitations, Shortness Of Breath, Swelling and Anaphylaxis  . Chicken Allergy Other (See Comments)    Listed per pt request.  . Eggs Or Egg-Derived Products Hives    Current Outpatient Medications  Medication Sig Dispense Refill  . acetaminophen-codeine (TYLENOL #3) 300-30 MG tablet Take 1 tablet by mouth  every 6 (six) hours as needed for moderate pain or severe pain. 30 tablet 0  . amLODipine (NORVASC) 10 MG tablet Take 10 mg by mouth daily.     Marland Kitchen AUVI-Q 0.3 MG/0.3ML SOAJ injection Inject 0.3 mg into the muscle as needed for anaphylaxis.     . B Complex-C-Folic Acid (STRESS FORMULA PO) Take 1 tablet by mouth daily. GoodBye Stress by OLLY    . baclofen (LIORESAL) 10 MG tablet Take 0.5-1 tablets (5-10 mg total) by mouth 3 (three) times daily as needed for muscle spasms. 30 each 2  . fexofenadine (ALLEGRA) 180 MG tablet Take 180 mg by mouth daily as needed for allergies or rhinitis.    . fluticasone (FLONASE) 50 MCG/ACT nasal spray Place 2 sprays into both nostrils daily. Use for 4-6 weeks then stop and use seasonally or as needed. (Patient taking differently: Place 2 sprays into both nostrils daily as needed (allergies.). ) 16 g 3  . ibuprofen (ADVIL) 800 MG tablet TAKE 1 TABLET BY MOUTH EVERY 8 HOURS AS NEEDED FOR HEADACHE, MODERATE PAIN OR CRAMPING. (Patient taking differently: Take 800 mg by mouth every 8 (eight) hours as needed (pain.). ) 30 tablet 3  . ketoconazole (NIZORAL) 2 % cream Apply 1 application topically 2 (two) times daily as needed (skin irritation/rash.).   1  . Levonorgestrel (LILETTA) 19.5 MCG/DAY IUD IUD 1 each by Intrauterine route once. Good  through 01/2023    . meloxicam (MOBIC) 15 MG tablet Take 15 mg by mouth daily as needed for pain.     . Multiple Vitamin (MULTIVITAMIN WITH MINERALS) TABS tablet Take 1 tablet by mouth daily.    . promethazine (PHENERGAN) 25 MG tablet Take 1 tablet (25 mg total) by mouth every 6 (six) hours as needed for nausea or vomiting. 30 tablet 3  . valACYclovir (VALTREX) 500 MG tablet TAKE 1 TABLET BY MOUTH 2 TIMES A DAILY FOR 3 TO 5 DAYS AS NEEDED FOR FLARE (Patient taking differently: Take 500 mg by mouth 2 (two) times daily as needed (outbreaks). ) 30 tablet 4  . escitalopram (LEXAPRO) 10 MG tablet Take 1 tablet (10 mg total) by mouth daily. (Patient  not taking: Reported on 10/02/2019) 30 tablet 3  . omeprazole (PRILOSEC) 20 MG capsule Take 1 capsule (20 mg total) by mouth daily before breakfast. (Patient not taking: Reported on 10/07/2019) 30 capsule 2   No current facility-administered medications for this visit.    Review of Systems  Constitutional: Negative for chills, fatigue, fever and unexpected weight change.  HENT: Negative for trouble swallowing.  Eyes: Negative for loss of vision.  Respiratory: Negative for cough, shortness of breath and wheezing.  Cardiovascular: Negative for chest pain, leg swelling, palpitations and syncope.  GI: Negative for abdominal pain, blood in stool, diarrhea, nausea and vomiting.  GU: Negative for difficulty urinating, dysuria, frequency and hematuria.  Musculoskeletal: Negative for back pain, leg pain and joint pain.  Skin: Negative for rash.  Neurological: Negative for dizziness, headaches, light-headedness, numbness and seizures.  Psychiatric: Negative for behavioral problem, confusion, depressed mood and sleep disturbance.        Objective:  Objective   Vitals:   10/07/19 1609  Weight: 236 lb (107 kg)  Height: 5' 7"  (1.702 m)   Body mass index is 36.96 kg/m.  Physical Exam Vitals and nursing note reviewed.  Constitutional:      Appearance: She is well-developed.  HENT:     Head: Normocephalic and atraumatic.  Eyes:     Pupils: Pupils are equal, round, and reactive to light.  Cardiovascular:     Rate and Rhythm: Normal rate and regular rhythm.  Pulmonary:     Effort: Pulmonary effort is normal. No respiratory distress.  Skin:    General: Skin is warm and dry.  Neurological:     Mental Status: She is alert and oriented to person, place, and time.  Psychiatric:        Behavior: Behavior normal.        Thought Content: Thought content normal.        Judgment: Judgment normal.        Assessment/Plan:     44 year old with enlarged fibroid uterus symptomatic for menorrhagia  as well as pelvic pain and pressure.  I have had a careful discussion with this patient about all the options available and the risk/benefits of each. I have fully informed this patient that a hysterectomy may subject her to a variety of discomforts and risks: She understands that most patients have surgery with little difficulty, but problems can happen ranging from minor to fatal. These include nausea, vomiting, pain, bleeding, infection, poor healing, hernia, wound separation, vaginal cuff separation or formation of adhesions. Unexpected reactions may occur from any drug or anesthetic given. Unintended injury may occur to other pelvic or abdominal structures such as Fallopian tubes, ovaries, bladder, ureter (tube from kidney to bladder), or bowel. Nerves going  from the pelvis to the legs may be injured. Any such injury may require immediate or later additional surgery to correct the problem. Excessive blood loss requiring transfusion is very unlikely but possible. Dangerous blood clots may form in the legs or lungs. Physical and sexual activity will be restricted in varying degrees for an indeterminate period of time but most often 2-4 weeks. She understands that the plan is to do this laparoscopically, however, there is a chance that this will need to be performed via a larger incision. She may be hospitalized overnight. Finally, she understands that it is impossible to list every possible undesirable effect and that the condition for which surgery is done is not always cured or significantly improved, and in rare cases may be even worse. I have also counseled her extensively about the pros and cons of ovarian conservation versus removal. Ample time was given to answer all questions.    More than 25 minutes were spent face to face with the patient in the room with more than 50% of the time spent providing counseling and discussing the plan of management.     Adrian Prows MD [facility], [facility] 10/07/2019 5:50 PM

## 2019-10-08 ENCOUNTER — Encounter
Admission: RE | Admit: 2019-10-08 | Discharge: 2019-10-08 | Disposition: A | Discharge status: Home / Self Care | Payer: 59 | Source: Ambulatory Visit | Attending: Obstetrics and Gynecology | Admitting: Obstetrics and Gynecology

## 2019-10-08 HISTORY — DX: Anxiety disorder, unspecified: F41.9

## 2019-10-08 NOTE — Patient Instructions (Signed)
Your procedure is scheduled on: 10/14/19 Report to [place]. To find out your arrival time please call [phone] between 1PM - 3PM on 10/13/19.  Remember: Instructions that are not followed completely may result in serious medical risk, up to and including death, or upon the discretion of your surgeon and anesthesiologist your surgery may need to be rescheduled.     _X__ 1. Do not eat food after midnight the night before your procedure.                 No gum chewing or hard candies. You may drink clear liquids up to 2 hours                 before you are scheduled to arrive for your surgery- DO not drink clear                 liquids within 2 hours of the start of your surgery.                 Clear Liquids include:  water, apple juice without pulp, clear carbohydrate                 drink such as Clearfast or Gatorade, Black Coffee or Tea (Do not add                 anything to coffee or tea). Diabetics water only  __X__2.  On the morning of surgery brush your teeth with toothpaste and water, you                 may rinse your mouth with mouthwash if you wish.  Do not swallow any              toothpaste of mouthwash.     _X__ 3.  No Alcohol for 24 hours before or after surgery.   _X__ 4.  Do Not Smoke or use e-cigarettes For 24 Hours Prior to Your Surgery.                 Do not use any chewable tobacco products for at least 6 hours prior to                 surgery.  ____  5.  Bring all medications with you on the day of surgery if instructed.   __X__  6.  Notify your doctor if there is any change in your medical condition      (cold, fever, infections).     Do not wear jewelry, make-up, hairpins, clips or nail polish. Do not wear lotions, powders, or perfumes.  Do not shave 48 hours prior to surgery. Men may shave face and neck. Do not bring valuables to the hospital.    [facility] is not responsible for any belongings or  valuables.  Contacts, dentures/partials or body piercings may not be worn into surgery. Bring a case for your contacts, glasses or hearing aids, a denture cup will be supplied. Leave your suitcase in the car. After surgery it may be brought to your room. For patients admitted to the hospital, discharge time is determined by your treatment team.   Patients discharged the day of surgery will not be allowed to drive home.   Please read over the following fact sheets that you were given:   MRSA Information  __X__ Take these medicines the morning of surgery with A SIP OF WATER:  1. amLODipine (NORVASC) 10 MG tablet  2. fexofenadine (ALLEGRA) 180 MG tablet if needed  3. valACYclovir (VALTREX) 500 MG tablet if needed  4. baclofen (LIORESAL) 10 MG tablet if needed  5.  6.  ____ Fleet Enema (as directed)   __X__ Use CHG Soap/SAGE wipes as directed  ____ Use inhalers on the day of surgery  ____ Stop metformin/Janumet/Farxiga 2 days prior to surgery    ____ Take 1/2 of usual insulin dose the night before surgery. No insulin the morning          of surgery.   ____ Stop Blood Thinners Coumadin/Plavix/Xarelto/Pleta/Pradaxa/Eliquis/Effient/Aspirin  on   Or contact your Surgeon, Cardiologist or Medical Doctor regarding  ability to stop your blood thinners  __X__ Stop Anti-inflammatories 7 days before surgery such as Advil, Ibuprofen, Meloxicam, Motrin,  BC or Goodies Powder, Naprosyn, Naproxen, Aleve, Aspirin May use tylenol otc   __X__ Stop all herbal supplements, fish oil or vitamin E until after surgery.    ____ Bring C-Pap to the hospital.

## 2019-10-10 ENCOUNTER — Encounter
Admission: RE | Admit: 2019-10-10 | Discharge: 2019-10-10 | Disposition: A | Discharge status: Home / Self Care | Payer: 59 | Source: Ambulatory Visit | Attending: Obstetrics and Gynecology | Admitting: Obstetrics and Gynecology

## 2019-10-10 ENCOUNTER — Telehealth: Payer: Self-pay | Admitting: Obstetrics and Gynecology

## 2019-10-10 ENCOUNTER — Other Ambulatory Visit: Payer: Self-pay

## 2019-10-10 DIAGNOSIS — Z01818 Encounter for other preprocedural examination: Secondary | ICD-10-CM | POA: Insufficient documentation

## 2019-10-10 DIAGNOSIS — Z20822 Contact with and (suspected) exposure to covid-19: Secondary | ICD-10-CM | POA: Insufficient documentation

## 2019-10-10 DIAGNOSIS — I10 Essential (primary) hypertension: Secondary | ICD-10-CM | POA: Diagnosis not present

## 2019-10-10 DIAGNOSIS — G4730 Sleep apnea, unspecified: Secondary | ICD-10-CM | POA: Diagnosis not present

## 2019-10-10 LAB — SARS CORONAVIRUS 2 (TAT 6-24 HRS): SARS Coronavirus 2: NEGATIVE

## 2019-10-10 LAB — CBC
HCT: 40.9 % (ref 36.0–46.0)
Hemoglobin: 12.8 g/dL (ref 12.0–15.0)
MCH: 23.1 pg — ABNORMAL LOW (ref 26.0–34.0)
MCHC: 31.3 g/dL (ref 30.0–36.0)
MCV: 73.8 fL — ABNORMAL LOW (ref 80.0–100.0)
Platelets: 286 10*3/uL (ref 150–400)
RBC: 5.54 MIL/uL — ABNORMAL HIGH (ref 3.87–5.11)
RDW: 16 % — ABNORMAL HIGH (ref 11.5–15.5)
WBC: 4.3 10*3/uL (ref 4.0–10.5)
nRBC: 0 % (ref 0.0–0.2)

## 2019-10-10 LAB — TYPE AND SCREEN
ABO/RH(D): A POS
Antibody Screen: NEGATIVE

## 2019-10-10 NOTE — Pre-Procedure Instructions (Signed)
ECG 12-lead8/22/2016 [place] Component Name Value Ref Range  Vent Rate (bpm) 70   PR Interval (msec) 140   QRS Interval (msec) 82   QT Interval (msec) 392   QTc (msec) 423   Other Result Information  This result has an attachment that is not available.  Result Narrative  Normal sinus rhythm RSR' or QR pattern in V1 suggests right ventricular conduction delay Otherwise normal ECG  When compared with ECG of 26-Feb-2014 15:57, No significant change was found I reviewed and concur with this report. Electronically signed [id number], MD, NEIL ([id number]) on 03/22/2015 10:41:58 PM  Status Results Details   Encounter Summary

## 2019-10-10 NOTE — Pre-Procedure Instructions (Signed)
Echocardiogram 2D complete9/09/2014 [place] Component Name Value Ref Range  LV Ejection Fraction (%) 55   Aortic Valve Stenosis Grade none   Aortic Valve Regurgitation Grade none   Mitral Valve Regurgitation Grade trivial   Mitral Valve Stenosis Grade none   Tricuspid Valve Regurgitation Grade mild   LV End Diastolic Diameter (cm) 4.2 cm  LV End Systolic Diameter (cm) 2.7 cm  LV Septum Wall Thickness (cm) 1.1 cm  LV Posterior Wall Thickness (cm) 1.0 cm  Left Atrium Diameter (cm) 3.0 cm  Result Narrative           [facility]           [place], [place]         An affiliate of [facility]         [place] #: [id number]            [place], [place] [address]              Date: 04/02/2015 12:53 PM                                  Adult Female Age: 44 yrs           ECHOCARDIOGRAM REPORT            Outpatient      STUDY:CHEST WALL     TAPE:0000:00: 0:00:00     DUH::THA      ECHO:Yes  DOPPLER:Yes FILE:0000-000-000       MD1:      COLOR:Yes CONTRAST:No    MACHINE:Philips      Height: 10 in    RV BIOPSY:No     3D:No  SOUND QLTY:Moderate      Weight: 215 lb     MEDIUM:None                                  BSA: 2.1 m2 ___________________________________________________________________________________________   REASON:Assess, LV function INDICATION:R03.0 - Elevated blood pressure reading without diag, R00.2 - Heart       palpitations  ___________________________________________________________________________________________ ECHOCARDIOGRAPHIC MEASUREMENTS 2D DIMENSIONS AORTA       Values   Normal Range   MAIN PA     Values   Normal Range      Annulus: nm*     [2.1 - 2.5]        PA Main: nm*    [1.5 - 2.1]     Aorta Sin: 2.6 cm  [2.7 - 3.3]    RIGHT VENTRICLE    ST Junction: nm*    [2.3 - 2.9]        RV Base: nm*    [ < 4.2]     Asc.Aorta: 2.3 cm  [2.3 - 3.1]         RV Mid: nm*    [ < 3.5]  LEFT VENTRICLE                    RV Length: nm*    [ < 8.6]       LVIDd: 4.2 cm  [3.9 - 5.3]    INFERIOR VENA CAVA       LVIDs: 2.7 cm  Max. IVC: nm*    [ <= 2.1]         FS: 34.4 %  [> 25]          Min. IVC: nm*        SWT: 1.1 cm  [0.5 - 0.9]          ------------------        PWT: 1.0 cm  [0.5 - 0.9]          nm* - not measured  LEFT ATRIUM      LA Diam: 3.0 cm  [2.7 - 3.8]    LA A4C Area: 13.6 cm2 [ < 20]     LA Volume: 40.3 ml  [22 - 52]  ___________________________________________________________________________________________ ECHOCARDIOGRAPHIC DESCRIPTIONS  AORTIC ROOT     Size:Normal  Dissection:INDETERM FOR DISSECTION  AORTIC VALVE   Leaflets:Tricuspid     Morphology:Normal   Mobility:Fully mobile  LEFT VENTRICLE     Size:Normal       Anterior:Normal  Contraction:Normal        Lateral:Normal  Closest EF:>55% (Estimated)   Septal:Normal   LV Masses:No Masses       Apical:Normal      HDQ:QIWL LVH      Inferior:Normal                 Posterior:Normal Dias.FxClass:N/A  MITRAL VALVE   Leaflets:Normal       Mobility:Fully mobile  Morphology:Normal  LEFT ATRIUM     Size:Normal       LA Masses:No masses   IA Septum:Normal IAS  MAIN PA     Size:Normal  PULMONIC VALVE  Morphology:Normal       Mobility:Fully mobile  RIGHT VENTRICLE   RV Masses:No Masses        Size:Normal   Free Wall:Normal       Contraction:Normal  TRICUSPID VALVE   Leaflets:Normal       Mobility:Fully mobile  Morphology:Normal  RIGHT ATRIUM     Size:Normal       RA Other:None    RA Mass:No masses  PERICARDIUM     Fluid:No effusion  INFERIOR VENACAVA     Size:Normal Normal respiratory collapse   _____________________________________________________ DOPPLER ECHO and OTHER SPECIAL PROCEDURES   Aortic:No AR         No AS    Mitral:TRIVIAL MR      No MS      MV Inflow E Vel=nm*  MV Annulus E'Vel=nm*      E/E'Ratio=nm*  Tricuspid:MILD TR        No TS  Pulmonary:TRIVIAL PR      No PS     ___________________________________________________________________________________________ INTERPRETATION NORMAL LEFT VENTRICULAR SYSTOLIC FUNCTION  WITH MILD LVH NORMAL RIGHT VENTRICULAR SYSTOLIC FUNCTION MILD VALVULAR REGURGITATION (See above) NO VALVULAR STENOSIS NO PREVIOUS ECHO FOR COMPARISON   ___________________________________________________________________________________________ Electronically signed by: Newman Pies, MD on 04/03/2015 11:40 AM       Performed By: Karie Schwalbe, [place]    Ordering Physician: Sidonie Dickens  ___________________________________________________________________________________________  Other Result Information  Interface, Text Results In - 04/03/2015 11:41 AM EDT                   [place]                      [place]                 An affiliate of [facility]  [place] #: [id number]                       [place], [place] [address]                           Date: 04/02/2015 12:53 PM                                                                  Adult Female Age: 44 yrs                     ECHOCARDIOGRAM REPORT                        Outpatient          STUDY:CHEST WALL          TAPE:0000:00: 0:00:00          DUH::THA           ECHO:Yes   DOPPLER:Yes  FILE:0000-000-000              MD1:          COLOR:Yes  CONTRAST:No       MACHINE:Philips            Height: 66 in      RV BIOPSY:No         3D:No    SOUND QLTY:Moderate           Weight: 215 lb         MEDIUM:None                                                                  BSA: 2.1 m2 ___________________________________________________________________________________________     REASON:Assess, LV function INDICATION:R03.0 - Elevated blood pressure reading without diag, R00.2 - Heart            palpitations  ___________________________________________________________________________________________ ECHOCARDIOGRAPHIC MEASUREMENTS 2D DIMENSIONS AORTA             Values      Normal Range      MAIN PA          Values      Normal Range           Annulus:  nm*       [2.1 - 2.5]                PA Main:  nm*       [1.5 - 2.1]         Aorta Sin:  2.6 cm    [2.7 - 3.3]       RIGHT VENTRICLE       ST Junction:  nm*       [2.3 - 2.9]  RV Base:  nm*       [ < 4.2]         Asc.Aorta:  2.3 cm    [2.3 - 3.1]                 RV Mid:  nm*       [ < 3.5]  LEFT VENTRICLE                                        RV Length:  nm*       [ < 8.6]             LVIDd:  4.2 cm    [3.9 - 5.3]       INFERIOR VENA CAVA             LVIDs:  2.7 cm                              Max. IVC:  nm*       [ <= 2.1]                FS:  34.4 %    [> 25]                    Min. IVC:  nm*               SWT:  1.1 cm    [0.5 - 0.9]                   ------------------               PWT:  1.0 cm    [0.5 - 0.9]                   nm* - not measured  LEFT ATRIUM           LA Diam:  3.0 cm    [2.7 - 3.8]       LA A4C Area:  13.6 cm2  [ < 20]         LA Volume:  40.3 ml   [22 - 52]  ___________________________________________________________________________________________ ECHOCARDIOGRAPHIC DESCRIPTIONS  AORTIC ROOT         Size:Normal    Dissection:INDETERM FOR DISSECTION  AORTIC VALVE     Leaflets:Tricuspid         Morphology:Normal     Mobility:Fully mobile  LEFT VENTRICLE         Size:Normal              Anterior:Normal  Contraction:Normal               Lateral:Normal   Closest EF:>55% (Estimated)      Septal:Normal    LV Masses:No Masses             Apical:Normal          VHQ:IONG LVH            Inferior:Normal                                 Posterior:Normal Dias.FxClass:N/A  MITRAL VALVE     Leaflets:Normal              Mobility:Fully mobile   Morphology:Normal  LEFT ATRIUM         Size:Normal             LA Masses:No masses    IA Septum:Normal IAS  MAIN PA         Size:Normal  PULMONIC VALVE   Morphology:Normal              Mobility:Fully mobile  RIGHT VENTRICLE    RV Masses:No Masses               Size:Normal    Free Wall:Normal           Contraction:Normal  TRICUSPID VALVE     Leaflets:Normal              Mobility:Fully mobile   Morphology:Normal  RIGHT ATRIUM         Size:Normal              RA Other:None      RA Mass:No masses  PERICARDIUM        Fluid:No effusion  INFERIOR VENACAVA         Size:Normal Normal respiratory collapse   _____________________________________________________ DOPPLER ECHO and OTHER SPECIAL PROCEDURES    Aortic:No AR                 No AS     Mitral:TRIVIAL MR            No MS           MV Inflow E Vel=nm*   MV Annulus E'Vel=nm*           E/E'Ratio=nm*  Tricuspid:MILD TR               No TS  Pulmonary:TRIVIAL PR            No PS     ___________________________________________________________________________________________ INTERPRETATION NORMAL LEFT VENTRICULAR SYSTOLIC FUNCTION   WITH MILD LVH NORMAL RIGHT VENTRICULAR SYSTOLIC FUNCTION MILD VALVULAR REGURGITATION (See above) NO VALVULAR STENOSIS NO PREVIOUS ECHO FOR COMPARISON   ___________________________________________________________________________________________ Electronically signed  by: Newman Pies, MD on 04/03/2015 11:40 AM             Performed By: Karie Schwalbe, [place]       Ordering Physician: Sidonie Dickens  ___________________________________________________________________________________________  Status Results Details   Unavailable

## 2019-10-10 NOTE — Pre-Procedure Instructions (Addendum)
SECURE CHAT WITH DR Gilman Schmidt, Denyse Dago AND DR Fredirick LatheQuincy Vega! Just wanted to let you know that anesthesia wants this pt to get medical clearance for abnormal EKG. Her surgery is 3-16. I faxed this clearance along with the EKG to your office as well as Dr Parks Ranger. Thanks!  Dr Fredirick Lathe: Hello. I reviewed the EKG. I have not reviewed the fax request at this time. I just saw this patient recently. I'm booked for this afternoon. We could try to get her in to see me on Monday to review medical clearance. Otherwise, if we referred to Cardiology for cardiac clearanced based on abnormal EKG, I don't know if we could get that arranged for Monday but it may be possible. There isn't much time prior to the 3/16 surgery date.   Britt Bottom RN: OK great I will let her surgeon decide on what they may want to do. Thank you!   Dr Gilman Schmidt: My office is working on having her seen Monday- will be reaching out to patient this afternoon  Dr Fredirick Lathe: Sounds good. From my last office visit. I can share more information if needed. Let me know if need any other input on my end.  April Hawley from Dr Jackson Latino office: [facility] and spoke with [place]. She was able to secure an appt with Dr Ubaldo Glassing on Monday at 11:00am. She did mention that with an abnormal EKG, he most likely would want to do further testing.

## 2019-10-10 NOTE — Telephone Encounter (Signed)
Recd fax from [employer] testing indicating that pt had Abnormal EKG and that medical clearance is needed for scheduled 3/16 surgery with Dr Gilman Schmidt.  Scheduled appt with Dr. Ubaldo Glassing at [facility] on 3/15 @ 11:00am per Gwinda Passe.  Called pt and L/M for her to rtn call  Faxed request for medical clearance to [facility] at 2:18pm fax# [phone]

## 2019-10-10 NOTE — Pre-Procedure Instructions (Signed)
Hi Dr Kayleen Memos! Pt having Hysterectomy with Dr Gilman Schmidt on 3-16. Pt has htn. Ekg done today (computer interpretation) shows septal infarct. The only other EKG done was in 2016 in [place] and only shows the interpretation, not the actual rhythm strip. I copy and pasted that for you in Epic. Had echo in 2016 which is copy and pasted for your review as well. Does pt need clearance?   Makalya Nave, I would send this EKG for review by her primary MD   [facility] will do. Thanks!

## 2019-10-13 DIAGNOSIS — I10 Essential (primary) hypertension: Secondary | ICD-10-CM | POA: Diagnosis not present

## 2019-10-13 DIAGNOSIS — R030 Elevated blood-pressure reading, without diagnosis of hypertension: Secondary | ICD-10-CM | POA: Diagnosis not present

## 2019-10-13 NOTE — Telephone Encounter (Signed)
Pt stopped by the office and handed the 2 medical clearance forms signed by Dr Ubaldo Glassing on 3/15. Form has been sent to be scanned and copy has been added to my surgery notes.  Pt has been cleared for tomorrow's surgery.

## 2019-10-14 ENCOUNTER — Encounter
Admission: RE | Disposition: A | Discharge status: Home / Self Care | Payer: Self-pay | Source: Home / Self Care | Attending: Obstetrics and Gynecology

## 2019-10-14 ENCOUNTER — Other Ambulatory Visit: Payer: Self-pay

## 2019-10-14 ENCOUNTER — Ambulatory Visit
Admission: RE | Admit: 2019-10-14 | Discharge: 2019-10-14 | Disposition: A | Discharge status: Home / Self Care | Payer: 59 | Attending: Obstetrics and Gynecology | Admitting: Obstetrics and Gynecology

## 2019-10-14 ENCOUNTER — Ambulatory Visit: Payer: 59 | Admitting: Anesthesiology

## 2019-10-14 ENCOUNTER — Encounter: Payer: Self-pay | Admitting: Obstetrics and Gynecology

## 2019-10-14 DIAGNOSIS — K660 Peritoneal adhesions (postprocedural) (postinfection): Secondary | ICD-10-CM | POA: Diagnosis not present

## 2019-10-14 DIAGNOSIS — D251 Intramural leiomyoma of uterus: Secondary | ICD-10-CM | POA: Insufficient documentation

## 2019-10-14 DIAGNOSIS — D252 Subserosal leiomyoma of uterus: Secondary | ICD-10-CM | POA: Diagnosis not present

## 2019-10-14 DIAGNOSIS — Z8249 Family history of ischemic heart disease and other diseases of the circulatory system: Secondary | ICD-10-CM | POA: Diagnosis not present

## 2019-10-14 DIAGNOSIS — N888 Other specified noninflammatory disorders of cervix uteri: Secondary | ICD-10-CM | POA: Insufficient documentation

## 2019-10-14 DIAGNOSIS — N938 Other specified abnormal uterine and vaginal bleeding: Secondary | ICD-10-CM | POA: Diagnosis not present

## 2019-10-14 DIAGNOSIS — Z793 Long term (current) use of hormonal contraceptives: Secondary | ICD-10-CM | POA: Insufficient documentation

## 2019-10-14 DIAGNOSIS — D259 Leiomyoma of uterus, unspecified: Secondary | ICD-10-CM | POA: Diagnosis not present

## 2019-10-14 DIAGNOSIS — I10 Essential (primary) hypertension: Secondary | ICD-10-CM | POA: Insufficient documentation

## 2019-10-14 DIAGNOSIS — N879 Dysplasia of cervix uteri, unspecified: Secondary | ICD-10-CM

## 2019-10-14 DIAGNOSIS — Z79899 Other long term (current) drug therapy: Secondary | ICD-10-CM | POA: Insufficient documentation

## 2019-10-14 DIAGNOSIS — D250 Submucous leiomyoma of uterus: Secondary | ICD-10-CM | POA: Diagnosis not present

## 2019-10-14 HISTORY — PX: CYSTOSCOPY: SHX5120

## 2019-10-14 HISTORY — PX: TOTAL LAPAROSCOPIC HYSTERECTOMY WITH SALPINGECTOMY: SHX6742

## 2019-10-14 HISTORY — PX: IUD REMOVAL: SHX5392

## 2019-10-14 LAB — ABO/RH: ABO/RH(D): A POS

## 2019-10-14 LAB — POCT PREGNANCY, URINE: Preg Test, Ur: NEGATIVE

## 2019-10-14 SURGERY — HYSTERECTOMY, TOTAL, LAPAROSCOPIC, WITH SALPINGECTOMY
Anesthesia: General | Site: Vagina | Laterality: Right

## 2019-10-14 MED ORDER — ACETAMINOPHEN 10 MG/ML IV SOLN
INTRAVENOUS | Status: DC | PRN
  Administered 2019-10-14: 1000 mg via INTRAVENOUS

## 2019-10-14 MED ORDER — ROCURONIUM BROMIDE 100 MG/10ML IV SOLN
INTRAVENOUS | Status: DC | PRN
  Administered 2019-10-14 (×2): 20 mg via INTRAVENOUS
  Administered 2019-10-14: 40 mg via INTRAVENOUS
  Administered 2019-10-14 (×3): 10 mg via INTRAVENOUS

## 2019-10-14 MED ORDER — HYDROMORPHONE HCL 1 MG/ML IJ SOLN
INTRAMUSCULAR | Status: DC | PRN
  Administered 2019-10-14 (×2): .5 mg via INTRAVENOUS

## 2019-10-14 MED ORDER — SODIUM CHLORIDE 0.9 % IR SOLN
Status: DC | PRN
  Administered 2019-10-14: 1400 mL

## 2019-10-14 MED ORDER — BUPIVACAINE HCL (PF) 0.25 % IJ SOLN
INTRAMUSCULAR | Status: AC
  Filled 2019-10-14: qty 30

## 2019-10-14 MED ORDER — FENTANYL CITRATE (PF) 100 MCG/2ML IJ SOLN
INTRAMUSCULAR | Status: AC
  Filled 2019-10-14: qty 2

## 2019-10-14 MED ORDER — SUGAMMADEX SODIUM 500 MG/5ML IV SOLN
INTRAVENOUS | Status: AC
  Filled 2019-10-14: qty 5

## 2019-10-14 MED ORDER — KETOROLAC TROMETHAMINE 30 MG/ML IJ SOLN
INTRAMUSCULAR | Status: DC | PRN
  Administered 2019-10-14: 30 mg via INTRAVENOUS

## 2019-10-14 MED ORDER — MIDAZOLAM HCL 2 MG/2ML IJ SOLN
INTRAMUSCULAR | Status: AC
  Filled 2019-10-14: qty 2

## 2019-10-14 MED ORDER — ACETAMINOPHEN 500 MG PO TABS: 1000.0000 mg | ORAL_TABLET | Freq: Four times a day (QID) | ORAL | 0 refills | Status: DC | PRN

## 2019-10-14 MED ORDER — DEXAMETHASONE SODIUM PHOSPHATE 10 MG/ML IJ SOLN
INTRAMUSCULAR | Status: DC | PRN
  Administered 2019-10-14: 10 mg via INTRAVENOUS

## 2019-10-14 MED ORDER — ONDANSETRON HCL 4 MG/2ML IJ SOLN: 4.0000 mg | Freq: Once | INTRAMUSCULAR | Status: DC | PRN

## 2019-10-14 MED ORDER — LACTATED RINGERS IV SOLN: INTRAVENOUS | Status: DC

## 2019-10-14 MED ORDER — ALBUMIN HUMAN 5 % IV SOLN
INTRAVENOUS | Status: AC
  Filled 2019-10-14: qty 500

## 2019-10-14 MED ORDER — PROPOFOL 10 MG/ML IV BOLUS
INTRAVENOUS | Status: AC
  Filled 2019-10-14: qty 20

## 2019-10-14 MED ORDER — HYDROMORPHONE HCL 1 MG/ML IJ SOLN
INTRAMUSCULAR | Status: AC
  Filled 2019-10-14: qty 1

## 2019-10-14 MED ORDER — EPHEDRINE SULFATE 50 MG/ML IJ SOLN
INTRAMUSCULAR | Status: DC | PRN
  Administered 2019-10-14: 5 mg via INTRAVENOUS

## 2019-10-14 MED ORDER — SUGAMMADEX SODIUM 500 MG/5ML IV SOLN
INTRAVENOUS | Status: DC | PRN
  Administered 2019-10-14: 50 mg via INTRAVENOUS
  Administered 2019-10-14: 500 mg via INTRAVENOUS

## 2019-10-14 MED ORDER — CEFAZOLIN SODIUM-DEXTROSE 2-4 GM/100ML-% IV SOLN
2.0000 g | INTRAVENOUS | Status: AC
  Administered 2019-10-14: 2 g via INTRAVENOUS

## 2019-10-14 MED ORDER — OXYCODONE HCL 5 MG PO TABS: 5.0000 mg | ORAL_TABLET | Freq: Four times a day (QID) | ORAL | 0 refills | Status: DC | PRN

## 2019-10-14 MED ORDER — SODIUM CHLORIDE 0.9 % IR SOLN
Status: DC | PRN
  Administered 2019-10-14: 200 mL

## 2019-10-14 MED ORDER — ALBUMIN HUMAN 5 % IV SOLN: INTRAVENOUS | Status: DC | PRN

## 2019-10-14 MED ORDER — FENTANYL CITRATE (PF) 100 MCG/2ML IJ SOLN
INTRAMUSCULAR | Status: AC
  Administered 2019-10-14: 25 ug via INTRAVENOUS
  Filled 2019-10-14: qty 2

## 2019-10-14 MED ORDER — FAMOTIDINE 20 MG PO TABS: 20.0000 mg | ORAL_TABLET | Freq: Once | ORAL | Status: AC

## 2019-10-14 MED ORDER — IBUPROFEN 600 MG PO TABS: 600.0000 mg | ORAL_TABLET | Freq: Four times a day (QID) | ORAL | 0 refills | Status: DC | PRN

## 2019-10-14 MED ORDER — SUCCINYLCHOLINE CHLORIDE 20 MG/ML IJ SOLN
INTRAMUSCULAR | Status: DC | PRN
  Administered 2019-10-14: 100 mg via INTRAVENOUS

## 2019-10-14 MED ORDER — BUPIVACAINE LIPOSOME 1.3 % IJ SUSP
INTRAMUSCULAR | Status: AC
  Filled 2019-10-14: qty 20

## 2019-10-14 MED ORDER — ONDANSETRON HCL 4 MG/2ML IJ SOLN
INTRAMUSCULAR | Status: DC | PRN
  Administered 2019-10-14 (×2): 4 mg via INTRAVENOUS

## 2019-10-14 MED ORDER — FENTANYL CITRATE (PF) 100 MCG/2ML IJ SOLN
25.0000 ug | INTRAMUSCULAR | Status: DC | PRN
  Administered 2019-10-14 (×3): 25 ug via INTRAVENOUS

## 2019-10-14 MED ORDER — GLYCOPYRROLATE 0.2 MG/ML IJ SOLN
INTRAMUSCULAR | Status: DC | PRN
  Administered 2019-10-14: .2 mg via INTRAVENOUS

## 2019-10-14 MED ORDER — PROPOFOL 10 MG/ML IV BOLUS
INTRAVENOUS | Status: DC | PRN
  Administered 2019-10-14: 160 mg via INTRAVENOUS

## 2019-10-14 MED ORDER — ACETAMINOPHEN NICU IV SYRINGE 10 MG/ML
INTRAVENOUS | Status: AC
  Filled 2019-10-14: qty 1

## 2019-10-14 MED ORDER — FENTANYL CITRATE (PF) 100 MCG/2ML IJ SOLN
INTRAMUSCULAR | Status: DC | PRN
  Administered 2019-10-14 (×3): 50 ug via INTRAVENOUS

## 2019-10-14 MED ORDER — PHENYLEPHRINE HCL (PRESSORS) 10 MG/ML IV SOLN
INTRAVENOUS | Status: DC | PRN
  Administered 2019-10-14: 100 ug via INTRAVENOUS

## 2019-10-14 MED ORDER — CEFAZOLIN SODIUM-DEXTROSE 2-4 GM/100ML-% IV SOLN
INTRAVENOUS | Status: AC
  Filled 2019-10-14: qty 100

## 2019-10-14 MED ORDER — MIDAZOLAM HCL 2 MG/2ML IJ SOLN
INTRAMUSCULAR | Status: DC | PRN
  Administered 2019-10-14: 2 mg via INTRAVENOUS

## 2019-10-14 MED ORDER — FAMOTIDINE 20 MG PO TABS
ORAL_TABLET | ORAL | Status: AC
  Administered 2019-10-14: 20 mg via ORAL
  Filled 2019-10-14: qty 1

## 2019-10-14 SURGICAL SUPPLY — 55 items
BAG URINE DRAIN 2000ML AR STRL (UROLOGICAL SUPPLIES) ×5 IMPLANT
BLADE SURG SZ11 CARB STEEL (BLADE) ×5 IMPLANT
CANISTER SUCT 1200ML W/VALVE (MISCELLANEOUS) ×5 IMPLANT
CATH FOLEY 2WAY  5CC 16FR (CATHETERS) ×1
CATH URTH 16FR FL 2W BLN LF (CATHETERS) ×4 IMPLANT
CHLORAPREP W/TINT 26 (MISCELLANEOUS) ×5 IMPLANT
COVER LIGHT HANDLE STERIS (MISCELLANEOUS) ×1 IMPLANT
DEFOGGER SCOPE WARMER CLEARIFY (MISCELLANEOUS) ×5 IMPLANT
DERMABOND ADVANCED (GAUZE/BANDAGES/DRESSINGS) ×1
DERMABOND ADVANCED .7 DNX12 (GAUZE/BANDAGES/DRESSINGS) ×4 IMPLANT
DEVICE SUTURE ENDOST 10MM (ENDOMECHANICALS) ×1 IMPLANT
DRAPE CAMERA CLOSED 9X96 (DRAPES) ×5 IMPLANT
DRSG TEGADERM 2X2.25 PEDS (GAUZE/BANDAGES/DRESSINGS) ×20 IMPLANT
GAUZE 4X4 16PLY RFD (DISPOSABLE) ×5 IMPLANT
GLOVE BIOGEL PI IND STRL 6.5 (GLOVE) ×4 IMPLANT
GLOVE BIOGEL PI INDICATOR 6.5 (GLOVE) ×1
GLOVE SURG SYN 6.5 ES PF (GLOVE) ×8 IMPLANT
GLOVE SURG SYN 6.5 PF PI (GLOVE) ×8 IMPLANT
GOWN STRL REUS W/ TWL LRG LVL3 (GOWN DISPOSABLE) ×12 IMPLANT
GOWN STRL REUS W/TWL LRG LVL3 (GOWN DISPOSABLE) ×3
GRASPER SUT TROCAR 14GX15 (MISCELLANEOUS) ×1 IMPLANT
IRRIGATION STRYKERFLOW (MISCELLANEOUS) IMPLANT
IRRIGATOR STRYKERFLOW (MISCELLANEOUS) ×4
IV LACTATED RINGERS 1000ML (IV SOLUTION) ×10 IMPLANT
KIT PINK PAD W/HEAD ARE REST (MISCELLANEOUS) ×4
KIT PINK PAD W/HEAD ARM REST (MISCELLANEOUS) ×4 IMPLANT
KIT TURNOVER CYSTO (KITS) ×10 IMPLANT
MANIPULATOR VCARE LG CRV RETR (MISCELLANEOUS) ×1 IMPLANT
MANIPULATOR VCARE SML CRV RETR (MISCELLANEOUS) IMPLANT
MANIPULATOR VCARE STD CRV RETR (MISCELLANEOUS) IMPLANT
NS IRRIG 1000ML POUR BTL (IV SOLUTION) ×5 IMPLANT
NS IRRIG 500ML POUR BTL (IV SOLUTION) ×5 IMPLANT
OCCLUDER COLPOPNEUMO (BALLOONS) ×5 IMPLANT
PACK GYN LAPAROSCOPIC (MISCELLANEOUS) ×5 IMPLANT
PAD OB MATERNITY 4.3X12.25 (PERSONAL CARE ITEMS) ×5 IMPLANT
PAD PREP 24X41 OB/GYN DISP (PERSONAL CARE ITEMS) ×5 IMPLANT
PORT ACCESS TROCAR AIRSEAL 5 (TROCAR) ×1 IMPLANT
SCISSORS METZENBAUM CVD 33 (INSTRUMENTS) ×1 IMPLANT
SET CYSTO W/LG BORE CLAMP LF (SET/KITS/TRAYS/PACK) ×5 IMPLANT
SET TRI-LUMEN FLTR TB AIRSEAL (TUBING) ×5 IMPLANT
SHEARS HARMONIC ACE PLUS 36CM (ENDOMECHANICALS) ×1 IMPLANT
SLEEVE ENDOPATH XCEL 5M (ENDOMECHANICALS) ×5 IMPLANT
SPONGE GAUZE 2X2 8PLY STRL LF (GAUZE/BANDAGES/DRESSINGS) ×20 IMPLANT
SURGILUBE 2OZ TUBE FLIPTOP (MISCELLANEOUS) ×5 IMPLANT
SUT ENDO VLOC 180-0-8IN (SUTURE) ×1 IMPLANT
SUT MNCRL 4-0 (SUTURE) ×1
SUT MNCRL 4-0 27XMFL (SUTURE) ×3
SUT VIC AB 0 CT1 36 (SUTURE) ×8 IMPLANT
SUT VICRYL 0 AB UR-6 (SUTURE) ×1 IMPLANT
SUTURE MNCRL 4-0 27XMF (SUTURE) ×4 IMPLANT
SYR 10ML LL (SYRINGE) ×5 IMPLANT
SYR 50ML LL SCALE MARK (SYRINGE) ×5 IMPLANT
TROCAR PORT AIRSEAL 8X100 (TROCAR) ×6 IMPLANT
TROCAR XCEL NON-BLD 11X100MML (ENDOMECHANICALS) ×1 IMPLANT
TROCAR XCEL NON-BLD 5MMX100MML (ENDOMECHANICALS) ×5 IMPLANT

## 2019-10-14 NOTE — Transfer of Care (Signed)
Immediate Anesthesia Transfer of Care Note  Patient: Linda Vega  Procedure(s) Performed: TOTAL LAPAROSCOPIC HYSTERECTOMY WITH BILATERAL SALPINGECTOMY (N/A Abdomen) CYSTOSCOPY (N/A Bladder)  Patient Location: PACU  Anesthesia Type:General  Level of Consciousness: drowsy and responds to stimulation  Airway & Oxygen Therapy: Patient Spontanous Breathing and Patient connected to face mask oxygen  Post-op Assessment: Report given to RN and Post -op Vital signs reviewed and stable  Post vital signs: Reviewed and stable  Last Vitals:  Vitals Value Taken Time  BP 115/80 10/14/19 1407  Temp    Pulse 89 10/14/19 1413  Resp 8 10/14/19 1413  SpO2 100 % 10/14/19 1413  Vitals shown include unvalidated device data.  Last Pain:  Vitals:   10/14/19 0805  TempSrc: Tympanic  PainSc: 0-No pain         Complications: No apparent anesthesia complications

## 2019-10-14 NOTE — Anesthesia Postprocedure Evaluation (Signed)
Anesthesia Post Note  Patient: TEONIA YAGER  Procedure(s) Performed: TOTAL LAPAROSCOPIC HYSTERECTOMY WITH BILATERAL SALPINGECTOMY (N/A Abdomen) CYSTOSCOPY (N/A Bladder)  Patient location during evaluation: PACU Anesthesia Type: General Level of consciousness: awake and alert and oriented Pain management: pain level controlled Vital Signs Assessment: post-procedure vital signs reviewed and stable Respiratory status: spontaneous breathing, nonlabored ventilation and respiratory function stable Cardiovascular status: blood pressure returned to baseline and stable Postop Assessment: no signs of nausea or vomiting Anesthetic complications: no     Last Vitals:  Vitals:   10/14/19 1446 10/14/19 1454  BP: (!) 94/58   Pulse: 85 93  Resp: (!) 6 (!) 8  Temp:    SpO2: 100% 98%    Last Pain:  Vitals:   10/14/19 1454  TempSrc:   PainSc: 5                  Ilaria Much

## 2019-10-14 NOTE — Anesthesia Procedure Notes (Signed)
Procedure Name: Intubation Performed by: Kelton Pillar, CRNA Pre-anesthesia Checklist: Patient identified, Emergency Drugs available, Suction available and Patient being monitored Patient Re-evaluated:Patient Re-evaluated prior to induction Oxygen Delivery Method: Circle system utilized Preoxygenation: Pre-oxygenation with 100% oxygen Induction Type: IV induction Ventilation: Mask ventilation without difficulty Laryngoscope Size: McGraph and 3 Grade View: Grade I Tube type: Oral Tube size: 7.0 mm Number of attempts: 1 Airway Equipment and Method: Stylet Placement Confirmation: ETT inserted through vocal cords under direct vision,  positive ETCO2,  CO2 detector and breath sounds checked- equal and bilateral Secured at: 21 cm Tube secured with: Tape Dental Injury: Teeth and Oropharynx as per pre-operative assessment

## 2019-10-14 NOTE — Interval H&P Note (Signed)
History and Physical Interval Note:  10/14/2019 8:53 AM  Linda Vega  has presented today for surgery, with the diagnosis of Fibroid uterus D25.9.  The various methods of treatment have been discussed with the patient and family. After consideration of risks, benefits and other options for treatment, the patient has consented to  Procedure(s): TOTAL LAPAROSCOPIC HYSTERECTOMY WITH BILATERAL SALPINGECTOMY (N/A) CYSTOSCOPY (N/A) as a surgical intervention.  The patient's history has been reviewed, patient examined, no change in status, stable for surgery.  I have reviewed the patient's chart and labs.  Questions were answered to the patient's satisfaction.     [place]

## 2019-10-14 NOTE — Op Note (Signed)
Operative Report:  PRE-OP DIAGNOSIS: Fibroid uterus D25.9   POST-OP DIAGNOSIS: Fibroid uterus D25.9   PROCEDURE: Procedure(s): TOTAL LAPAROSCOPIC HYSTERECTOMY WITH RIGHT SALPINGECTOMY CYSTOSCOPY, LYSIS OF ADHESIONS- 60 minutes  SURGEON: Adrian Prows MD  ASSISTANT: Prentice Docker MD   ANESTHESIA: General endotracheal anesthesia  ESTIMATED BLOOD LOSS: 100 cc  SPECIMENS: Uterus, right fallopian tube, IUD  COMPLICATIONS: None  DISPOSITION: stable to PACU  FINDINGS: Intraabdominal adhesions were noted. Midline omental adhesions and adhesion of the uterus to the abdominal wall.   PROCEDURE:  . The patient was taken to the OR where anesthesia was administed. She was prepped and draped in the normal sterile fashion in the dorsal lithotomy position in the [place] stirrups. A time out was performed. A Graves speculum was inserted, the cervix was grasped with a single tooth tenaculum and the endometrial cavity was sounded. The cervix was progressively dilated to a size 18 [place] with [employer] dilators. A V-Care uterine manipulator was inserted in the usual fashion without incident. Gloves were changed and attention was turned to the abdomen.    An infraumbilical verticall 10 mm skin incision was made with the scalpel after local anesthesia applied to the skin. A 5 mm trocar was inserted under direct visualization.  A pneumoperitoneum was obtained by insufflation of CO2 (opening pressure of 66mHg) to 122mHg. A diagnostic laparoscopy was performed yielding the previously described findings. Attention was turned to the left lower quadrant where after visualization of the inferior epigastric vessels a 47m47mkin incision was made with the scalpel. A 5 mm laparoscopic port was inserted. The umbilical port was exchanged for a 8 mm Airseal port. Attention was turned to the right lower quadrant where after visualization of the inferior epigastric vessels a 47mm19min incision was made with the scalpel. A  5 mm laparoscopic port was inserted.    . There were significant adhesions of the omentum and anterior uterus to the abdominal wall.  The harmonic scalpel was used to coagulate and transect the omentum from the abdominal wall.  The harmonic scalpel was then used to carefully dissected the anterior uterus away from the abdominal wall.  Great care was taken to avoid injuring the bladder.  Omental adhesions were thin and easily dissected.  Adhesions of the anterior uterus were thick.  More than 60 minutes were spent carefully dissecting these adhesions.   . Once the adhesions were dissected, attention was then turned to the right fallopian tube which was recognized by visualization of the fimbria. The tube was excised to its attachment to the uterus. Attention was turned to the right aspect of the uterus, where after visualization of the ureter, the round ligament was coagulated and transected using the 47mm 347mmonic Scapel. The anterior and posterior leafs of the broad ligament were dissected off as the anterior one was coagulated and transected in a caudal direction towards the cuff of the uterine manipulator.   The uterine-ovarian ligament and its blood vessels were carefully coagulated and transected using the Harmonic scapel.  Attention was turned to the left aspect of the uterus where the same procedure was performed. The left fallopian tube was surgically absent from her prior ectopic pregnancy. The vesicouterine reflection of the peritoneum was dissected with the harmonic scapel and the bladder flap was created bluntly.  The uterine vessels were coagulated and transected bilaterally using first bipolar cautery and then the harmonic scapel. A 360 degree, circumferential colpotomy was done to completely amputate the uterus with cervix and tubes. Once the specimen  was amputated it was delivered through the vagina.   . The colpotomy was repaired in a simple running fashion using a delayed absorbable suture  with an endo-stitch device.  Vaginal exam confirmed complete closure.  The cavity was copiously irrigated. A survey of the pelvic cavity revealed adequate hemostasis and no injury to bowel, bladder, or ureter.   . A diagnostic cystoscopy was performed using saline distension of bladder with no lesions or injuries noted.  Bilateral urine flow from each ureteral orifice was visualized.  . At this point the procedure was finalized. umbilical fascia incision was closed with a vicryl suture using the fascia closure device. All the instruments were removed from the patient's body. Gas was expelled and patient is leveled.  Incisions are closed with 4-0 monocryl and skin adhesive.    . Patient was taken to the recovery room in stable condition.  All sponge, instrument, and needle counts are correct x2.    . Dr. Glennon Mac assisted in all portions of this procedure.  He assisted with dissection, coagulation, transection of tissues, and manipulation of the uterus, .  This was a high-level case requiring high-level surgical assistant.   Adrian Prows MD [facility] 10/14/2019 2:09 PM

## 2019-10-14 NOTE — Anesthesia Preprocedure Evaluation (Addendum)
Anesthesia Evaluation  Patient identified by MRN, date of birth, ID band Patient awake    Reviewed: Allergy & Precautions, NPO status , Patient's Chart, lab work & pertinent test results  Airway Mallampati: I  TM Distance: >3 FB     Dental  (+) Teeth Intact   Pulmonary neg pulmonary ROS,    Pulmonary exam normal        Cardiovascular hypertension, Pt. on medications Normal cardiovascular exam     Neuro/Psych  Headaches, PSYCHIATRIC DISORDERS Anxiety    GI/Hepatic negative GI ROS, Neg liver ROS,   Endo/Other  negative endocrine ROS  Renal/GU negative Renal ROS  Female GU complaint     Musculoskeletal  (+) Arthritis , Osteoarthritis,    Abdominal (+) + obese,   Peds negative pediatric ROS (+)  Hematology  (+) anemia ,   Anesthesia Other Findings   Reproductive/Obstetrics                            Anesthesia Physical Anesthesia Plan  ASA: II  Anesthesia Plan: General   Post-op Pain Management:    Induction: Intravenous  PONV Risk Score and Plan:   Airway Management Planned: Oral ETT  Additional Equipment:   Intra-op Plan:   Post-operative Plan: Extubation in OR  Informed Consent: I have reviewed the patients History and Physical, chart, labs and discussed the procedure including the risks, benefits and alternatives for the proposed anesthesia with the patient or authorized representative who has indicated his/her understanding and acceptance.     Dental advisory given  Plan Discussed with: CRNA and Surgeon  Anesthesia Plan Comments:         Anesthesia Quick Evaluation

## 2019-10-14 NOTE — Discharge Instructions (Signed)
AMBULATORY SURGERY  DISCHARGE INSTRUCTIONS   1) The drugs that you were given will stay in your system until tomorrow so for the next 24 hours you should not:  A) Drive an automobile B) Make any legal decisions C) Drink any alcoholic beverage   2) You may resume regular meals tomorrow.  Today it is better to start with liquids and gradually work up to solid foods.  You may eat anything you prefer, but it is better to start with liquids, then soup and crackers, and gradually work up to solid foods.   3) Please notify your doctor immediately if you have any unusual bleeding, trouble breathing, redness and pain at the surgery site, drainage, fever, or pain not relieved by medication. 4)   5) Your post-operative visit with Dr.                                     is: Date:                        Time:    Please call to schedule your post-operative visit.  6) Additional Instructions:     Laparoscopically Assisted Vaginal Hysterectomy, Care After This sheet gives you information about how to care for yourself after your procedure. Your health care provider may also give you more specific instructions. If you have problems or questions, contact your health care provider. What can I expect after the procedure? After the procedure, it is common to have:  Soreness and numbness in your incision areas.  Abdominal pain. You will be given pain medicine to control it.  Vaginal bleeding and discharge. You will need to use a sanitary napkin after this procedure.  Sore throat from the breathing tube that was inserted during surgery. Follow these instructions at home: Medicines  Take over-the-counter and prescription medicines only as told by your health care provider.  Do not take aspirin. This medicines can cause bleeding.  Do not drive or use heavy machinery while taking prescription pain medicine.  Do not drive for 24 hours if you were given a medicine to help you relax (sedative)  during the procedure. Incision care   Follow instructions from your health care provider about how to take care of your incisions. Make sure you: ? Wash your hands with soap and water before you change your bandage (dressing). If soap and water are not available, use hand sanitizer. ? Change your dressing as told by your health care provider. ? Leave stitches (sutures), skin glue, or adhesive strips in place. These skin closures may need to stay in place for 2 weeks or longer. If adhesive strip edges start to loosen and curl up, you may trim the loose edges. Do not remove adhesive strips completely unless your health care provider tells you to do that.  Check your incision area every day for signs of infection. Check for: ? Redness, swelling, or pain. ? Fluid or blood. ? Warmth. ? Pus or a bad smell. Activity  Get regular exercise as told by your health care provider. You may be told to take short walks every day and go farther each time.  Return to your normal activities as told by your health care provider. Ask your health care provider what activities are safe for you.  Do not douche, use tampons, or have sexual intercourse for at least 12 weeks, or until your health  care provider gives you permission.  Do not lift anything that is heavier than 10 lb (4.5 kg), or the limit that your health care provider tells you, until he or she says that it is safe. General instructions  Do not take baths, swim, or use a hot tub until your health care provider approves. Take showers instead of baths.  Do not drive for 24 hours if you received a sedative.  Do not drive or operate heavy machinery while taking prescription pain medicine.  To prevent or treat constipation while you are taking prescription pain medicine, your health care provider may recommend that you: ? Drink enough fluid to keep your urine clear or pale yellow. ? Take over-the-counter or prescription medicines. ? Eat foods that  are high in fiber, such as fresh fruits and vegetables, whole grains, and beans. ? Limit foods that are high in fat and processed sugars, such as fried and sweet foods.  Keep all follow-up visits as told by your health care provider. This is important. Contact a health care provider if:  You have signs of infection, such as: ? Redness, swelling, or pain around your incision sites. ? Fluid or blood coming from an incision. ? An incision that feels warm to the touch. ? Pus or a bad smell coming from an incision.  Your incision breaks open.  Your pain medicine is not helping.  You feel dizzy or light-headed.  You have pain or bleeding when you urinate.  You have persistent nausea and vomiting.  You have blood, pus, or a bad-smelling discharge from your vagina. Get help right away if:  You have a fever.  You have severe abdominal pain.  You have chest pain.  You have shortness of breath.  You faint.  You have pain, swelling, or redness in your leg.  You have heavy bleeding from your vagina. Summary  After the procedure, it is common to have abdominal pain and vaginal bleeding.  You should not drive or lift heavy objects until your health care provider says that it is safe.  Contact your health care provider if you have any symptoms of infection, excessive vaginal bleeding, nausea, vomiting, or shortness of breath. This information is not intended to replace advice given to you by your health care provider. Make sure you discuss any questions you have with your health care provider. Document Revised: 06/29/2017 Document Reviewed: 09/12/2016 Elsevier Patient Education  2020 [employer].

## 2019-10-15 ENCOUNTER — Telehealth: Payer: Self-pay

## 2019-10-15 ENCOUNTER — Other Ambulatory Visit: Payer: Self-pay | Admitting: Obstetrics and Gynecology

## 2019-10-15 DIAGNOSIS — R141 Gas pain: Secondary | ICD-10-CM

## 2019-10-15 LAB — SURGICAL PATHOLOGY

## 2019-10-15 MED ORDER — SIMETHICONE 80 MG PO CHEW: 80.0000 mg | CHEWABLE_TABLET | Freq: Four times a day (QID) | ORAL | 0 refills | Status: DC | PRN

## 2019-10-15 NOTE — Telephone Encounter (Signed)
Pt has surgery last night; is having an issue with gas; can she take ______ gas pill to relieve some of the pressure?  [place]

## 2019-10-15 NOTE — Telephone Encounter (Signed)
Pt states she has a lot of phlegm in her throat she can't get out and also has a lot of gas in abdomen and shoulders.  The pill for gas she is asking about is Lactade pill.  Adv can gargle c warm salt water; that may help c phlegm as well as water/ hot beverages.  Asked CRS about lactade pill.  She states that will be fine and she can send in rx for [place] as well.  Pt aware.

## 2019-10-17 DIAGNOSIS — R0789 Other chest pain: Secondary | ICD-10-CM | POA: Diagnosis not present

## 2019-10-17 DIAGNOSIS — J984 Other disorders of lung: Secondary | ICD-10-CM | POA: Diagnosis not present

## 2019-10-17 DIAGNOSIS — R102 Pelvic and perineal pain: Secondary | ICD-10-CM | POA: Diagnosis not present

## 2019-10-17 DIAGNOSIS — R0602 Shortness of breath: Secondary | ICD-10-CM | POA: Diagnosis not present

## 2019-10-17 DIAGNOSIS — I10 Essential (primary) hypertension: Secondary | ICD-10-CM | POA: Diagnosis not present

## 2019-10-17 DIAGNOSIS — R935 Abnormal findings on diagnostic imaging of other abdominal regions, including retroperitoneum: Secondary | ICD-10-CM | POA: Diagnosis not present

## 2019-10-17 DIAGNOSIS — Z90710 Acquired absence of both cervix and uterus: Secondary | ICD-10-CM | POA: Diagnosis not present

## 2019-10-17 DIAGNOSIS — R112 Nausea with vomiting, unspecified: Secondary | ICD-10-CM | POA: Diagnosis not present

## 2019-10-17 DIAGNOSIS — R0781 Pleurodynia: Secondary | ICD-10-CM | POA: Diagnosis not present

## 2019-10-17 DIAGNOSIS — R0600 Dyspnea, unspecified: Secondary | ICD-10-CM | POA: Diagnosis not present

## 2019-10-17 DIAGNOSIS — R1013 Epigastric pain: Secondary | ICD-10-CM | POA: Diagnosis not present

## 2019-10-17 DIAGNOSIS — R918 Other nonspecific abnormal finding of lung field: Secondary | ICD-10-CM | POA: Diagnosis not present

## 2019-10-17 NOTE — Progress Notes (Signed)
Spoke with patient via phone.  POD3 TLH, BS, cystoscopy with extensive lysis of adhesions and prolonged surgical time.  The patient is reporting chest pressure and the sensation of being unable to take a deep breath.  No nausea or emesis.  I think in the setting of a prolonged surgery, with prolonged immobilization, PE needs to be ruled out.  The patient has not other cardiac history other than a history of CHTN.  I advised the patient that ruling out PE particularly if vitals signs are suggestive of PE on evaluation in ER would be prudent.

## 2019-10-22 ENCOUNTER — Encounter: Payer: Self-pay | Admitting: Obstetrics and Gynecology

## 2019-10-22 ENCOUNTER — Ambulatory Visit (INDEPENDENT_AMBULATORY_CARE_PROVIDER_SITE_OTHER): Payer: 59 | Admitting: Obstetrics and Gynecology

## 2019-10-22 ENCOUNTER — Other Ambulatory Visit: Payer: Self-pay

## 2019-10-22 VITALS — BP 148/88 | Ht 67.0 in | Wt 231.0 lb

## 2019-10-22 DIAGNOSIS — Z90710 Acquired absence of both cervix and uterus: Secondary | ICD-10-CM

## 2019-10-22 DIAGNOSIS — Z48816 Encounter for surgical aftercare following surgery on the genitourinary system: Secondary | ICD-10-CM

## 2019-10-22 NOTE — Progress Notes (Signed)
  Postoperative Follow-up Patient presents post op from laparoscopic assisted vaginal hysterectomy for fibroids, 1 week ago.  Subjective: Patient reports marked improvement in her preop symptoms. Eating a regular diet without difficulty. Pain is controlled with current analgesics. Medications being used: acetaminophen and ibuprofen (OTC).  Activity: normal activities of daily living. Patient reports additional symptom's since surgery of occasional brief episodes of shortness of breath. Occurring about 5 times a day  Objective: BP (!) 148/88   Ht 5' 7"  (1.702 m)   Wt 231 lb (104.8 kg)   LMP 10/04/2019 (Exact Date)   BMI 36.18 kg/m   SPO2, 98% RA HR 90  Physical Exam Constitutional:      Appearance: She is well-developed.  Genitourinary:     Vagina and uterus normal.     No lesions in the vagina.     No cervical motion tenderness.     No right or left adnexal mass present.  HENT:     Head: Normocephalic and atraumatic.  Neck:     Thyroid: No thyromegaly.  Cardiovascular:     Rate and Rhythm: Normal rate and regular rhythm.     Heart sounds: Normal heart sounds.  Pulmonary:     Effort: Pulmonary effort is normal. No respiratory distress.     Breath sounds: Normal breath sounds. No wheezing or rales.  Chest:     Breasts:        Right: No inverted nipple, mass, nipple discharge or skin change.        Left: No inverted nipple, mass, nipple discharge or skin change.  Abdominal:     General: Abdomen is flat. Bowel sounds are normal. There is no distension.     Palpations: Abdomen is soft. There is no mass.     Comments: Incisions clean, dry, intact  Musculoskeletal:     Cervical back: Neck supple.  Neurological:     Mental Status: She is alert and oriented to person, place, and time.  Skin:    General: Skin is warm and dry.  Psychiatric:        Behavior: Behavior normal.        Thought Content: Thought content normal.        Judgment: Judgment normal.  Vitals reviewed.      Assessment: s/p :  laparoscopic assisted vaginal hysterectomy, total laparoscopic hysterectomy with bilateral salpingectomy, cesarean section, tubal ligation, endometrial ablation and LEEP stable  Plan: Patient has done well after surgery with no apparent complications.  I have discussed the post-operative course to date, and the expected progress moving forward.  The patient understands what complications to be concerned about.  I will see the patient in routine follow up, or sooner if needed.    Activity plan: No heavy lifting. Pelvic rest.  Linda Vega R Linda Vega 10/22/2019, 10:20 AM

## 2019-10-23 ENCOUNTER — Telehealth: Payer: Self-pay

## 2019-10-23 DIAGNOSIS — G4730 Sleep apnea, unspecified: Secondary | ICD-10-CM | POA: Diagnosis not present

## 2019-10-23 NOTE — Telephone Encounter (Signed)
Spoke w/patient. She reports having more and more hot flashes since surgery. She's inquiring if this is temporary. I advised that it is normal, but can not advise on the length her symptoms may last. Notified Dr. Gilman Schmidt is OR/CALL today. Will send message for her review/advice.

## 2019-10-23 NOTE — Telephone Encounter (Signed)
Patient has a questions about hot flashes after surgery. [phone]

## 2019-10-29 ENCOUNTER — Other Ambulatory Visit: Payer: Self-pay | Admitting: Family Medicine

## 2019-11-21 ENCOUNTER — Other Ambulatory Visit: Payer: Self-pay

## 2019-11-21 ENCOUNTER — Ambulatory Visit (INDEPENDENT_AMBULATORY_CARE_PROVIDER_SITE_OTHER): Payer: 59 | Admitting: Obstetrics and Gynecology

## 2019-11-21 ENCOUNTER — Encounter: Payer: Self-pay | Admitting: Obstetrics and Gynecology

## 2019-11-21 VITALS — BP 130/90 | Ht 67.0 in | Wt 232.0 lb

## 2019-11-21 DIAGNOSIS — Z90710 Acquired absence of both cervix and uterus: Secondary | ICD-10-CM

## 2019-11-21 NOTE — Progress Notes (Signed)
  Postoperative Follow-up Patient presents post op from laparoscopic assisted vaginal hysterectomy for abnormal uterine bleeding, 6 weeks ago.  Subjective: Patient reports marked improvement in her preop symptoms. Eating a regular diet without difficulty. Pain is controlled without any medications.  Activity: normal activities of daily living. Patient reports additional symptom's since surgery of some irritation when wearing pants in her abdomen. Has been better for about 1 week.  Objective: BP 130/90   Ht 5' 7"  (1.702 m)   Wt 232 lb (105.2 kg)   BMI 36.34 kg/m  Physical Exam Constitutional:      Appearance: She is well-developed.  Genitourinary:     Vagina and uterus normal.     No lesions in the vagina.     No cervical motion tenderness.     No right or left adnexal mass present.     Genitourinary Comments: Intact vaginal cuff. Good healing. No vaginal bleeding  HENT:     Head: Normocephalic and atraumatic.  Neck:     Thyroid: No thyromegaly.  Cardiovascular:     Rate and Rhythm: Normal rate and regular rhythm.     Heart sounds: Normal heart sounds.  Pulmonary:     Effort: Pulmonary effort is normal.     Breath sounds: Normal breath sounds.  Chest:     Breasts:        Right: No inverted nipple, mass, nipple discharge or skin change.        Left: No inverted nipple, mass, nipple discharge or skin change.  Abdominal:     General: Bowel sounds are normal. There is no distension.     Palpations: Abdomen is soft. There is no mass.     Comments: Incisions healing normally.  Musculoskeletal:     Cervical back: Neck supple.  Neurological:     Mental Status: She is alert and oriented to person, place, and time.  Skin:    General: Skin is warm and dry.  Psychiatric:        Behavior: Behavior normal.        Thought Content: Thought content normal.        Judgment: Judgment normal.  Vitals reviewed.   Assessment: s/p :  total laparoscopic hysterectomy with right  salpingectomy stable  Plan: Patient has done well after surgery with no apparent complications.  I have discussed the post-operative course to date, and the expected progress moving forward.  The patient understands what complications to be concerned about.  I will see the patient in routine follow up, or sooner if needed.    Activity plan: No heavy lifting. Pelvic rest.  Dorean Daniello R Kambrey Hagger 11/21/2019, 1:55 PM

## 2019-11-27 ENCOUNTER — Other Ambulatory Visit: Payer: Self-pay | Admitting: Obstetrics and Gynecology

## 2019-11-27 DIAGNOSIS — N6310 Unspecified lump in the right breast, unspecified quadrant: Secondary | ICD-10-CM

## 2019-12-05 NOTE — Telephone Encounter (Signed)
I'm unsure of what needs to be done for this patient

## 2019-12-08 ENCOUNTER — Ambulatory Visit
Admission: RE | Admit: 2019-12-08 | Discharge: 2019-12-08 | Disposition: A | Discharge status: Home / Self Care | Payer: 59 | Source: Ambulatory Visit | Attending: Obstetrics and Gynecology | Admitting: Obstetrics and Gynecology

## 2019-12-08 DIAGNOSIS — N6310 Unspecified lump in the right breast, unspecified quadrant: Secondary | ICD-10-CM | POA: Diagnosis not present

## 2019-12-08 DIAGNOSIS — N6489 Other specified disorders of breast: Secondary | ICD-10-CM | POA: Diagnosis not present

## 2019-12-08 DIAGNOSIS — R922 Inconclusive mammogram: Secondary | ICD-10-CM | POA: Diagnosis not present

## 2019-12-30 DIAGNOSIS — Z1371 Encounter for nonprocreative screening for genetic disease carrier status: Secondary | ICD-10-CM

## 2019-12-30 DIAGNOSIS — Z803 Family history of malignant neoplasm of breast: Secondary | ICD-10-CM

## 2019-12-30 HISTORY — DX: Family history of malignant neoplasm of breast: Z80.3

## 2019-12-30 HISTORY — DX: Encounter for nonprocreative screening for genetic disease carrier status: Z13.71

## 2019-12-31 ENCOUNTER — Ambulatory Visit (INDEPENDENT_AMBULATORY_CARE_PROVIDER_SITE_OTHER): Payer: 59 | Admitting: Obstetrics and Gynecology

## 2019-12-31 ENCOUNTER — Other Ambulatory Visit: Payer: Self-pay

## 2019-12-31 ENCOUNTER — Encounter: Payer: Self-pay | Admitting: Obstetrics and Gynecology

## 2019-12-31 ENCOUNTER — Telehealth: Payer: Self-pay

## 2019-12-31 VITALS — BP 130/72 | Ht 67.0 in | Wt 237.0 lb

## 2019-12-31 DIAGNOSIS — Z90710 Acquired absence of both cervix and uterus: Secondary | ICD-10-CM

## 2019-12-31 DIAGNOSIS — Z1239 Encounter for other screening for malignant neoplasm of breast: Secondary | ICD-10-CM

## 2019-12-31 DIAGNOSIS — Z9189 Other specified personal risk factors, not elsewhere classified: Secondary | ICD-10-CM

## 2019-12-31 DIAGNOSIS — Z1231 Encounter for screening mammogram for malignant neoplasm of breast: Secondary | ICD-10-CM

## 2019-12-31 DIAGNOSIS — Z803 Family history of malignant neoplasm of breast: Secondary | ICD-10-CM | POA: Diagnosis not present

## 2019-12-31 NOTE — Telephone Encounter (Signed)
Called pt and was taken straight to voice mail, need to update cancer sites for family members on myriad test form. [place].

## 2019-12-31 NOTE — Progress Notes (Signed)
Patient ID: Linda Vega, female   DOB: 1976-01-25, 44 y.o.   MRN: [id number]  Reason for Consult: Gynecologic Exam (post op)   Referred by [name] *  Subjective:     HPI:  Linda Vega is a 44 y.o. female she reports that she recently had vaginal intercourse.  She reports that her partner felt something pointy at the top of her vagina.  She reports that she only had mild discomfort with intercourse and this was not atypical in related to position.  She denies vaginal bleeding.  She denies vaginal discharge.  She has occasional twinges in her abdomen but has largely been doing well after surgery.   Past Medical History:  Diagnosis Date  . Anxiety   . Hypertension    Family History  Problem Relation Age of Onset  . Hypertension Mother   . Breast cancer Maternal Aunt 1  . Breast cancer Maternal Grandmother 30   Past Surgical History:  Procedure Laterality Date  . BREAST BIOPSY Right 11/23/2017   US guided breast biopsy of 2:00 mass, coil marker, cellular fibroadenoma  . BREAST CYST ASPIRATION Left    neg  . CESAREAN SECTION    . CYSTOSCOPY N/A 10/14/2019   Procedure: CYSTOSCOPY;  Surgeon: Homero Fellers, MD;  Location: [facility] OR[facility];  Service: Gynecology;  Laterality: N/A;  . fibrocystic breast    . IUD REMOVAL N/A 10/14/2019   Procedure: INTRAUTERINE DEVICE (IUD) REMOVAL;  Surgeon: Homero Fellers, MD;  Location: [facility];  Service: Gynecology;  Laterality: N/A;  . LAPAROSCOPY FOR ECTOPIC PREGNANCY    . TOTAL LAPAROSCOPIC HYSTERECTOMY WITH SALPINGECTOMY Right 10/14/2019   Procedure: TOTAL LAPAROSCOPIC HYSTERECTOMY WITH RIGHT SALPINGECTOMY;  Surgeon: Homero Fellers, MD;  Location: [facility];  Service: Gynecology;  Laterality: Right;    Short Social History:  Social History   Tobacco Use  . Smoking status: Never Smoker  . Smokeless tobacco: Never Used  Substance Use Topics  . Alcohol use: Yes    Comment: occasional, about 3x a month     Allergies  Allergen Reactions  . Shellfish Allergy Hives, Hypertension, Itching, Palpitations, Shortness Of Breath, Swelling and Anaphylaxis  . Chicken Allergy Other (See Comments)    Listed per pt request.  . Eggs Or Egg-Derived Products Hives  . Lidocaine     Current Outpatient Medications  Medication Sig Dispense Refill  . amLODipine (NORVASC) 10 MG tablet TAKE 1 TABLET BY MOUTH DAILY. 90 tablet 3  . AUVI-Q 0.3 MG/0.3ML SOAJ injection Inject 0.3 mg into the muscle as needed for anaphylaxis.     . B Complex-C-Folic Acid (STRESS FORMULA PO) Take 1 tablet by mouth daily. GoodBye Stress by OLLY    . baclofen (LIORESAL) 10 MG tablet Take 0.5-1 tablets (5-10 mg total) by mouth 3 (three) times daily as needed for muscle spasms. 30 each 2  . escitalopram (LEXAPRO) 10 MG tablet Take 1 tablet (10 mg total) by mouth daily. 30 tablet 3  . fexofenadine (ALLEGRA) 180 MG tablet Take 180 mg by mouth daily as needed for allergies or rhinitis.    . fluticasone (FLONASE) 50 MCG/ACT nasal spray Place 2 sprays into both nostrils daily. Use for 4-6 weeks then stop and use seasonally or as needed. (Patient taking differently: Place 2 sprays into both nostrils daily as needed (allergies.). ) 16 g 3  . ibuprofen (ADVIL) 600 MG tablet Take 1 tablet (600 mg total) by mouth every 6 (six) hours as needed. 60 tablet 0  .  ketoconazole (NIZORAL) 2 % cream Apply 1 application topically 2 (two) times daily as needed (skin irritation/rash.).   1  . meloxicam (MOBIC) 15 MG tablet Take 15 mg by mouth daily as needed for pain.     . Multiple Vitamin (MULTI-VITAMIN) tablet Take by mouth.    Marland Kitchen omeprazole (PRILOSEC) 20 MG capsule Take 20 mg by mouth daily.    . promethazine (PHENERGAN) 25 MG tablet Take 1 tablet (25 mg total) by mouth every 6 (six) hours as needed for nausea or vomiting. 30 tablet 3  . simethicone (GAS-X) 80 MG chewable tablet Chew 1 tablet (80 mg total) by mouth every 6 (six) hours as needed for  flatulence. 30 tablet 0  . valACYclovir (VALTREX) 500 MG tablet TAKE 1 TABLET BY MOUTH 2 TIMES A DAILY FOR 3 TO 5 DAYS AS NEEDED FOR FLARE 30 tablet 4   No current facility-administered medications for this visit.    Review of Systems  Constitutional: Negative for chills, fatigue, fever and unexpected weight change.  HENT: Negative for trouble swallowing.  Eyes: Negative for loss of vision.  Respiratory: Negative for cough, shortness of breath and wheezing.  Cardiovascular: Negative for chest pain, leg swelling, palpitations and syncope.  GI: Negative for abdominal pain, blood in stool, diarrhea, nausea and vomiting.  GU: Negative for difficulty urinating, dysuria, frequency and hematuria.  Musculoskeletal: Negative for back pain, leg pain and joint pain.  Skin: Negative for rash.  Neurological: Negative for dizziness, headaches, light-headedness, numbness and seizures.  Psychiatric: Negative for behavioral problem, confusion, depressed mood and sleep disturbance.        Objective:  Objective   Vitals:   12/31/19 1431  BP: 130/72  Weight: 237 lb (107.5 kg)  Height: 5' 7"  (1.702 m)   Body mass index is 37.12 kg/m.  Physical Exam Vitals and nursing note reviewed. Exam conducted with a chaperone present.  Constitutional:      Appearance: She is well-developed.  HENT:     Head: Normocephalic and atraumatic.  Eyes:     Pupils: Pupils are equal, round, and reactive to light.  Cardiovascular:     Rate and Rhythm: Normal rate and regular rhythm.  Pulmonary:     Effort: Pulmonary effort is normal. No respiratory distress.  Genitourinary:    Comments: On speculum exam vaginal cuff is well-healed.  Small pinpoint area of suture seen at vaginal cuff.  This may have been the area that was uncomfortable during intercourse.  Possible that if a visible suture was there it is now released and only a small fragment is left behind.  Counseled the patient that this fragment will dissolve  with time and pain with intercourse which should improve.  Vaginal incision palpates intact.  No bleeding seen. Skin:    General: Skin is warm and dry.  Neurological:     Mental Status: She is alert and oriented to person, place, and time.  Psychiatric:        Behavior: Behavior normal.        Thought Content: Thought content normal.        Judgment: Judgment normal.         Assessment/Plan:     44 year old s/p laparoscopic hysterectomy bilateral salpingectomy.  Has been doing well with minimal complications after surgery. No issues with vaginal incision on exam today.  Reassurance given to patient.  Follow-up as needed.  Discussed with the patient her increased risk of breast cancer and that she qualifies for annual breast MRIs.  She completed my risk testing today and order was placed for breast MRI in the fall.  Adrian Prows MD, [facility], [facility] 12/31/2019 3:04 PM

## 2020-01-14 ENCOUNTER — Other Ambulatory Visit: Payer: Self-pay

## 2020-01-14 ENCOUNTER — Encounter: Payer: Self-pay | Admitting: Family Medicine

## 2020-01-14 ENCOUNTER — Ambulatory Visit (INDEPENDENT_AMBULATORY_CARE_PROVIDER_SITE_OTHER): Payer: 59 | Admitting: Family Medicine

## 2020-01-14 VITALS — BP 133/77 | HR 91 | Temp 97.5°F | Resp 16 | Ht 67.0 in | Wt 235.6 lb

## 2020-01-14 DIAGNOSIS — D508 Other iron deficiency anemias: Secondary | ICD-10-CM | POA: Diagnosis not present

## 2020-01-14 DIAGNOSIS — Z1211 Encounter for screening for malignant neoplasm of colon: Secondary | ICD-10-CM

## 2020-01-14 DIAGNOSIS — Z1159 Encounter for screening for other viral diseases: Secondary | ICD-10-CM | POA: Diagnosis not present

## 2020-01-14 DIAGNOSIS — E559 Vitamin D deficiency, unspecified: Secondary | ICD-10-CM

## 2020-01-14 DIAGNOSIS — G43009 Migraine without aura, not intractable, without status migrainosus: Secondary | ICD-10-CM | POA: Diagnosis not present

## 2020-01-14 DIAGNOSIS — E7800 Pure hypercholesterolemia, unspecified: Secondary | ICD-10-CM

## 2020-01-14 DIAGNOSIS — I10 Essential (primary) hypertension: Secondary | ICD-10-CM | POA: Diagnosis not present

## 2020-01-14 DIAGNOSIS — Z0000 Encounter for general adult medical examination without abnormal findings: Secondary | ICD-10-CM

## 2020-01-14 DIAGNOSIS — E669 Obesity, unspecified: Secondary | ICD-10-CM | POA: Diagnosis not present

## 2020-01-14 NOTE — Assessment & Plan Note (Signed)
Stable without active migraine On CCB Amlodipine prophylaxis and for BP

## 2020-01-14 NOTE — Patient Instructions (Addendum)
Thank you for coming to the office today.  Check with insurance on Cologuard and Hepatitis C blood antibody screening.  Orders for [facility] - can get in [place].  Keep on Olly for stress  Caution with sleeping prone or on stomach, this can increase risk of sleep apnea  Colon Cancer Screening: - For all adults age 46+ routine colon cancer screening is highly recommended.     - Recent guidelines from [place] recommend starting age of 64 - Early detection of colon cancer is important, because often there are no warning signs or symptoms, also if found early usually it can be cured. Late stage is hard to treat.  - If you are not interested in Colonoscopy screening (if done and normal you could be cleared for 5 to 10 years until next due), then Cologuard is an excellent alternative for screening test for Colon Cancer. It is highly sensitive for detecting DNA of colon cancer from even the earliest stages. Also, there is NO bowel prep required. - If Cologuard is NEGATIVE, then it is good for 3 years before next due - If Cologuard is POSITIVE, then it is strongly advised to get a Colonoscopy, which allows the GI doctor to locate the source of the cancer or polyp (even very early stage) and treat it by removing it. ------------------------- If you would like to proceed with Cologuard (stool DNA test) - FIRST, call your insurance company and tell them you want to check cost of Cologuard tell them CPT Code [phone] (it may be completely covered and you could get for no cost, OR max cost without any coverage is about $600). Also, keep in mind if you do NOT open the kit, and decide not to do the test, you will NOT be charged, you should contact the company if you decide not to do the test. - If you want to proceed, you can notify us (phone message, [place], or at next visit) and we will order it for you. The test kit will be delivered to you house within about 1 week. Follow  instructions to collect sample, you may call the company for any help or questions, 24/7 telephone support at [phone].    Please schedule a Follow-up Appointment to: Return in about 1 year (around 01/13/2021) for Annual Physical.  If you have any other questions or concerns, please feel free to call the office or send a message through [place]. You may also schedule an earlier appointment if necessary.  Additionally, you may be receiving a survey about your experience at our office within a few days to 1 week by e-mail or mail. We value your feedback.  [name], DO [place]

## 2020-01-14 NOTE — Assessment & Plan Note (Signed)
Due for lab Has improved No acute concerns

## 2020-01-14 NOTE — Progress Notes (Signed)
Subjective:    Patient ID: Linda Vega, female    DOB: 04/07/1976, 44 y.o.   MRN: [id number]  Linda Vega is a 44 y.o. female presenting on 01/14/2020 for Annual Exam   HPI   Here for Annual Physical and Lab Review.  Wellness / Lifestyle Short term goal 10 lbs 1 month Exercise - back to gym, more regular. Walking with incline and running as well, 30 min Diet - improving healthier choices  Anxiety Improved OTC Olly Stress supplement No longer on anxiety med Lexapro  CHRONIC HTN: Checking BP at home overall improved results Current Meds -Amlodipine 6m - Previously on Propranolol for migraine prophylaxis has been off of this for while now Reports good compliance, took meds today. Tolerating well, w/o complaints.  Vitamin D Deficiency  History of low MCV / Anemia Reports prior history of heavier periods, had IUD then this caused other menstrual irregularity, she has had history of lower Hgb in 11 in past. Recent lab CBC showed Hgb 12 range, still low MCV. Asymptomatic. Bleeding seems stable with periods now.  HYPERLIPIDEMIA / Obesity BMI >36 Prior mild elevated LDL. Due for lipids and fasting blood work. Not on medicine Lifestyle: - Diet:goal for low salt diet, inc water intake, inc fruits / veggies - Exercise:plans to increase regular exercise now, cardio exercise  Snoring / Possible Sleep apnea Home study done 10/2019, worse apnea if sleeping prone. She is doing better with this.  Health Maintenance:  Breast CA Screening:UTD. Last mammogram resultnegative, done3[facility]26/[place] [place] dx[place]ibrocy[place] disease and dense tissue, no other abnormal. Maternal aunts x 2, age 939-50s treated, maternal grandmother breast cancer.Currently asymptomatic. Mammogram done 12/08/19 - negative. See below  No knownpersonal or familyhistory of colon cancer, due for screening age 910to 575- [place].  Cervical CA  Screening - LastPap smeardone 10/16/16 per [facility]CP, negative for abnormal or malignant cells and negative HPV co testing - S/p Hysterectomy. Last seen WCa[facility] LLPDr SGilman Schmidtand now no longer needs pap smears due to hysterectomy.  [facility] 01/14/2020 09/12/2019 07/16/2019  Decreased Interest 0 0 0  Down, Depressed, Hopeless 0 0 0  PHQ - 2 Score 0 0 0  Altered sleeping - 0 -  Tired, decreased energy - 0 -  Change in appetite - 0 -  Feeling bad or failure about yourself  - 0 -  Trouble concentrating - 1 -  Moving slowly or fidgety/restless - 0 -  Suicidal thoughts - 0 -  PHQ-9 Score - 1 -  Difficult doing work/chores - Very difficult -   GAD 7 : Generalized Anxiety Score 01/14/2020 09/12/2019 08/03/2017  Nervous, Anxious, on Edge 0 2 3  Control/stop worrying 0 2 2  Worry too much - different things 1 1 2   Trouble relaxing 0 0 0  Restless 0 0 0  Easily annoyed or irritable 1 3 2   Afraid - awful might happen 0 1 2  Total GAD 7 Score 2 9 11   Anxiety Difficulty Not difficult at all Somewhat difficult Very difficult      Past Medical History:  Diagnosis Date  . Anxiety   . Hypertension    Past Surgical History:  Procedure Laterality Date  . BREAST BIOPSY Right 11/23/2017   [place] biopsy of 2:00 mass, coil marker, cellular fibroadenoma  . BREAST CYST ASPIRATION Left    neg  . CESAREAN SECTION    . CYSTOSCOPY N/A 10/14/2019  Procedure: CYSTOSCOPY;  Surgeon: Homero Fellers, MD;  Location: [facility] ORS;  Service: Gynecology;  Laterality: N/A;  . fibrocystic breast    . IUD REMOVAL N/A 10/14/2019   Procedure: INTRAUTERINE DEVICE (IUD) REMOVAL;  Surgeon: Homero Fellers, MD;  Location: [facility] ORS;  Service: Gynecology;  Laterality: N/A;  . LAPAROSCOPY FOR ECTOPIC PREGNANCY    . TOTAL LAPAROSCOPIC HYSTERECTOMY WITH SALPINGECTOMY Right 10/14/2019   Procedure: TOTAL LAPAROSCOPIC HYSTERECTOMY WITH RIGHT SALPINGECTOMY;  Surgeon: Homero Fellers,  MD;  Location: [facility] ORS;  Service: Gynecology;  Laterality: Right;   Social History   Socioeconomic History  . Marital status: Single    Spouse name: Not on file  . Number of children: Not on file  . Years of education: Not on file  . Highest education level: Not on file  Occupational History  . Occupation: Office Rep/Admin    CommentSoil scientist  Tobacco Use  . Smoking status: Never Smoker  . Smokeless tobacco: Never Used  Vaping Use  . Vaping Use: Never used  Substance and Sexual Activity  . Alcohol use: Yes    Comment: occasional, about 3x a month  . Drug use: No  . Sexual activity: Not Currently    Birth control/protection: None  Other Topics Concern  . Not on file  Social History Narrative  . Not on file   Social Determinants of Health   Financial Resource Strain:   . Difficulty of Paying Living Expenses:   Food Insecurity:   . Worried About Charity fundraiser in the Last Year:   . Arboriculturist in the Last Year:   Transportation Needs:   . Film/video editor (Medical):   Marland Kitchen Lack of Transportation (Non-Medical):   Physical Activity:   . Days of Exercise per Week:   . Minutes of Exercise per Session:   Stress:   . Feeling of Stress :   Social Connections:   . Frequency of Communication with Friends and Family:   . Frequency of Social Gatherings with Friends and Family:   . Attends Religious Services:   . Active Member of Clubs or Organizations:   . Attends Archivist Meetings:   Marland Kitchen Marital Status:   Intimate Partner Violence:   . Vega of Current or Ex-Partner:   . Emotionally Abused:   Marland Kitchen Physically Abused:   . Sexually Abused:    Family History  Problem Relation Age of Onset  . Hypertension Mother   . Breast cancer Maternal Aunt        25s  . Breast cancer Maternal Grandmother 20  . Breast cancer Maternal Aunt        5s   Current Outpatient Medications on File Prior to Visit  Medication Sig  . amLODipine (NORVASC) 10  MG tablet TAKE 1 TABLET BY MOUTH DAILY.  Marland Kitchen AUVI-Q 0.3 MG/0.3ML SOAJ injection Inject 0.3 mg into the muscle as needed for anaphylaxis.   . B Complex-C-Folic Acid (STRESS FORMULA PO) Take 1 tablet by mouth daily. GoodBye Stress by OLLY  . fexofenadine (ALLEGRA) 180 MG tablet Take 180 mg by mouth daily as needed for allergies or rhinitis.  . fluticasone (FLONASE) 50 MCG/ACT nasal spray Place 2 sprays into both nostrils daily. Use for 4-6 weeks then stop and use seasonally or as needed. (Patient taking differently: Place 2 sprays into both nostrils daily as needed (allergies.). )  . ibuprofen (ADVIL) 600 MG tablet Take 1 tablet (600 mg total) by mouth every  6 (six) hours as needed.  Marland Kitchen ketoconazole (NIZORAL) 2 % cream Apply 1 application topically 2 (two) times daily as needed (skin irritation/rash.).   Marland Kitchen Multiple Vitamin (MULTI-VITAMIN) tablet Take by mouth.  Marland Kitchen omeprazole (PRILOSEC) 20 MG capsule Take 20 mg by mouth daily.  . simethicone (GAS-X) 80 MG chewable tablet Chew 1 tablet (80 mg total) by mouth every 6 (six) hours as needed for flatulence.  . valACYclovir (VALTREX) 500 MG tablet TAKE 1 TABLET BY MOUTH 2 TIMES A DAILY FOR 3 TO 5 DAYS AS NEEDED FOR FLARE   No current facility-administered medications on file prior to visit.    Review of Systems  Constitutional: Negative for activity change, appetite change, chills, diaphoresis, fatigue and fever.  HENT: Negative for congestion and hearing loss.   Eyes: Negative for visual disturbance.  Respiratory: Negative for apnea, cough, chest tightness, shortness of breath and wheezing.   Cardiovascular: Negative for chest pain, palpitations and leg swelling.  Gastrointestinal: Negative for abdominal pain, anal bleeding, blood in stool, constipation, diarrhea, nausea and vomiting.  Endocrine: Negative for cold intolerance.  Genitourinary: Negative for difficulty urinating, dysuria, frequency and hematuria.  Musculoskeletal: Negative for arthralgias,  back pain and neck pain.  Skin: Negative for rash.  Allergic/Immunologic: Negative for environmental allergies.  Neurological: Negative for dizziness, weakness, light-headedness, numbness and headaches.  Hematological: Negative for adenopathy.  Psychiatric/Behavioral: Negative for behavioral problems, dysphoric mood and sleep disturbance. The patient is not nervous/anxious.    Per HPI unless specifically indicated above      Objective:    BP 133/77   Pulse 91   Temp (!) 97.5 F (36.4 C) (Temporal)   Resp 16   Ht 5' 7"  (1.702 m)   Wt 235 lb 9.6 oz (106.9 kg)   LMP 10/04/2019 (Exact Date)   SpO2 98%   BMI 36.90 kg/m   Wt Readings from Last 3 Encounters:  01/14/20 235 lb 9.6 oz (106.9 kg)  12/31/19 237 lb (107.5 kg)  11/21/19 232 lb (105.2 kg)    Physical Exam Vitals and nursing note reviewed.  Constitutional:      General: She is not in acute distress.    Appearance: She is well-developed. She is obese. She is not diaphoretic.     Comments: Well-appearing, comfortable, cooperative  HENT:     Head: Normocephalic and atraumatic.     Right Ear: Tympanic membrane and ear canal normal.     Left Ear: Tympanic membrane and ear canal normal.  Eyes:     General:        Right eye: No discharge.        Left eye: No discharge.     Conjunctiva/sclera: Conjunctivae normal.     Pupils: Pupils are equal, round, and reactive to light.  Neck:     Thyroid: No thyromegaly.  Cardiovascular:     Rate and Rhythm: Normal rate and regular rhythm.     Heart sounds: Normal heart sounds. No murmur heard.   Pulmonary:     Effort: Pulmonary effort is normal. No respiratory distress.     Breath sounds: Normal breath sounds. No wheezing or rales.  Abdominal:     General: Bowel sounds are normal. There is no distension.     Palpations: Abdomen is soft. There is no mass.     Tenderness: There is no abdominal tenderness.  Musculoskeletal:        General: No tenderness. Normal range of motion.       Cervical back: Normal  range of motion and neck supple.     Comments: Upper / Lower Extremities: - Normal muscle tone, strength bilateral upper extremities 5/5, lower extremities 5/5  Lymphadenopathy:     Cervical: No cervical adenopathy.  Skin:    General: Skin is warm and dry.     Findings: No erythema or rash.  Neurological:     Mental Status: She is alert and oriented to person, place, and time.     Comments: Distal sensation intact to light touch all extremities  Psychiatric:        Behavior: Behavior normal.     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 Annual evaluation, LEFT breast. The patient's most recent prior imaging was in April, 2019.  EXAM: DIGITAL DIAGNOSTIC BILATERAL MAMMOGRAM WITH CAD AND TOMO  ULTRASOUND RIGHT BREAST  COMPARISON:  Previous exam(s).  ACR Breast Density Category c: The breast tissue is heterogeneously dense, which may obscure small masses.  FINDINGS: Tomosynthesis and synthesized full field CC and MLO views of both breasts were obtained.  The partially obscured isodense mass in the UPPER INNER RIGHT breast at POSTERIOR depth is unchanged, associated with a coil shaped tissue marker clip. No new or suspicious findings in the RIGHT breast.  No findings suspicious for malignancy in the LEFT breast.  Mammographic images were processed with CAD.  Targeted RIGHT breast ultrasound is performed, showing the biopsy-proven fibroadenoma at the 2 o'clock position approximately 10 cm from the nipple at MIDDLE to POSTERIOR depth measuring approximately 1.4 x 0.5 x 1.2 cm (previously 1.5 x 0.5 x 1.4 cm in April, 2019). The echogenic tissue marker clip is present within  the mass.  IMPRESSION: 1. No mammographic or sonographic evidence of malignancy involving the RIGHT breast. 2. No mammographic evidence of malignancy involving the LEFT breast. 3. Stable to slight decrease in size of the biopsy-proven cellular fibroadenoma in the [place] of the RIGHT breast since April, 2019.  RECOMMENDATION: Screening mammogram in one year.(Code:SM-B-01Y)  I have discussed the findings and recommendations with the patient. If applicable, a reminder letter will be sent to the patient regarding the next appointment.  BI-RADS CATEGORY  2: Benign.   Electronically Signed   By: Evangeline Dakin M.D.   On: 12/08/2019 13:47  Results for orders placed or performed during the hospital encounter of 10/14/19  Pregnancy, urine POC  Result Value Ref Range   Preg Test, Ur NEGATIVE NEGATIVE  ABO/Rh  Result Value Ref Range   ABO/RH(D)      A POS Performed at [facility], [address], [place], [place] [address]   Surgical pathology  Result Value Ref Range   SURGICAL PATHOLOGY      SURGICAL PATHOLOGY CASE: [id number] PATIENT: [facility] Surgical Pathology Report     Specimen Submitted: A. Uterus, cervix, right fallopian tube, IUD  Clinical History: Fibroid uterus D25.9.      DIAGNOSIS: A.  UTERUS WITH CERVIX, RIGHT FALLOPIAN TUBE AND IUD; TOTAL HYSTERECTOMY WITH RIGHT SALPINGECTOMY: - FIBROUS SEROSAL ADHESIONS. - CERVIX WITH SQUAMOUS METAPLASIA AND NABOTHIAN CYSTS. - MYOMETRIUM WITH LEIOMYOMATA, LARGEST MEASURING 3.5 CM. - INACTIVE ENDOMETRIUM WITH FOCAL DECIDUALIZED STROMAL CHANGE. - FALLOPIAN TUBE WITH FIMBRIATED END. - INTRAUTERINE DEVICE, INTACT (GROSS ONLY). - NEGATIVE FOR ATYPIA AND MALIGNANCY.  GROSS DESCRIPTION: A. Labeled: Uterus, cervix, right fallopian tube, IUD Received: In formalin Weight: 246 grams Dimensions:      Fundus -8.5 x 9.3 x 6.8 cm      Cervix -5.7 x 4 x 3.3 cm Serosa: Markedly  roughened with portions missing on the anterior surface and numerous dark brown adhesions. Portions of the serosal surfac e are bulging with subserosal nodules that range from 1.3 to 3.6 cm in diameter, each of which have a tan-white whorled pattern. Cervix: Pink-red and somewhat congested Endocervix: Pale-tan, and shows several Nabothian cysts from 0.2 to 0.5 cm in diameter, containing clear mucus Endometrial cavity:      Dimensions -6.3 x 3.8 x 1 cm      Thickness -0.1 cm      Other findings -there are 2 submucosal nodules noted that range from 0.8 to 1.6 cm in diameter, which have a whorled pattern on cut surface. Myometrium:     Thickness -up to 3.7 cm     Other findings -sectioning through the myometrium reveals several additional pale-tan bulging rubbery nodules from 0.5 cm in diameter to the largest measuring 3.5 x 3.5 cm in greatest dimensions, which also have a whorled pattern. Adnexa:      Right fallopian tube           Measurements -6.8 cm in length x 0.7 cm in diameter           Other findings -a congested portion of fallopian tube with fimbria Other comments: Separately  submitted in the same container is a intrauterine device that measures 3.2 x 3.2 x 0.3 cm in greatest dimensions, which appears to be grossly intact (not submitted)  Block summary: 1-adhesions on serosa 2-3-cervix 4-subserosal nodules 5-submucosal nodules 6-9-endomyometrium 10-11-myometrial nodules 12-13-entire longitudinally sectioned fimbriated end of right fallopian tube and representative cross-sections  Final Diagnosis performed by Quay Burow, MD.   Electronically signed 10/15/2019 1:38:26PM The electronic signature indicates that the named Attending Pathologist has evaluated the specimen Technical component performed at [facility], [address], [place], [place] [address] Lab: [phone] Dir: Rush Farmer, MD, MMM  Professional component performed at [facility], [facility], [place], [place], [place] [address] Lab: [phone] Dir: Dellia Nims. Rubinas, MD       Assessment & Plan:   Problem List Items Addressed This Visit    Vitamin D deficiency    Previously mild low Will defer repeat lab No new concerns Take OTC supplement 1,000 to 2,00 daily      Obesity (BMI 35.0-39.9 without comorbidity)    Based on height / wt BMI currently >36 Goal is approx 20-40 lb wt loss initially with lifestyle diet exercise Follow-up as planned      Relevant Orders   Hemoglobin A1c   Lipid panel   Comprehensive metabolic panel   Migraine without aura and without status migrainosus, not intractable    Stable without active migraine On CCB Amlodipine prophylaxis and for BP      Relevant Orders   Comprehensive metabolic panel   Iron deficiency anemia secondary to inadequate dietary iron intake    Due for lab Has improved No acute concerns      Relevant Orders   CBC with Differential/Platelet   Essential hypertension    Well-controlled HTN - Home BP readings reviewed  No known complications     Plan:  1. Continue current BP regimen - Amlodipine 72m daily 2. Encourage improved lifestyle - low sodium diet, regular exercise 3. Continue  monitor BP outside office, bring readings to next visit, if persistently >140/90 or new symptoms notify office sooner      Relevant Orders   Hemoglobin A1c   Lipid panel   Comprehensive metabolic panel    Other Visit Diagnoses    Annual physical exam    -  Primary   Relevant Orders   Hemoglobin A1c   CBC with Differential/Platelet   Lipid panel   Comprehensive metabolic panel   TSH   Screening for colon cancer       Relevant Orders   Cologuard   Elevated LDL cholesterol level       Relevant Orders   TSH   Need for hepatitis C screening test       Relevant Orders   Hepatitis C antibody      Updated Health Maintenance information  Keep up with yearly mammogram screening, due to family  history breast cancer. Last negative 11/2019.  Due for lab draw at [place] (at [facility]) - printed orders given to patient. She will check cost of Hep C and pursue this if want  Due for routine colon cancer screening. Never had colonoscopy (not interested), no family history colon cancer. - Discussion today about recommendations for either Colonoscopy or Cologuard screening, benefits and risks of screening, interested in Cologuard, understands that if positive then recommendation is for diagnostic colonoscopy to follow-up. - Ordered Cologuard today  - Patient advised to contact insurance first to learn cost  Encouraged improvement to lifestyle with diet and exercise - Goal of weight loss  #Snoring / Possible OSA [place] - 10/2019, reviewed report Has inc risk of sleep apnea with apnea events AHI with prone position on abdomen but otherwise improved, she tries to avoid sleeping in prone position overall.   No orders of the defined types were placed in this encounter.   Follow up plan: Return in about 1 year (around 01/13/2021) for Annual Physical.  Future will order labs for [place].  [name], [facility] 01/14/2020, 1:47 PM

## 2020-01-14 NOTE — Assessment & Plan Note (Addendum)
Well-controlled HTN - Home BP readings reviewed  No known complications     Plan:  1. Continue current BP regimen - Amlodipine 62m daily 2. Encourage improved lifestyle - low sodium diet, regular exercise 3. Continue monitor BP outside office, bring readings to next visit, if persistently >140/90 or new symptoms notify office sooner

## 2020-01-14 NOTE — Assessment & Plan Note (Signed)
Previously mild low Will defer repeat lab No new concerns Take OTC supplement 1,000 to 2,00 daily

## 2020-01-14 NOTE — Assessment & Plan Note (Signed)
Based on height / wt BMI currently >36 Goal is approx 20-40 lb wt loss initially with lifestyle diet exercise Follow-up as planned

## 2020-01-15 ENCOUNTER — Other Ambulatory Visit
Admission: RE | Admit: 2020-01-15 | Discharge: 2020-01-15 | Disposition: A | Discharge status: Home / Self Care | Payer: 59 | Attending: Family Medicine | Admitting: Family Medicine

## 2020-01-15 ENCOUNTER — Other Ambulatory Visit: Payer: Self-pay

## 2020-01-15 DIAGNOSIS — G43009 Migraine without aura, not intractable, without status migrainosus: Secondary | ICD-10-CM | POA: Diagnosis not present

## 2020-01-15 DIAGNOSIS — E7800 Pure hypercholesterolemia, unspecified: Secondary | ICD-10-CM | POA: Insufficient documentation

## 2020-01-15 DIAGNOSIS — E669 Obesity, unspecified: Secondary | ICD-10-CM | POA: Insufficient documentation

## 2020-01-15 DIAGNOSIS — D508 Other iron deficiency anemias: Secondary | ICD-10-CM | POA: Diagnosis not present

## 2020-01-15 DIAGNOSIS — I10 Essential (primary) hypertension: Secondary | ICD-10-CM | POA: Insufficient documentation

## 2020-01-15 LAB — COMPREHENSIVE METABOLIC PANEL
ALT: 16 U/L (ref 0–44)
AST: 19 U/L (ref 15–41)
Albumin: 4.2 g/dL (ref 3.5–5.0)
Alkaline Phosphatase: 48 U/L (ref 38–126)
Anion gap: 8 (ref 5–15)
BUN: 9 mg/dL (ref 6–20)
CO2: 24 mmol/L (ref 22–32)
Calcium: 8.9 mg/dL (ref 8.9–10.3)
Chloride: 104 mmol/L (ref 98–111)
Creatinine, Ser: 0.86 mg/dL (ref 0.44–1.00)
GFR calc Af Amer: >60 mL/min (ref >60)
GFR calc non Af Amer: >60 mL/min (ref >60)
Glucose, Bld: 94 mg/dL (ref 70–99)
Potassium: 3.7 mmol/L (ref 3.5–5.1)
Sodium: 136 mmol/L (ref 135–145)
Total Bilirubin: 0.4 mg/dL (ref 0.3–1.2)
Total Protein: 7.4 g/dL (ref 6.5–8.1)

## 2020-01-15 LAB — CBC WITH DIFFERENTIAL/PLATELET
Abs Immature Granulocytes: 0.01 10*3/uL (ref 0.00–0.07)
Basophils Absolute: 0.1 10*3/uL (ref 0.0–0.1)
Basophils Relative: 1 %
Eosinophils Absolute: 0.1 10*3/uL (ref 0.0–0.5)
Eosinophils Relative: 2 %
HCT: 39.5 % (ref 36.0–46.0)
Hemoglobin: 12.4 g/dL (ref 12.0–15.0)
Immature Granulocytes: 0 %
Lymphocytes Relative: 39 %
Lymphs Abs: 1.7 10*3/uL (ref 0.7–4.0)
MCH: 22.8 pg — ABNORMAL LOW (ref 26.0–34.0)
MCHC: 31.4 g/dL (ref 30.0–36.0)
MCV: 72.6 fL — ABNORMAL LOW (ref 80.0–100.0)
Monocytes Absolute: 0.5 10*3/uL (ref 0.1–1.0)
Monocytes Relative: 11 %
Neutro Abs: 2 10*3/uL (ref 1.7–7.7)
Neutrophils Relative %: 47 %
Platelets: 287 10*3/uL (ref 150–400)
RBC: 5.44 MIL/uL — ABNORMAL HIGH (ref 3.87–5.11)
RDW: 16 % — ABNORMAL HIGH (ref 11.5–15.5)
WBC: 4.2 10*3/uL (ref 4.0–10.5)
nRBC: 0 % (ref 0.0–0.2)

## 2020-01-15 LAB — LIPID PANEL
Cholesterol: 160 mg/dL (ref 0–200)
HDL: 40 mg/dL — ABNORMAL LOW (ref >40)
LDL Cholesterol: 103 mg/dL — ABNORMAL HIGH (ref 0–99)
Total CHOL/HDL Ratio: 4 RATIO
Triglycerides: 85 mg/dL (ref <150)
VLDL: 17 mg/dL (ref 0–40)

## 2020-01-15 LAB — TSH: TSH: 1.248 u[IU]/mL (ref 0.350–4.500)

## 2020-01-16 ENCOUNTER — Encounter: Payer: Self-pay | Admitting: Family Medicine

## 2020-01-16 ENCOUNTER — Encounter: Payer: Self-pay | Admitting: Obstetrics and Gynecology

## 2020-01-16 LAB — HEMOGLOBIN A1C
Hgb A1c MFr Bld: 5.8 % — ABNORMAL HIGH (ref 4.8–5.6)
Mean Plasma Glucose: 120 mg/dL

## 2020-01-16 LAB — HEPATITIS C ANTIBODY: HCV Ab: NONREACTIVE

## 2020-01-20 ENCOUNTER — Other Ambulatory Visit: Payer: Self-pay | Admitting: Obstetrics and Gynecology

## 2020-01-20 DIAGNOSIS — Z1211 Encounter for screening for malignant neoplasm of colon: Secondary | ICD-10-CM | POA: Diagnosis not present

## 2020-01-20 DIAGNOSIS — Z803 Family history of malignant neoplasm of breast: Secondary | ICD-10-CM | POA: Insufficient documentation

## 2020-01-20 LAB — COLOGUARD: Cologuard: NEGATIVE

## 2020-01-20 NOTE — Progress Notes (Signed)
MyRisk testing order placed for results scanning. Done 12/31/19

## 2020-01-21 ENCOUNTER — Other Ambulatory Visit: Payer: Self-pay | Admitting: Family Medicine

## 2020-01-21 NOTE — Telephone Encounter (Signed)
Requested medication (s) are due for refill today:  Not sure  Requested medication (s) are on the active medication list:   Yes  Future visit scheduled:   No   Had annual physical last week   Last ordered: 11/21/2019 by a historical provider.  Clinic note:  Returned because prescribed by a historical provider.   Requested Prescriptions  Pending Prescriptions Disp Refills   omeprazole (PRILOSEC) 20 MG capsule [Pharmacy Med Name: OMEPRAZOLE 20 MG CPDR 20 Capsule] 30 capsule 2    Sig: TAKE 1 CAPSULE (20 MG TOTAL) BY MOUTH DAILY BEFORE BREAKFAST.      Gastroenterology: Proton Pump Inhibitors Passed - 01/21/2020  1:43 PM      Passed - Valid encounter within last 12 months    Recent Outpatient Visits           1 week ago Annual physical exam   [place], DO   4 months ago [place], DO   6 months ago Chronic right-sided low back pain with right-sided sciatica   [place], DO   1 year ago Essential hypertension   [facility] Olin Hauser, DO   1 year ago Essential hypertension   [place], Devonne Doughty, DO

## 2020-01-23 ENCOUNTER — Encounter: Payer: Self-pay | Admitting: Family Medicine

## 2020-01-23 ENCOUNTER — Ambulatory Visit (INDEPENDENT_AMBULATORY_CARE_PROVIDER_SITE_OTHER): Payer: 59 | Admitting: Family Medicine

## 2020-01-23 ENCOUNTER — Other Ambulatory Visit: Payer: Self-pay

## 2020-01-23 VITALS — BP 137/87 | HR 102 | Temp 97.9°F | Resp 16 | Ht 67.0 in | Wt 234.6 lb

## 2020-01-23 DIAGNOSIS — S46912A Strain of unspecified muscle, fascia and tendon at shoulder and upper arm level, left arm, initial encounter: Secondary | ICD-10-CM

## 2020-01-23 DIAGNOSIS — S46812A Strain of other muscles, fascia and tendons at shoulder and upper arm level, left arm, initial encounter: Secondary | ICD-10-CM | POA: Diagnosis not present

## 2020-01-23 DIAGNOSIS — M549 Dorsalgia, unspecified: Secondary | ICD-10-CM | POA: Diagnosis not present

## 2020-01-23 DIAGNOSIS — M79622 Pain in left upper arm: Secondary | ICD-10-CM

## 2020-01-23 NOTE — Patient Instructions (Addendum)
Thank you for coming to the office today.  Try OTC Voltaren gel (anti inflammatory) can take instead of ibuprofen if preferred.  Ibuprofen dosing if you like is 600-866m every 8 hours or 3 times a day with meal.  Recommend to start taking Tylenol Extra Strength 508mtabs - take 1 to 2 tabs per dose (max 100044mevery 6-8 hours for pain (take regularly, don't skip a dose for next 7 days), max 24 hour daily dose is 6 tablets or 3000m63mn the future you can repeat the same everyday Tylenol course for 1-2 weeks at a time.   Try working on the shoulder blade muscle as we talked about, use tennis ball or something firm to help it passively relax.  Scapular Release exercises.  Also Levator Scapulae muscle as well affected  Can do Range of motion stretches  Pay attention to daily routines try to change up and avoid over strain on it.  Future can do trigger point if needed   Please schedule a Follow-up Appointment to: Return in about 4 weeks (around 02/20/2020), or if symptoms worsen or fail to improve, for left arm pain / scapular muscle.  If you have any other questions or concerns, please feel free to call the office or send a message through [facility] may also schedule an earlier appointment if necessary.  Additionally, you may be receiving a survey about your experience at our office within a few days to 1 week by e-mail or mail. We value your feedback.  Linda Vega [facility] Ask your health care provider which exercises are safe for you. Do exercises exactly as told by your health care provider and adjust them as directed. It is normal to feel mild stretching, pulling, tightness, or discomfort as you do these exercises. Stop right away if you feel sudden pain or your pain gets worse. Do not begin these exercises until told by your health care provider. Strengthening exercises These exercises build strength and  endurance in your shoulder. Endurance is the ability to use your muscles for a long time, even after they get tired. Scapular depression and retraction After you have practiced this exercise, try doing it without the arm support. Then, try doing it while standing instead of sitting. 1. Sit on a stable chair. Support your arms in front of you with pillows, armrests, or a tabletop. Keep your elbows near the sides of your body. 2. Gently move your shoulder blades down (scapular depression) and back toward your spine (retraction). Relax the muscles on the tops of your shoulders and in the back of your neck. 3. Hold for __________ seconds. 4. Slowly release the tension, and relax your muscles completely before you repeat the exercise. Repeat __________ times. Complete this exercise __________ times a day. Scapular protraction, standing  1. Stand so you are facing a wall, about one arm-length away from the wall. 2. Place your hands on the wall and straighten your elbows. 3. Move your shoulder blades down, toward the middle of your spine. 4. Keep your shoulder blades down and move them forward, toward the wall (protraction). You should feel your shoulder blades sliding forward, around your rib cage. ? If you are not sure that you are doing this exercise correctly, ask your health care provider for more instructions. 5. Hold for __________ seconds. 6. Slowly return to the starting position. Let your muscles relax completely before you repeat this exercise. Repeat __________ times. Complete this  exercise __________ times a day. Scapular protraction, supine  1. Lie on your back on a firm surface (supine position). Hold a __________ weight in your left / right hand. 2. Raise your left / right arm straight into the air so your hand is directly above your shoulder joint. 3. Push the weight into the air so your shoulder blade lifts off the surface that you are lying on. The scapula will push forward  (protraction). Do not move your head, neck, or back. 4. Hold for __________ seconds. 5. Slowly return to the starting position. Let your muscles relax completely before you repeat this exercise. Repeat __________ times. Complete this exercise __________ times a day. Scapular protraction, quadruped  1. Get on your hands and knees (quadruped position). Your hands should be directly below your shoulder blades (scapulae). 2. Straighten your arms until your elbows are locked. 3. Round your back as much as you can. Think about lifting your rib cage up into your shoulder blades. The scapula will push downward or forward (protraction). Keep your neck muscles relaxed. 4. Hold for __________ seconds. 5. Slowly return to the starting position. Let your muscles relax completely before you repeat this exercise. Repeat __________ times. Complete this exercise __________ times a day. Scapular depression  1. Sit in a stable chair that has armrests. Sit upright, with your feet flat on the floor. 2. Put your hands on the armrests with your elbows bent and your fingers pointing forward. Your hands should be about even with the sides of your body. 3. Push down on the armrests to lift yourself off the chair. Straighten your elbows and lift yourself up as much as you comfortably can. ? Move your shoulder blades down and back (scapular depression). ? Do not let your shoulders move up toward your ears. ? Keep your feet on the ground. As you get stronger, your feet should support less of your body weight as you do this exercise. 4. Hold for __________ seconds. 5. Slowly lower yourself back into the chair. Repeat __________ times. Complete this exercise __________ times a day. Shoulder extension, prone  1. Lie on your abdomen on a firm surface (prone position) so your left / right arm hangs over the edge. 2. Hold a __________ weight in your left / right hand so your palm faces in toward your body. Your arm should be  straight. 3. Squeeze your shoulder blade down toward the middle of your back. 4. Slowly raise your arm behind you and toward the ceiling, up to the height of the surface that you are lying on (extension). Keep your arm straight. 5. Hold for __________ seconds. 6. Slowly return to the starting position and relax your muscles. Repeat __________ times. Complete this exercise __________ times a day. Horizontal abduction, prone 1. Lie on your abdomen on a firm surface (prone position) so your left / right arm hangs over the edge. 2. Hold a __________ weight in your hand so your palm faces toward your feet. Your arm should be straight. 3. Squeeze your shoulder blade down toward the middle of your back. 4. Raise your left/right arm up to the height of the surface that you are lying on. ? Keep your arm straight and point your thumb forward. ? At the top of the movement, your palm should face the floor. 5. Hold for __________ seconds. 6. Slowly return to the starting position and relax your muscles. Repeat __________ times. Complete this exercise __________ times a day. Scapular retraction  1. Sit  in a stable chair without armrests, or stand. 2. Secure an exercise band to a stable object in front of you so it is at shoulder height. 3. Hold one end of the exercise band in each hand. Your palms should face down. 4. Straighten your elbows and lift your arms up to shoulder height. 5. Step back, away from the secured end of the exercise band, until the band stretches. 6. Squeeze your shoulder blades together (scapular retraction) and move your elbows back behind you. Do not shrug your shoulders while you do this. ? Your elbows should stay at about chest or shoulder height. ? Keep your upper arms lifted, away from your sides. 7. Hold for __________ seconds. 8. Slowly return to the starting position. Repeat __________ times. Complete this exercise __________ times a day. Shoulder extension  1. Sit in  a stable chair without armrests, or stand. 2. Secure an exercise band to a stable object in front of you so it is at shoulder height. 3. Hold one end of the exercise band in each hand. Your palms should face each other. 4. Straighten your elbows and lift your hands up to shoulder height. 5. Step back, away from the secured end of the exercise band, until the band stretches. 6. Squeeze your shoulder blades together and pull your hands down to the sides of your thighs (extension). Stop when your hands are straight down by your sides. Do not let your hands go behind your body. 7. Hold for __________ seconds. 8. Slowly return to the starting position. Repeat __________ times. Complete this exercise __________ times a day. Scapular retraction and external rotation  1. Sit in a stable chair without armrests, or stand. 2. Secure an exercise band to a stable object in front of you so it is at shoulder height. 3. Hold one end of the exercise band in each hand. Your palms should face down. 4. Straighten your elbows and lift your hands up to shoulder height. 5. Step back, away from the secured end of the exercise band, until the band stretches. 6. Bend your elbows and raise your hands up to the height of your head. This is external rotation. ? Your palms should face out, in front of you, at the top of the movement. ? Squeeze your shoulder blades together during this movement. This is scapular retraction. 7. Hold for __________ seconds. 8. Slowly straighten your arms to return to the starting position. Repeat __________ times. Complete this exercise __________ times a day. This information is not intended to replace advice given to you by your health care provider. Make sure you discuss any questions you have with your health care provider. Document Revised: 11/08/2018 Document Reviewed: 05/23/2018 Elsevier Patient Education  2020 [employer].

## 2020-01-23 NOTE — Progress Notes (Signed)
Subjective:    Patient ID: Linda Vega, female    DOB: 1976-04-28, 44 y.o.   MRN: [id number]  Linda Vega is a 44 y.o. female presenting on 01/23/2020 for Arm Pain (onset 2 months left side)   HPI   LEFT UPPER EXTREMITY / SHOULDER PAIN Reports symptoms onset for past 2-3 months with mild to moderate 3 to 4 out of 10 severity dull aching pain within the Left upper extremity mostly shoulder into upper arm to forearm. Not involving the hand. Seems to be more constant mild pain most of the time, and can be flared up or triggered if using that arm more especially if laying on it overnight on that side, or often even standing and has left arm bent and typing. She does not do any significant lifting or overhead repetitive activities with left arm. She has not changed routine lately or new regimen that would put more strain on it. Additionally has some stiffness in Right hand index / middle fingers, worse in AM with bending or moving but does improve. She is right handed, does a lot of typing. She has history of osteoarthritis in spine and other areas. - Denies any paresthesia, numbness tingling, injury fall trauma, redness swelling, weakness   Depression screen [facility] 2/9 01/14/2020 09/12/2019 07/16/2019  Decreased Interest 0 0 0  Down, Depressed, Hopeless 0 0 0  PHQ - 2 Score 0 0 0  Altered sleeping - 0 -  Tired, decreased energy - 0 -  Change in appetite - 0 -  Feeling bad or failure about yourself  - 0 -  Trouble concentrating - 1 -  Moving slowly or fidgety/restless - 0 -  Suicidal thoughts - 0 -  PHQ-9 Score - 1 -  Difficult doing work/chores - Very difficult -    Social History   Tobacco Use  . Smoking status: Never Smoker  . Smokeless tobacco: Never Used  Vaping Use  . Vaping Use: Never used  Substance Use Topics  . Alcohol use: Yes    Comment: occasional, about 3x a month  . Drug use: No    Review of Systems Per HPI unless specifically indicated above      Objective:    BP 137/87   Pulse (!) 102   Temp 97.9 F (36.6 C) (Temporal)   Resp 16   Ht 5' 7"  (1.702 m)   Wt 234 lb 9.6 oz (106.4 kg)   LMP 10/04/2019 (Exact Date)   SpO2 99%   BMI 36.74 kg/m   Wt Readings from Last 3 Encounters:  01/23/20 234 lb 9.6 oz (106.4 kg)  01/14/20 235 lb 9.6 oz (106.9 kg)  12/31/19 237 lb (107.5 kg)    Physical Exam Vitals and nursing note reviewed.  Constitutional:      General: She is not in acute distress.    Appearance: She is well-developed. She is obese. She is not diaphoretic.     Comments: Well-appearing, comfortable, cooperative  HENT:     Head: Normocephalic and atraumatic.  Eyes:     General:        Right eye: No discharge.        Left eye: No discharge.     Conjunctiva/sclera: Conjunctivae normal.  Cardiovascular:     Rate and Rhythm: Normal rate.  Pulmonary:     Effort: Pulmonary effort is normal.  Musculoskeletal:     Comments: Left Shoulder Inspection: Normal appearance bilateral symmetrical Palpation: Mild tender to palpation posterior shoulder /  scapular area and levator scap only with some muscle spasm hypertonicity ROM: Full intact active ROM forward flexion, abduction, internal / external rotation, symmetrical Special Testing: Rotator cuff testing negative for weakness with supraspinatus full can and empty can test, Hawkin's AC impingement negative for pain Strength: Normal strength 5/5 flex/ext, ext rot / int rot, grip, rotator cuff str testing. Neurovascular: Distally intact pulses, sensation to light touch  Right grip normal. Index/middle fingers do not have any deformity, no fibrous nodularity or abnormality of tendons. Full range of motion at this time.  Skin:    General: Skin is warm and dry.     Findings: No erythema or rash.  Neurological:     Mental Status: She is alert and oriented to person, place, and time.  Psychiatric:        Behavior: Behavior normal.     Comments: Well groomed, good eye contact,  normal speech and thoughts    Results for orders placed or performed during the hospital encounter of 01/15/20  TSH  Result Value Ref Range   TSH 1.248 0.350 - 4.500 uIU/mL  Comprehensive metabolic panel  Result Value Ref Range   Sodium 136 135 - 145 mmol/L   Potassium 3.7 3.5 - 5.1 mmol/L   Chloride 104 98 - 111 mmol/L   CO2 24 22 - 32 mmol/L   Glucose, Bld 94 70 - 99 mg/dL   BUN 9 6 - 20 mg/dL   Creatinine, Ser 0.86 0.44 - 1.00 mg/dL   Calcium 8.9 8.9 - 10.3 mg/dL   Total Protein 7.4 6.5 - 8.1 g/dL   Albumin 4.2 3.5 - 5.0 g/dL   AST 19 15 - 41 U/L   ALT 16 0 - 44 U/L   Alkaline Phosphatase 48 38 - 126 U/L   Total Bilirubin 0.4 0.3 - 1.2 mg/dL   GFR calc non Af Amer >60 >60 mL/min   GFR calc Af Amer >60 >60 mL/min   Anion gap 8 5 - 15  Lipid panel  Result Value Ref Range   Cholesterol 160 0 - 200 mg/dL   Triglycerides 85 <150 mg/dL   HDL 40 (L) >40 mg/dL   Total CHOL/HDL Ratio 4.0 RATIO   VLDL 17 0 - 40 mg/dL   LDL Cholesterol 103 (H) 0 - 99 mg/dL  CBC with Differential/Platelet  Result Value Ref Range   WBC 4.2 4.0 - 10.5 K/uL   RBC 5.44 (H) 3.87 - 5.11 MIL/uL   Hemoglobin 12.4 12.0 - 15.0 g/dL   HCT 39.5 36 - 46 %   MCV 72.6 (L) 80.0 - 100.0 fL   MCH 22.8 (L) 26.0 - 34.0 pg   MCHC 31.4 30.0 - 36.0 g/dL   RDW 16.0 (H) 11.5 - 15.5 %   Platelets 287 150 - 400 K/uL   nRBC 0.0 0.0 - 0.2 %   Neutrophils Relative % 47 %   Neutro Abs 2.0 1.7 - 7.7 K/uL   Lymphocytes Relative 39 %   Lymphs Abs 1.7 0.7 - 4.0 K/uL   Monocytes Relative 11 %   Monocytes Absolute 0.5 0 - 1 K/uL   Eosinophils Relative 2 %   Eosinophils Absolute 0.1 0 - 0 K/uL   Basophils Relative 1 %   Basophils Absolute 0.1 0 - 0 K/uL   Immature Granulocytes 0 %   Abs Immature Granulocytes 0.01 0.00 - 0.07 K/uL  Hepatitis C antibody  Result Value Ref Range   HCV Ab NON REACTIVE NON REACTIVE  Hemoglobin  A1c  Result Value Ref Range   Hgb A1c MFr Bld 5.8 (H) 4.8 - 5.6 %   Mean Plasma Glucose 120  mg/dL      Assessment & Plan:   Problem List Items Addressed This Visit    None    Visit Diagnoses    Left upper arm pain    -  Primary   Muscle strain of left scapular region, initial encounter       Relevant Medications   baclofen (LIORESAL) 10 MG tablet   Strain of left levator scapulae muscle, initial encounter       Relevant Medications   baclofen (LIORESAL) 10 MG tablet   Upper back pain on left side       Relevant Medications   baclofen (LIORESAL) 10 MG tablet     Consistent with Left scapular muscle spasm and assoc levator scap muscle spasm with some radiating aching pain into Left upper extremity arm, however no abnormal identified by history or exam of Left shoulder, elbow, or wrist. No evidence of biceps tendon injury. No bursitis on exam. - Most likely repetitive overuse muscle spasm strain due to daily function / work positioning typing postural No neurological deficits or weakness or paresthesia  Plan: 1. Briefly demonstrated Left Myofascial scapular release, Myofascial levator scap release today with mild improvement. Discussed modification for home stretching and passive release, can use tennis ball or other object 2. Use existing med at home - no new rx - trial on muscle relaxant with Baclofen 5-34m TID PRN, caution sedation 3. May use NSAID ibuprofen dosing / Tylenol / may ADD Voltaren topical 4. Recommend regular use heating pad / moist heat, stretching and soft tissue massage techniques as demonstrated, avoid heavy lifting / repetitive activities 5. Follow-up within 4 weeks, may consider trigger point injection in future, may consider chiropractor if recurrent problem    No orders of the defined types were placed in this encounter.   Follow up plan: Return in about 4 weeks (around 02/20/2020), or if symptoms worsen or fail to improve, for left arm pain / scapular muscle.   ANobie Putnam [facility] 01/23/2020, 8:13 AM

## 2020-01-30 ENCOUNTER — Telehealth: Payer: Self-pay

## 2020-01-30 NOTE — Telephone Encounter (Signed)
Called pt with negative Cologuard results. Pt verbalized understanding.  KP

## 2020-02-06 ENCOUNTER — Other Ambulatory Visit: Payer: Self-pay

## 2020-02-06 ENCOUNTER — Telehealth (INDEPENDENT_AMBULATORY_CARE_PROVIDER_SITE_OTHER): Payer: Self-pay | Admitting: Gastroenterology

## 2020-02-06 DIAGNOSIS — N939 Abnormal uterine and vaginal bleeding, unspecified: Secondary | ICD-10-CM | POA: Insufficient documentation

## 2020-02-06 DIAGNOSIS — Z1211 Encounter for screening for malignant neoplasm of colon: Secondary | ICD-10-CM

## 2020-02-06 MED ORDER — NA SULFATE-K SULFATE-MG SULF 17.5-3.13-1.6 GM/177ML PO SOLN: 1.0000 | Freq: Once | ORAL | 0 refills | Status: AC

## 2020-02-06 NOTE — Progress Notes (Signed)
Gastroenterology Pre-Procedure Review  Request Date: 03/12/20 Requesting Physician: Dr. Bonna Gains  PATIENT REVIEW QUESTIONS: The patient responded to the following health history questions as indicated:    1. Are you having any GI issues? no 2. Do you have a personal history of Polyps? no 3. Do you have a family history of Colon Cancer or Polyps? no 4. Diabetes Mellitus? no 5. Joint replacements in the past 12 months?no 6. Major health problems in the past 3 months?no 7. Any artificial heart valves, MVP, or defibrillator?no    MEDICATIONS & ALLERGIES:    Patient reports the following regarding taking any anticoagulation/antiplatelet therapy:   Plavix, Coumadin, Eliquis, Xarelto, Lovenox, Pradaxa, Brilinta, or Effient? no Aspirin? no  Patient confirms/reports the following medications:  Current Outpatient Medications  Medication Sig Dispense Refill   amLODipine (NORVASC) 10 MG tablet TAKE 1 TABLET BY MOUTH DAILY. 90 tablet 3   AUVI-Q 0.3 MG/0.3ML SOAJ injection Inject 0.3 mg into the muscle as needed for anaphylaxis.      B Complex-C-Folic Acid (STRESS FORMULA PO) Take 1 tablet by mouth daily. GoodBye Stress by OLLY     baclofen (LIORESAL) 10 MG tablet Take 0.5-1 tablets (5-10 mg total) by mouth 3 (three) times daily as needed for muscle spasms. 30 each 1   fexofenadine (ALLEGRA) 180 MG tablet Take 180 mg by mouth daily as needed for allergies or rhinitis.     fluticasone (FLONASE) 50 MCG/ACT nasal spray Place 2 sprays into both nostrils daily. Use for 4-6 weeks then stop and use seasonally or as needed. (Patient taking differently: Place 2 sprays into both nostrils daily as needed (allergies.). ) 16 g 3   ibuprofen (ADVIL) 600 MG tablet Take 1 tablet (600 mg total) by mouth every 6 (six) hours as needed. 60 tablet 0   ketoconazole (NIZORAL) 2 % cream Apply 1 application topically 2 (two) times daily as needed (skin irritation/rash.).   1   Multiple Vitamin (MULTI-VITAMIN)  tablet Take by mouth.     omeprazole (PRILOSEC) 20 MG capsule TAKE 1 CAPSULE (20 MG TOTAL) BY MOUTH DAILY BEFORE BREAKFAST. 90 capsule 1   simethicone (GAS-X) 80 MG chewable tablet Chew 1 tablet (80 mg total) by mouth every 6 (six) hours as needed for flatulence. 30 tablet 0   valACYclovir (VALTREX) 500 MG tablet TAKE 1 TABLET BY MOUTH 2 TIMES A DAILY FOR 3 TO 5 DAYS AS NEEDED FOR FLARE 30 tablet 4   No current facility-administered medications for this visit.    Patient confirms/reports the following allergies:  Allergies  Allergen Reactions   Shellfish Allergy Hives, Hypertension, Itching, Palpitations, Shortness Of Breath, Swelling and Anaphylaxis   Chicken Allergy Other (See Comments)    Listed per pt request.   Eggs Or Egg-Derived Products Hives   Lidocaine     No orders of the defined types were placed in this encounter.   AUTHORIZATION INFORMATION Primary Insurance: 1D#: Group #:  Secondary Insurance: 1D#: Group #:  SCHEDULE INFORMATION: Date: 03/12/20 Time: Location:[facility]

## 2020-02-06 NOTE — Progress Notes (Signed)
Edited Colonoscopy Triage  Gastroenterology Pre-Procedure Review  Request Date: Friday 03/12/20 Requesting Physician: Dr. Bonna Gains  PATIENT REVIEW QUESTIONS: The patient responded to the following health history questions as indicated:    1. Are you having any GI issues? no 2. Do you have a personal history of Polyps? no 3. Do you have a family history of Colon Cancer or Polyps? no 4. Diabetes Mellitus? no 5. Joint replacements in the past 12 months?no 6. Major health problems in the past 3 months?Hysterectomy March 2021 7. Any artificial heart valves, MVP, or defibrillator?no    MEDICATIONS & ALLERGIES:    Patient reports the following regarding taking any anticoagulation/antiplatelet therapy:   Plavix, Coumadin, Eliquis, Xarelto, Lovenox, Pradaxa, Brilinta, or Effient? no Aspirin? no  Patient confirms/reports the following medications:  Current Outpatient Medications  Medication Sig Dispense Refill  . amLODipine (NORVASC) 10 MG tablet TAKE 1 TABLET BY MOUTH DAILY. 90 tablet 3  . AUVI-Q 0.3 MG/0.3ML SOAJ injection Inject 0.3 mg into the muscle as needed for anaphylaxis.     . B Complex-C-Folic Acid (STRESS FORMULA PO) Take 1 tablet by mouth daily. GoodBye Stress by OLLY    . baclofen (LIORESAL) 10 MG tablet Take 0.5-1 tablets (5-10 mg total) by mouth 3 (three) times daily as needed for muscle spasms. 30 each 1  . fexofenadine (ALLEGRA) 180 MG tablet Take 180 mg by mouth daily as needed for allergies or rhinitis.    . fluticasone (FLONASE) 50 MCG/ACT nasal spray Place 2 sprays into both nostrils daily. Use for 4-6 weeks then stop and use seasonally or as needed. (Patient taking differently: Place 2 sprays into both nostrils daily as needed (allergies.). ) 16 g 3  . ibuprofen (ADVIL) 600 MG tablet Take 1 tablet (600 mg total) by mouth every 6 (six) hours as needed. 60 tablet 0  . ketoconazole (NIZORAL) 2 % cream Apply 1 application topically 2 (two) times daily as needed (skin  irritation/rash.).   1  . Multiple Vitamin (MULTI-VITAMIN) tablet Take by mouth.    . Na Sulfate-K Sulfate-Mg Sulf 17.5-3.13-1.6 GM/177ML SOLN Take 1 kit by mouth once for 1 dose. 354 mL 0  . omeprazole (PRILOSEC) 20 MG capsule TAKE 1 CAPSULE (20 MG TOTAL) BY MOUTH DAILY BEFORE BREAKFAST. 90 capsule 1  . simethicone (GAS-X) 80 MG chewable tablet Chew 1 tablet (80 mg total) by mouth every 6 (six) hours as needed for flatulence. 30 tablet 0  . valACYclovir (VALTREX) 500 MG tablet TAKE 1 TABLET BY MOUTH 2 TIMES A DAILY FOR 3 TO 5 DAYS AS NEEDED FOR FLARE 30 tablet 4   No current facility-administered medications for this visit.    Patient confirms/reports the following allergies:  Allergies  Allergen Reactions  . Shellfish Allergy Hives, Hypertension, Itching, Palpitations, Shortness Of Breath, Swelling and Anaphylaxis  . Chicken Allergy Other (See Comments)    Listed per pt request.  . Eggs Or Egg-Derived Products Hives  . Lidocaine     No orders of the defined types were placed in this encounter.   AUTHORIZATION INFORMATION Primary Insurance: 1D#: Group #:  Secondary Insurance: 1D#: Group #:  SCHEDULE INFORMATION: Date: 03/12/20 Time: Location:[facility]

## 2020-02-26 ENCOUNTER — Telehealth: Payer: Self-pay

## 2020-02-26 NOTE — Telephone Encounter (Signed)
Patient called and cancelled her procedure for 03/12/2020 with Dr. Bonna Gains due to the cost and not staying with another bill. I then called Endoscopy unit and spoke with Cecille Rubin to let her know that patient wanted to cancel her procedure. She stated that she would take care of it.

## 2020-02-27 ENCOUNTER — Ambulatory Visit: Payer: 59

## 2020-02-27 ENCOUNTER — Ambulatory Visit: Payer: 59 | Attending: Internal Medicine

## 2020-02-27 DIAGNOSIS — Z23 Encounter for immunization: Secondary | ICD-10-CM

## 2020-02-27 NOTE — Progress Notes (Signed)
   Covid-19 Vaccination Clinic  Name:  Linda Vega    MRN: [id number] DOB: [birth year]  02/27/2020  [name] was observed post Covid-19 immunization for 15 minutes without incident. She was provided with Vaccine Information Sheet and instruction to access the V-Safe system.   [name] was instructed to call 911 with any severe reactions post vaccine: Marland Kitchen Difficulty breathing  . Swelling of face and throat  . A fast heartbeat  . A bad rash all over body  . Dizziness and weakness   Immunizations Administered    Name Date Dose VIS Date Route   Pfizer COVID-19 Vaccine 02/27/2020  8:16 AM 0.3 mL 09/24/2018 Intramuscular   Manufacturer: [place]   Lot: [id number]   [place]: [address]-3

## 2020-03-12 ENCOUNTER — Ambulatory Visit: Admit: 2020-03-12 | Payer: 59 | Admitting: Gastroenterology

## 2020-03-12 SURGERY — COLONOSCOPY WITH PROPOFOL
Anesthesia: General

## 2020-04-29 ENCOUNTER — Other Ambulatory Visit: Payer: Self-pay | Admitting: Family Medicine

## 2020-04-29 DIAGNOSIS — N946 Dysmenorrhea, unspecified: Secondary | ICD-10-CM

## 2020-04-30 ENCOUNTER — Other Ambulatory Visit: Payer: Self-pay | Admitting: Family Medicine

## 2020-04-30 ENCOUNTER — Ambulatory Visit: Payer: 59 | Admitting: Family Medicine

## 2020-04-30 DIAGNOSIS — M79622 Pain in left upper arm: Secondary | ICD-10-CM

## 2020-04-30 DIAGNOSIS — N946 Dysmenorrhea, unspecified: Secondary | ICD-10-CM

## 2020-04-30 DIAGNOSIS — S46912A Strain of unspecified muscle, fascia and tendon at shoulder and upper arm level, left arm, initial encounter: Secondary | ICD-10-CM

## 2020-04-30 MED ORDER — IBUPROFEN 600 MG PO TABS: 600.0000 mg | ORAL_TABLET | Freq: Four times a day (QID) | ORAL | 1 refills | Status: DC | PRN

## 2020-06-09 ENCOUNTER — Ambulatory Visit
Admission: RE | Admit: 2020-06-09 | Discharge: 2020-06-09 | Disposition: A | Discharge status: Home / Self Care | Payer: 59 | Source: Ambulatory Visit | Attending: Obstetrics and Gynecology | Admitting: Obstetrics and Gynecology

## 2020-06-09 ENCOUNTER — Other Ambulatory Visit: Payer: Self-pay

## 2020-06-09 DIAGNOSIS — Z1231 Encounter for screening mammogram for malignant neoplasm of breast: Secondary | ICD-10-CM | POA: Diagnosis not present

## 2020-06-09 DIAGNOSIS — Z1239 Encounter for other screening for malignant neoplasm of breast: Secondary | ICD-10-CM | POA: Diagnosis not present

## 2020-06-09 DIAGNOSIS — D241 Benign neoplasm of right breast: Secondary | ICD-10-CM | POA: Insufficient documentation

## 2020-06-09 DIAGNOSIS — Z9189 Other specified personal risk factors, not elsewhere classified: Secondary | ICD-10-CM | POA: Diagnosis not present

## 2020-06-09 DIAGNOSIS — N6320 Unspecified lump in the left breast, unspecified quadrant: Secondary | ICD-10-CM | POA: Insufficient documentation

## 2020-06-09 DIAGNOSIS — N6021 Fibroadenosis of right breast: Secondary | ICD-10-CM | POA: Diagnosis not present

## 2020-06-09 MED ORDER — GADOBUTROL 1 MMOL/ML IV SOLN
10.0000 mL | Freq: Once | INTRAVENOUS | Status: AC | PRN
  Administered 2020-06-09: 10 mL via INTRAVENOUS

## 2020-06-10 ENCOUNTER — Other Ambulatory Visit: Payer: Self-pay | Admitting: Obstetrics and Gynecology

## 2020-06-10 DIAGNOSIS — N6320 Unspecified lump in the left breast, unspecified quadrant: Secondary | ICD-10-CM

## 2020-06-10 DIAGNOSIS — N6459 Other signs and symptoms in breast: Secondary | ICD-10-CM

## 2020-06-21 ENCOUNTER — Ambulatory Visit
Admission: RE | Admit: 2020-06-21 | Discharge: 2020-06-21 | Disposition: A | Discharge status: Home / Self Care | Payer: 59 | Source: Ambulatory Visit | Attending: Obstetrics and Gynecology | Admitting: Obstetrics and Gynecology

## 2020-06-21 ENCOUNTER — Other Ambulatory Visit: Payer: Self-pay

## 2020-06-21 ENCOUNTER — Other Ambulatory Visit: Payer: Self-pay | Admitting: Obstetrics and Gynecology

## 2020-06-21 DIAGNOSIS — N6459 Other signs and symptoms in breast: Secondary | ICD-10-CM | POA: Insufficient documentation

## 2020-06-21 DIAGNOSIS — N6323 Unspecified lump in the left breast, lower outer quadrant: Secondary | ICD-10-CM | POA: Diagnosis not present

## 2020-06-21 DIAGNOSIS — R928 Other abnormal and inconclusive findings on diagnostic imaging of breast: Secondary | ICD-10-CM

## 2020-06-21 DIAGNOSIS — N6320 Unspecified lump in the left breast, unspecified quadrant: Secondary | ICD-10-CM

## 2020-07-01 ENCOUNTER — Ambulatory Visit: Payer: 59 | Admitting: Family Medicine

## 2020-07-05 ENCOUNTER — Ambulatory Visit
Admission: RE | Admit: 2020-07-05 | Discharge: 2020-07-05 | Disposition: A | Discharge status: Home / Self Care | Payer: 59 | Source: Ambulatory Visit | Attending: Obstetrics and Gynecology | Admitting: Obstetrics and Gynecology

## 2020-07-05 ENCOUNTER — Other Ambulatory Visit: Payer: Self-pay

## 2020-07-05 DIAGNOSIS — N6092 Unspecified benign mammary dysplasia of left breast: Secondary | ICD-10-CM | POA: Diagnosis not present

## 2020-07-05 DIAGNOSIS — N6321 Unspecified lump in the left breast, upper outer quadrant: Secondary | ICD-10-CM | POA: Diagnosis not present

## 2020-07-05 DIAGNOSIS — D242 Benign neoplasm of left breast: Secondary | ICD-10-CM | POA: Diagnosis not present

## 2020-07-05 DIAGNOSIS — N6320 Unspecified lump in the left breast, unspecified quadrant: Secondary | ICD-10-CM | POA: Diagnosis not present

## 2020-07-05 DIAGNOSIS — R928 Other abnormal and inconclusive findings on diagnostic imaging of breast: Secondary | ICD-10-CM

## 2020-07-05 HISTORY — PX: BREAST BIOPSY: SHX20

## 2020-07-06 LAB — SURGICAL PATHOLOGY

## 2020-07-07 ENCOUNTER — Telehealth: Payer: Self-pay

## 2020-07-07 NOTE — Telephone Encounter (Signed)
Patient has an appt with Dr. Hampton Abbot on December 15 at 4:30.  Patient is aware of time/date.

## 2020-07-13 ENCOUNTER — Ambulatory Visit: Payer: 59 | Admitting: Family Medicine

## 2020-07-14 ENCOUNTER — Ambulatory Visit (INDEPENDENT_AMBULATORY_CARE_PROVIDER_SITE_OTHER): Payer: 59 | Admitting: Surgery

## 2020-07-14 ENCOUNTER — Encounter: Payer: Self-pay | Admitting: Surgery

## 2020-07-14 ENCOUNTER — Other Ambulatory Visit: Payer: Self-pay

## 2020-07-14 VITALS — BP 145/92 | HR 97 | Temp 98.4°F | Ht 66.0 in | Wt 240.6 lb

## 2020-07-14 DIAGNOSIS — D242 Benign neoplasm of left breast: Secondary | ICD-10-CM | POA: Diagnosis not present

## 2020-07-14 DIAGNOSIS — Z803 Family history of malignant neoplasm of breast: Secondary | ICD-10-CM | POA: Diagnosis not present

## 2020-07-14 DIAGNOSIS — N6092 Unspecified benign mammary dysplasia of left breast: Secondary | ICD-10-CM | POA: Diagnosis not present

## 2020-07-14 NOTE — H&P (View-Only) (Signed)
07/14/2020  Reason for Visit:  Left breast fibroadenomas  Referring Provider:  [place]  History of Present Illness: Linda Vega is a 44 y.o. female presenting for evaluation of two fibroadenomas found on left breast biopsy.  She has a history of prior fibroadenoma of the right breast, and had a mammogram on 12/08/19 and bilateral breast MRI 06/09/20.  This latter imaging found multiple enhancing masses in the left breast which was followed by a left breast ultrasound which showed an indeterminate left breast mass at the 3 o'clock position 5 cm from the nipple and a complex mass in the left breast at 5 o'clock position 8 cm from the nipple.  These were biopsied on 07/05/20.  Pathology results showed that the mass at 3:00 was a fibroadenoma with focal atypical lobular hyperplasia but without any in situ or invasive carcinoma.  The mass at 5:00 showed a complex fibroadenoma with cystic change.  Surgical consultation was recommended.  She reports that she has been having discomfort in both breasts through the years with no worsening since the biopsy.  She does have some bruising in the biopsy areas of the left breast but otherwise denies any worsening changes.  She does have a strong family history of breast cancer with her maternal grandmother and 2 maternal aunts having breast cancer.  She did get tested for BRCA and was negative.  Past Medical History: Past Medical History:  Diagnosis Date  . Anxiety   . BRCA negative 12/2019   MyRisk neg except AXIN2 VUS  . Family history of breast cancer 12/2019   IBIS=16.7%  . Hypertension      Past Surgical History: Past Surgical History:  Procedure Laterality Date  . ABDOMINAL HYSTERECTOMY    . BREAST BIOPSY Right 11/23/2017   US guided breast biopsy of 2:00 mass, coil marker, cellular fibroadenoma  . BREAST BIOPSY Left 07/05/2020   [place] Bx, 3:00, Heart Clip  . BREAST BIOPSY Left 07/05/2020   [place] Bx, 5:00, X Clip  . BREAST CYST  ASPIRATION Left    neg  . CESAREAN SECTION    . CYSTOSCOPY N/A 10/14/2019   Procedure: CYSTOSCOPY;  Surgeon: Homero Fellers, MD;  Location: [facility];  Service: Gynecology;  Laterality: N/A;  . fibrocystic breast    . IUD REMOVAL N/A 10/14/2019   Procedure: INTRAUTERINE DEVICE (IUD) REMOVAL;  Surgeon: Homero Fellers, MD;  Location: [facility];  Service: Gynecology;  Laterality: N/A;  . LAPAROSCOPY FOR ECTOPIC PREGNANCY    . TOTAL LAPAROSCOPIC HYSTERECTOMY WITH SALPINGECTOMY Right 10/14/2019   Procedure: TOTAL LAPAROSCOPIC HYSTERECTOMY WITH RIGHT SALPINGECTOMY;  Surgeon: Homero Fellers, MD;  Location: [facility];  Service: Gynecology;  Laterality: Right;    Home Medications: Prior to Admission medications   Medication Sig Start Date End Date Taking? Authorizing Provider  amLODipine (NORVASC) 10 MG tablet TAKE 1 TABLET BY MOUTH DAILY. 10/29/19  Yes Karamalegos, Devonne Doughty, DO  AUVI-Q 0.3 MG/0.3ML SOAJ injection Inject 0.3 mg into the muscle as needed for anaphylaxis.  07/29/19  Yes [provider]  B Complex-C-Folic Acid (STRESS FORMULA PO) Take 1 tablet by mouth daily. GoodBye Stress by OLLY   Yes [provider]  baclofen (LIORESAL) 10 MG tablet Take 0.5-1 tablets (5-10 mg total) by mouth 3 (three) times daily as needed for muscle spasms. 01/23/20  Yes Karamalegos, Devonne Doughty, DO  ibuprofen (ADVIL) 600 MG tablet Take 1 tablet (600 mg total) by mouth every 6 (six) hours as needed. 04/30/20  Yes Karamalegos, Devonne Doughty, DO  ketoconazole (NIZORAL) 2 % cream Apply 1 application topically 2 (two) times daily as needed (skin irritation/rash.).  12/14/17  Yes [provider]  Multiple Vitamin (MULTI-VITAMIN) tablet Take by mouth.   Yes [provider]  valACYclovir (VALTREX) 500 MG tablet TAKE 1 TABLET BY MOUTH 2 TIMES A DAILY FOR 3 TO 5 DAYS AS NEEDED FOR FLARE 09/30/19  Yes Karamalegos, Devonne Doughty, DO  fexofenadine (ALLEGRA) 180 MG tablet Take 180 mg  by mouth daily as needed for allergies or rhinitis. Patient not taking: Reported on 07/14/2020    [provider]  fluticasone (FLONASE) 50 MCG/ACT nasal spray Place 2 sprays into both nostrils daily. Use for 4-6 weeks then stop and use seasonally or as needed. Patient not taking: Reported on 07/14/2020 09/12/19   Olin Hauser, DO  omeprazole (PRILOSEC) 20 MG capsule TAKE 1 CAPSULE (20 MG TOTAL) BY MOUTH DAILY BEFORE BREAKFAST. Patient not taking: Reported on 07/14/2020 01/22/20   Olin Hauser, DO  simethicone (GAS-X) 80 MG chewable tablet Chew 1 tablet (80 mg total) by mouth every 6 (six) hours as needed for flatulence. Patient not taking: Reported on 07/14/2020 10/15/19   Homero Fellers, MD    Allergies: Allergies  Allergen Reactions  . Shellfish Allergy Hives, Hypertension, Itching, Palpitations, Shortness Of Breath, Swelling and Anaphylaxis  . Chicken Allergy Other (See Comments)    Listed per pt request.  . Eggs Or Egg-Derived Products Hives  . Lidocaine     Social History:  reports that she has never smoked. She has never used smokeless tobacco. She reports current alcohol use. She reports that she does not use drugs.   Family History: Family History  Problem Relation Age of Onset  . Hypertension Mother   . Breast cancer Maternal Aunt        43s  . Breast cancer Maternal Grandmother 20  . Breast cancer Maternal Aunt        71s    Review of Systems: Review of Systems  Constitutional: Negative for chills and fever.  HENT: Negative for hearing loss.   Respiratory: Negative for shortness of breath.   Cardiovascular: Negative for chest pain.  Gastrointestinal: Negative for abdominal pain, nausea and vomiting.  Genitourinary: Negative for dysuria.  Musculoskeletal: Negative for myalgias.  Skin: Negative for rash.  Neurological: Negative for dizziness.  Psychiatric/Behavioral: Negative for depression.    Physical Exam BP (!) 145/92    Pulse 97   Temp 98.4 F (36.9 C) (Oral)   Ht _0  (1.676 m)   Wt 240 lb 9.6 oz (109.1 kg)   LMP 10/04/2019 (Exact Date)   SpO2 98%   BMI 38.83 kg/m  CONSTITUTIONAL: No acute distress HEENT:  Normocephalic, atraumatic, extraocular motion intact. NECK: Trachea is midline, and there is no jugular venous distension.  RESPIRATORY:  Normal respiratory effort without pathologic use of accessory muscles. CARDIOVASCULAR: Regular rhythm and rate. BREAST: Left breast status post 2 biopsies with surrounding ecchymosis in the lateral portion of the left breast.  There are some palpable masses consistent with fibroadenomas as seen on her MRI and ultrasound.  No other skin changes and no nipple changes.  No left axillary lymphadenopathy.  Right breast status post excisional biopsy in the upper inner quadrant with well-healed scar.  No palpable masses except fibrocystic changes, no skin changes, no nipple changes.  No right axillary lymphadenopathy. MUSCULOSKELETAL:  Normal muscle strength and tone in all four extremities.  No peripheral edema  or cyanosis. SKIN: Skin turgor is normal. There are no pathologic skin lesions.  NEUROLOGIC:  Motor and sensation is grossly normal.  Cranial nerves are grossly intact. PSYCH:  Alert and oriented to person, place and time. Affect is normal.  Laboratory Analysis: Pathology results 07/05/20: DIAGNOSIS:  A. BREAST, LEFT, 3 O'CLOCK 5 CM FROM NIPPLE (HEART-SHAPED CLIP); ULTRASOUND-GUIDED CORE BIOPSY:  - FIBROADENOMA.  - FOCAL ATYPICAL LOBULAR HYPERPLASIA ([place]) IS PRESENT WITHIN THE FIBROADENOMA.  - NO IN SITU OR INVASIVE CARCINOMA IS IDENTIFIED IN THIS SAMPLE.   B. BREAST, LEFT, 5 O'CLOCK 8 CM FROM NIPPLE (X-SHAPED CLIP); ULTRASOUND-GUIDED CORE BIOPSY:  - COMPLEX FIBROADENOMA WITH CYSTIC CHANGE.  - NEGATIVE FOR ATYPIA AND MALIGNANCY IN THIS SAMPLE.   Imaging: MRI bilateral breast 06/09/20: IMPRESSION: 1. Biopsy-proven fibroadenoma in the RIGHT breast. 2. There  are 5 enhancing masses in the LEFT breast warranting tissue diagnosis and/or close follow-up. Recommend targeted ultrasound to determine possible sonographic correlate for these masses. If sonographic correlates are identified, biopsies are recommended with ultrasound guidance. Depending on the sonographic appearance, close follow-up may also be an option. 3. If any of the lesions are not seen with ultrasound, recommend follow-up MR guided core biopsy.  U/S left breast 06/21/20: IMPRESSION: 1. Indeterminate left breast mass at 3 o'clock 5 cm from the nipple measuring 1.2 cm with several smaller adjacent masses, possibly ductal. 2. Indeterminate complex mass in the left breast at 5 o'clock measuring 1.3 cm. 3.  Multiple additional probably benign left breast masses as above.  Assessment and Plan: This is a 44 y.o. female with a left breast fibroadenoma with focal ALH and a complex fibroadenoma.  -Discussed with the patient the significance of these findings.  In theory there is a small risk with a fibroadenoma containing a focal area of [place] that this could be upgraded to DCIS or invasive but it is very low.  Also this is a focal finding on the may have been excised with the biopsy itself.  The complex fibroadenoma is more controversial and there are studies that show increased risk and studies that did not show any increased risk for breast cancer.  However, the patient does have a strong family history with a maternal grandmother and 2 maternal aunts having had breast cancer.  I think because of this, I am leaning more towards recommending excision of these 2 areas.  I discussed with her the pros and cons of conservative measures versus excision.  Discussed with her that if she does proceed with surgery, we would do this via lumpectomy of 2 radiofrequency tagged areas as an outpatient procedure.  Reviewed with her the risks of bleeding, infection, and injury to surrounding structures.  At this  point, the patient is unsure what she wants to do and will think about it at home.  She will contact us with her decision.  Discussed with her that there is no true urgency or emergency in doing this procedure given the fact right now we do not have a diagnosis of cancer.  This can wait until after the holidays. -She will contact us with her decision.  If no surgery is gone, then she would need close MRI and/or ultrasound follow-up of these masses.  Face-to-face time spent with the patient and care providers was 60 minutes, with more than 50% of the time spent counseling, educating, and coordinating care of the patient.     Melvyn Neth, [facility]

## 2020-07-14 NOTE — Progress Notes (Signed)
07/14/2020  Reason for Visit:  Left breast fibroadenomas  Referring Provider:  [place]  History of Present Illness: Linda Vega is a 44 y.o. female presenting for evaluation of two fibroadenomas found on left breast biopsy.  She has a history of prior fibroadenoma of the right breast, and had a mammogram on 12/08/19 and bilateral breast MRI 06/09/20.  This latter imaging found multiple enhancing masses in the left breast which was followed by a left breast ultrasound which showed an indeterminate left breast mass at the 3 o'clock position 5 cm from the nipple and a complex mass in the left breast at 5 o'clock position 8 cm from the nipple.  These were biopsied on 07/05/20.  Pathology results showed that the mass at 3:00 was a fibroadenoma with focal atypical lobular hyperplasia but without any in situ or invasive carcinoma.  The mass at 5:00 showed a complex fibroadenoma with cystic change.  Surgical consultation was recommended.  She reports that she has been having discomfort in both breasts through the years with no worsening since the biopsy.  She does have some bruising in the biopsy areas of the left breast but otherwise denies any worsening changes.  She does have a strong family history of breast cancer with her maternal grandmother and 2 maternal aunts having breast cancer.  She did get tested for BRCA and was negative.  Past Medical History: Past Medical History:  Diagnosis Date  . Anxiety   . BRCA negative 12/2019   MyRisk neg except AXIN2 VUS  . Family history of breast cancer 12/2019   IBIS=16.7%  . Hypertension      Past Surgical History: Past Surgical History:  Procedure Laterality Date  . ABDOMINAL HYSTERECTOMY    . BREAST BIOPSY Right 11/23/2017   US guided breast biopsy of 2:00 mass, coil marker, cellular fibroadenoma  . BREAST BIOPSY Left 07/05/2020   [place] Bx, 3:00, Heart Clip  . BREAST BIOPSY Left 07/05/2020   [place] Bx, 5:00, X Clip  . BREAST CYST  ASPIRATION Left    neg  . CESAREAN SECTION    . CYSTOSCOPY N/A 10/14/2019   Procedure: CYSTOSCOPY;  Surgeon: Homero Fellers, MD;  Location: [facility];  Service: Gynecology;  Laterality: N/A;  . fibrocystic breast    . IUD REMOVAL N/A 10/14/2019   Procedure: INTRAUTERINE DEVICE (IUD) REMOVAL;  Surgeon: Homero Fellers, MD;  Location: [facility];  Service: Gynecology;  Laterality: N/A;  . LAPAROSCOPY FOR ECTOPIC PREGNANCY    . TOTAL LAPAROSCOPIC HYSTERECTOMY WITH SALPINGECTOMY Right 10/14/2019   Procedure: TOTAL LAPAROSCOPIC HYSTERECTOMY WITH RIGHT SALPINGECTOMY;  Surgeon: Homero Fellers, MD;  Location: [facility];  Service: Gynecology;  Laterality: Right;    Home Medications: Prior to Admission medications   Medication Sig Start Date End Date Taking? Authorizing Provider  amLODipine (NORVASC) 10 MG tablet TAKE 1 TABLET BY MOUTH DAILY. 10/29/19  Yes Karamalegos, Devonne Doughty, DO  AUVI-Q 0.3 MG/0.3ML SOAJ injection Inject 0.3 mg into the muscle as needed for anaphylaxis.  07/29/19  Yes [provider]  B Complex-C-Folic Acid (STRESS FORMULA PO) Take 1 tablet by mouth daily. GoodBye Stress by OLLY   Yes [provider]  baclofen (LIORESAL) 10 MG tablet Take 0.5-1 tablets (5-10 mg total) by mouth 3 (three) times daily as needed for muscle spasms. 01/23/20  Yes Karamalegos, Devonne Doughty, DO  ibuprofen (ADVIL) 600 MG tablet Take 1 tablet (600 mg total) by mouth every 6 (six) hours as needed. 04/30/20  Yes Karamalegos, Devonne Doughty, DO  ketoconazole (NIZORAL) 2 % cream Apply 1 application topically 2 (two) times daily as needed (skin irritation/rash.).  12/14/17  Yes [provider]  Multiple Vitamin (MULTI-VITAMIN) tablet Take by mouth.   Yes [provider]  valACYclovir (VALTREX) 500 MG tablet TAKE 1 TABLET BY MOUTH 2 TIMES A DAILY FOR 3 TO 5 DAYS AS NEEDED FOR FLARE 09/30/19  Yes Karamalegos, Devonne Doughty, DO  fexofenadine (ALLEGRA) 180 MG tablet Take 180 mg  by mouth daily as needed for allergies or rhinitis. Patient not taking: Reported on 07/14/2020    [provider]  fluticasone (FLONASE) 50 MCG/ACT nasal spray Place 2 sprays into both nostrils daily. Use for 4-6 weeks then stop and use seasonally or as needed. Patient not taking: Reported on 07/14/2020 09/12/19   Olin Hauser, DO  omeprazole (PRILOSEC) 20 MG capsule TAKE 1 CAPSULE (20 MG TOTAL) BY MOUTH DAILY BEFORE BREAKFAST. Patient not taking: Reported on 07/14/2020 01/22/20   Olin Hauser, DO  simethicone (GAS-X) 80 MG chewable tablet Chew 1 tablet (80 mg total) by mouth every 6 (six) hours as needed for flatulence. Patient not taking: Reported on 07/14/2020 10/15/19   Homero Fellers, MD    Allergies: Allergies  Allergen Reactions  . Shellfish Allergy Hives, Hypertension, Itching, Palpitations, Shortness Of Breath, Swelling and Anaphylaxis  . Chicken Allergy Other (See Comments)    Listed per pt request.  . Eggs Or Egg-Derived Products Hives  . Lidocaine     Social History:  reports that she has never smoked. She has never used smokeless tobacco. She reports current alcohol use. She reports that she does not use drugs.   Family History: Family History  Problem Relation Age of Onset  . Hypertension Mother   . Breast cancer Maternal Aunt        43s  . Breast cancer Maternal Grandmother 20  . Breast cancer Maternal Aunt        71s    Review of Systems: Review of Systems  Constitutional: Negative for chills and fever.  HENT: Negative for hearing loss.   Respiratory: Negative for shortness of breath.   Cardiovascular: Negative for chest pain.  Gastrointestinal: Negative for abdominal pain, nausea and vomiting.  Genitourinary: Negative for dysuria.  Musculoskeletal: Negative for myalgias.  Skin: Negative for rash.  Neurological: Negative for dizziness.  Psychiatric/Behavioral: Negative for depression.    Physical Exam BP (!) 145/92    Pulse 97   Temp 98.4 F (36.9 C) (Oral)   Ht _0  (1.676 m)   Wt 240 lb 9.6 oz (109.1 kg)   LMP 10/04/2019 (Exact Date)   SpO2 98%   BMI 38.83 kg/m  CONSTITUTIONAL: No acute distress HEENT:  Normocephalic, atraumatic, extraocular motion intact. NECK: Trachea is midline, and there is no jugular venous distension.  RESPIRATORY:  Normal respiratory effort without pathologic use of accessory muscles. CARDIOVASCULAR: Regular rhythm and rate. BREAST: Left breast status post 2 biopsies with surrounding ecchymosis in the lateral portion of the left breast.  There are some palpable masses consistent with fibroadenomas as seen on her MRI and ultrasound.  No other skin changes and no nipple changes.  No left axillary lymphadenopathy.  Right breast status post excisional biopsy in the upper inner quadrant with well-healed scar.  No palpable masses except fibrocystic changes, no skin changes, no nipple changes.  No right axillary lymphadenopathy. MUSCULOSKELETAL:  Normal muscle strength and tone in all four extremities.  No peripheral edema  or cyanosis. SKIN: Skin turgor is normal. There are no pathologic skin lesions.  NEUROLOGIC:  Motor and sensation is grossly normal.  Cranial nerves are grossly intact. PSYCH:  Alert and oriented to person, place and time. Affect is normal.  Laboratory Analysis: Pathology results 07/05/20: DIAGNOSIS:  A. BREAST, LEFT, 3 O'CLOCK 5 CM FROM NIPPLE (HEART-SHAPED CLIP); ULTRASOUND-GUIDED CORE BIOPSY:  - FIBROADENOMA.  - FOCAL ATYPICAL LOBULAR HYPERPLASIA ([place]) IS PRESENT WITHIN THE FIBROADENOMA.  - NO IN SITU OR INVASIVE CARCINOMA IS IDENTIFIED IN THIS SAMPLE.   B. BREAST, LEFT, 5 O'CLOCK 8 CM FROM NIPPLE (X-SHAPED CLIP); ULTRASOUND-GUIDED CORE BIOPSY:  - COMPLEX FIBROADENOMA WITH CYSTIC CHANGE.  - NEGATIVE FOR ATYPIA AND MALIGNANCY IN THIS SAMPLE.   Imaging: MRI bilateral breast 06/09/20: IMPRESSION: 1. Biopsy-proven fibroadenoma in the RIGHT breast. 2. There  are 5 enhancing masses in the LEFT breast warranting tissue diagnosis and/or close follow-up. Recommend targeted ultrasound to determine possible sonographic correlate for these masses. If sonographic correlates are identified, biopsies are recommended with ultrasound guidance. Depending on the sonographic appearance, close follow-up may also be an option. 3. If any of the lesions are not seen with ultrasound, recommend follow-up MR guided core biopsy.  U/S left breast 06/21/20: IMPRESSION: 1. Indeterminate left breast mass at 3 o'clock 5 cm from the nipple measuring 1.2 cm with several smaller adjacent masses, possibly ductal. 2. Indeterminate complex mass in the left breast at 5 o'clock measuring 1.3 cm. 3.  Multiple additional probably benign left breast masses as above.  Assessment and Plan: This is a 44 y.o. female with a left breast fibroadenoma with focal ALH and a complex fibroadenoma.  -Discussed with the patient the significance of these findings.  In theory there is a small risk with a fibroadenoma containing a focal area of [place] that this could be upgraded to DCIS or invasive but it is very low.  Also this is a focal finding on the may have been excised with the biopsy itself.  The complex fibroadenoma is more controversial and there are studies that show increased risk and studies that did not show any increased risk for breast cancer.  However, the patient does have a strong family history with a maternal grandmother and 2 maternal aunts having had breast cancer.  I think because of this, I am leaning more towards recommending excision of these 2 areas.  I discussed with her the pros and cons of conservative measures versus excision.  Discussed with her that if she does proceed with surgery, we would do this via lumpectomy of 2 radiofrequency tagged areas as an outpatient procedure.  Reviewed with her the risks of bleeding, infection, and injury to surrounding structures.  At this  point, the patient is unsure what she wants to do and will think about it at home.  She will contact us with her decision.  Discussed with her that there is no true urgency or emergency in doing this procedure given the fact right now we do not have a diagnosis of cancer.  This can wait until after the holidays. -She will contact us with her decision.  If no surgery is gone, then she would need close MRI and/or ultrasound follow-up of these masses.  Face-to-face time spent with the patient and care providers was 60 minutes, with more than 50% of the time spent counseling, educating, and coordinating care of the patient.     Melvyn Neth, [facility]

## 2020-07-14 NOTE — Patient Instructions (Addendum)
Call us if with your decision if you want to do surgery or if you would like to just observe for now.   You may try Evening Primrose Oil supplements for breast pain. 3 grams daily for 6 months.   _______________________________________________________________________________________  We have spoken today about removing a lump in your breast. This would be done by Dr. Hampton Abbot at [facility].  You will most likely be able to leave the hospital several hours after your surgery. Rarely, a patient needs to stay over night but this is a possibility.  Plan to tenatively be off work for 2-4 days following the surgery.     Lumpectomy A lumpectomy is a form of "breast conserving" or "breast preservation" surgery. It may also be referred to as a partial mastectomy. During a lumpectomy, the portion of the breast that contains the cancerous tumor or breast mass (the lump) is removed. Some normal tissue around the lump may also be removed to make sure all of the tumor has been removed.  LET [facility] CARE PROVIDER KNOW ABOUT:  Any allergies you have.  All medicines you are taking, including vitamins, herbs, eye drops, creams, and over-the-counter medicines.  Previous problems you or members of your family have had with the use of anesthetics.  Any blood disorders you have.  Previous surgeries you have had.  Medical conditions you have. RISKS AND COMPLICATIONS Generally, this is a safe procedure. However, problems can occur and include:  Bleeding.  Infection.  Pain.  Temporary swelling.  Change in the shape of the breast, particularly if a large portion is removed. BEFORE THE PROCEDURE  Ask your health care provider about changing or stopping your regular medicines. This is especially important if you are taking diabetes medicines or blood thinners.  Do not eat or drink anything after midnight on the night before the procedure or as directed by your health care provider. Ask your health care  provider if you can take a sip of water with any approved medicines.  On the day of surgery, your health care provider will use a mammogram or ultrasound to locate and mark the tumor in your breast. These markings on your breast will show where the cut (incision) will be made. PROCEDURE   An IV tube will be put into one of your veins.  You may be given medicine to help you relax before the surgery (sedative). You will be given one of the following:  A medicine that numbs the area (local anesthetic).  A medicine that makes you fall asleep (general anesthetic).  Your health care provider will use a kind of electric scalpel that uses heat to minimize bleeding (electrocautery knife).  A curved incision (like a smile or frown) that follows the natural curve of your breast is made, to allow for minimal scarring and better healing.  The tumor will be removed with some of the surrounding tissue. This will be sent to the lab for analysis. Your health care provider may also remove your lymph nodes at this time if needed.  Sometimes, but not always, a rubber tube called a drain will be surgically inserted into your breast area or armpit to collect excess fluid that may accumulate in the space where the tumor was. This drain is connected to a plastic bulb on the outside of your body. This drain creates suction to help remove the fluid.  The incisions will be closed with stitches (sutures).  A bandage may be placed over the incisions. AFTER THE PROCEDURE  You will be taken to the recovery area.  You will be given medicine for pain.  A small rubber drain may be placed in the breast for 2-3 days to prevent a collection of blood (hematoma) from developing in the breast. You will be given instructions on caring for the drain before you go home.  A pressure bandage (dressing) will be applied for 1-2 days to prevent bleeding. Ask your health care provider how to care for your bandage at home.   This  information is not intended to replace advice given to you by your health care provider. Make sure you discuss any questions you have with your health care provider.   Document Released: 08/28/2006 Document Revised: 08/07/2014 Document Reviewed: 12/20/2012 Elsevier Interactive Patient Education 2016 Elsevier Inc.    Atypical Hyperplasia of the Breast Atypical hyperplasia of the breast is a condition in which some cells in the breast are unusual or abnormal. The cells can be found in the ducts or lobules of the breast. Compared to normal breast cells, the cells seen in atypical hyperplasia:  Grow faster than normal breast cells.  Are organized in an unusual way.  Grow in larger numbers than normal breast cells (overproduction).  Are unusual in size and shape. Atypical hyperplasia is not a form of breast cancer. However, it may:  Make you more likely to develop breast cancer.  Turn into cancer (pre-cancerous), if it is not treated. What are the causes? The cause of this condition is not known. What increases the risk? The following factors may make you more likely to develop this condition:  Age. Your risk increases as you get older.  Certain inherited genes.  Family or personal history of breast cancer.  Having prior radiation treatments to your breasts or chest area.  Obesity.  Using or having used hormones, such as estrogen.  Starting menstruation before the age of 14.  Going through menopause after age 40.  Drinking more than one alcoholic beverage a day.  Never having given birth.  Giving birth to your first child when you were over the age of 70.  Not breastfeeding, if you have given birth. What are the signs or symptoms? There are no symptoms of this condition. How is this diagnosed?  This condition is usually discovered during a routine X-ray study of the breasts (mammogram). If something concerning is found on a mammogram, a biopsy is performed. This  means that a small sample of breast tissue is removed and sent to a lab. In most cases, breast cells can be removed through a needle. Sometimes, a small cut (incision) will need to be made in the breast to remove a sample of breast tissue. Atypical hyperplasia can then be diagnosed by examining the breast cells under a microscope. How is this treated? This condition may be treated with:  More frequent monitoring of breast health with: ? Clinical breast exams. ? Mammograms. ? Ultrasounds. ? MRIs.  Medicines to keep the abnormal cells from spreading and becoming cancerous (chemoprevention).  A lumpectomy. This is the removal of the area of abnormal cells, along with some of the normal tissue in the area. This may also be called breast-conserving surgery.  A simple mastectomy. This is the removal of breast tissue, the nipple, and the circle of colored tissue around the nipple (areola). Sometimes, one or more lymph nodes from under the arm are also removed.  A preventive mastectomy. This is the removal of both breasts. This is usually only done if you  have a very high risk of developing breast cancer. Follow these instructions at home: Breast health  Examine your breasts after every menstrual period. If you do not have menstrual periods, check your breasts on the first day of every month. Feel for changes in your breasts, such as: ? More tenderness. ? A new growth. ? A change in size or shape. ? A change in an existing lump. Alcohol use  Do not drink alcohol if: ? Your health care provider tells you not to drink. ? You are pregnant, may be pregnant, or are planning to become pregnant.  If you drink alcohol, limit how much you have to 0-1 drink a day.  Be aware of how much alcohol is in your drink. In the [place] one drink equals one typical bottle of beer (12 oz), one-half glass of wine (5 oz), or one shot of hard liquor (1 oz). General instructions  Take over-the-counter and  prescription medicines only as told by your health care provider.  Eat a healthy diet. This includes low-fat dairy products, lean meats, and fiber-rich foods, such as fruits, vegetables, and whole grains. To get more fiber: ? Make sure half your plate is filled with fruits or vegetables. ? Choose high-fiber foods such as whole-grain breads and cereals.  Do not use any products that contain nicotine or tobacco, such as cigarettes and e-cigarettes. If you need help quitting, ask your health care provider.  Keep all follow-up visits as told by your health care provider. This is important. Contact a health care provider if you have:  A fever.  A change in shape or size of your breasts or nipples.  A change in the skin of your breast or nipples, such as a reddened or scaly area.  Unusual discharge from your nipples.  A lump or thick area that was not there before.  Pain in your breasts.  Anything that concerns you. Get help right away if you have:  Trouble breathing.  Chest pain.  Redness of your breast and the redness is spreading. Summary  Atypical hyperplasia of the breast is a condition in which some cells in the breast are unusual or abnormal.  Atypical hyperplasia is not a form of breast cancer. However, it may put you at greater risk for developing breast cancer.  This condition is usually discovered during a routine X-ray study of the breasts (mammogram).  Examine your breasts after every menstrual period. If you do not have menstrual periods, check your breasts on the first day of every month. Let your health care provider know if you notice any changes in your breasts. This information is not intended to replace advice given to you by your health care provider. Make sure you discuss any questions you have with your health care provider. Document Revised: 08/17/2017 Document Reviewed: 08/16/2017 Elsevier Patient Education  2020 [employer].

## 2020-07-15 ENCOUNTER — Other Ambulatory Visit: Payer: Self-pay | Admitting: Surgery

## 2020-07-15 ENCOUNTER — Telehealth: Payer: Self-pay

## 2020-07-15 DIAGNOSIS — D242 Benign neoplasm of left breast: Secondary | ICD-10-CM

## 2020-07-15 NOTE — Telephone Encounter (Signed)
Spoke with the patient and December 30th is alright for surgery. She is taking the following week off of surgery for recovery but may return earlier. She is aware to contact [employer] to have them fax over her FMLA.

## 2020-07-15 NOTE — Telephone Encounter (Signed)
Message left for the patient The 28th is not available, but we can do the 30th for surgery. As far as FMLA, she needs to contact [employer] and have them fax the forms to [place]. They should have our information, but our fax number is [phone]. I have asked the patient to call us back.

## 2020-07-16 ENCOUNTER — Ambulatory Visit: Payer: 59 | Admitting: Family Medicine

## 2020-07-16 ENCOUNTER — Encounter: Payer: Self-pay | Admitting: Family Medicine

## 2020-07-16 ENCOUNTER — Ambulatory Visit (INDEPENDENT_AMBULATORY_CARE_PROVIDER_SITE_OTHER): Payer: 59 | Admitting: Family Medicine

## 2020-07-16 ENCOUNTER — Other Ambulatory Visit: Payer: Self-pay

## 2020-07-16 ENCOUNTER — Telehealth: Payer: Self-pay | Admitting: Surgery

## 2020-07-16 ENCOUNTER — Other Ambulatory Visit: Payer: Self-pay | Admitting: Family Medicine

## 2020-07-16 VITALS — BP 150/95 | HR 110 | Temp 97.5°F | Ht 66.0 in | Wt 240.0 lb

## 2020-07-16 DIAGNOSIS — M549 Dorsalgia, unspecified: Secondary | ICD-10-CM | POA: Diagnosis not present

## 2020-07-16 DIAGNOSIS — R202 Paresthesia of skin: Secondary | ICD-10-CM

## 2020-07-16 DIAGNOSIS — M79622 Pain in left upper arm: Secondary | ICD-10-CM

## 2020-07-16 MED ORDER — BACLOFEN 10 MG PO TABS
5.0000 mg | ORAL_TABLET | Freq: Three times a day (TID) | ORAL | 1 refills | Status: DC | PRN
Start: 2020-07-16 — End: 2020-07-16

## 2020-07-16 MED ORDER — GABAPENTIN 100 MG PO CAPS: ORAL_CAPSULE | ORAL | 1 refills | Status: DC

## 2020-07-16 NOTE — Patient Instructions (Addendum)
Thank you for coming to the office today.  Start Gabapentin 118m capsules, take at night for 2-3 nights only, and then increase to 2 times a day for a few days, and then may increase to 3 times a day, it may make you drowsy, if helps significantly at night only, then you can increase instead to 3 capsules at night, instead of 3 times a day - In the future if needed, we can significantly increase the dose if tolerated well, some common doses are 3081mthree times a day up to 6008mhree times a day, usually it takes several weeks or months to get to higher doses   Start taking Baclofen (Lioresal) 15m90muscle relaxant) - start with half (cut) to one whole pill at night as needed for next 1-3 nights (may make you drowsy, caution with driving) see how it affects you, then if tolerated increase to one pill 2 to 3 times a day or (every 8 hours as needed)  [facility] [address] [id number]: [phone] schedule a Follow-up Appointment to: Return if symptoms worsen or fail to improve, for left arm pain.  If you have any other questions or concerns, please feel free to call the office or send a message through [facility] may also schedule an earlier appointment if necessary.  Additionally, you may be receiving a survey about your experience at our office within a few days to 1 week by e-mail or mail. We value your feedback.  AlexNobie Putnam [place]

## 2020-07-16 NOTE — Progress Notes (Signed)
Subjective:    Patient ID: Linda Vega, female    DOB: January 24, 1976, 44 y.o.   MRN: [id number]  Linda Vega is a 44 y.o. female presenting on 07/16/2020 for Shoulder Pain (Left side --onset month)   HPI    LEFT UPPER EXTREMITY / SHOULDER PAIN - Last visit with me 12/2019, for initial visit for same problem, treated with conservative care, medication, exercises, see prior notes for background information. - Interval update with limited benefit, tried OTC Tylenol (limited relief) switched to Ibuprofen (temporary relief), did exercise and stretching regimen. Causing difficulty sleeping at night at time. - Today patient reports now still has persistent problem gradual worsening since last visit 6 months ago, seems to radiate from Left upper back scapula down left arm and elbow and to wrist/forearm. - unable to identify trigger or cause - Admits some paresthesia only at times if laying on it at night will wake up. Often it is somewhat improved if laying on Left shoulder or raising the arm.   Depression screen [facility] 2/9 01/14/2020 09/12/2019 07/16/2019  Decreased Interest 0 0 0  Down, Depressed, Hopeless 0 0 0  PHQ - 2 Score 0 0 0  Altered sleeping - 0 -  Tired, decreased energy - 0 -  Change in appetite - 0 -  Feeling bad or failure about yourself  - 0 -  Trouble concentrating - 1 -  Moving slowly or fidgety/restless - 0 -  Suicidal thoughts - 0 -  PHQ-9 Score - 1 -  Difficult doing work/chores - Very difficult -    Social History   Tobacco Use  . Smoking status: Never Smoker  . Smokeless tobacco: Never Used  Vaping Use  . Vaping Use: Never used  Substance Use Topics  . Alcohol use: Yes    Comment: occasional, about 3x a month  . Drug use: No    Review of Systems Per HPI unless specifically indicated above     Objective:    BP (!) 150/95   Pulse (!) 110   Temp (!) 97.5 F (36.4 C) (Temporal)   Ht 5' 6"  (1.676 m)   Wt 240 lb (108.9 kg)   LMP 10/04/2019 (Exact  Date)   SpO2 98%   BMI 38.74 kg/m   Wt Readings from Last 3 Encounters:  07/16/20 240 lb (108.9 kg)  07/14/20 240 lb 9.6 oz (109.1 kg)  01/23/20 234 lb 9.6 oz (106.4 kg)    Physical Exam Vitals and nursing note reviewed.  Constitutional:      General: She is not in acute distress.    Appearance: She is well-developed. She is obese. She is not diaphoretic.     Comments: Well-appearing, comfortable, cooperative  HENT:     Head: Normocephalic and atraumatic.  Eyes:     General:        Right eye: No discharge.        Left eye: No discharge.     Conjunctiva/sclera: Conjunctivae normal.  Cardiovascular:     Rate and Rhythm: Normal rate.  Pulmonary:     Effort: Pulmonary effort is normal.  Musculoskeletal:     Comments: Left Shoulder Inspection: Normal appearance bilateral symmetrical Palpation: Mild tender to palpation posterior shoulder / scapular area and levator scap only with some muscle spasm hypertonicity ROM: Full intact active ROM forward flexion, abduction, internal / external rotation, symmetrical Special Testing: Rotator cuff testing negative for weakness with supraspinatus full can and empty can test, Hawkin's AC impingement negative  for pain Strength: Normal strength 5/5 flex/ext, ext rot / int rot, grip, rotator cuff str testing. Neurovascular: Distally intact pulses, sensation to light touch  Right grip normal. Index/middle fingers do not have any deformity, no fibrous nodularity or abnormality of tendons. Full range of motion at this time.  Skin:    General: Skin is warm and dry.     Findings: No erythema or rash.  Neurological:     Mental Status: She is alert and oriented to person, place, and time.  Psychiatric:        Behavior: Behavior normal.     Comments: Well groomed, good eye contact, normal speech and thoughts    Results for orders placed or performed during the hospital encounter of 07/05/20  Surgical pathology  Result Value Ref Range   SURGICAL  PATHOLOGY      SURGICAL PATHOLOGY CASE: [id number] PATIENT: [facility] Surgical Pathology Report     Specimen Submitted: A. Breast, left, 3:00 5 cm from nipple; [place] bx B. Breast, left, 5:00 8 cm from nipple; [place] bx  Clinical History: Strong maternal family history of breast carcinoma. Two left breast masses are biopsied; multiple additional smaller masses are present. 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 1. Left breast at 3 o'clock 5 cm from nipple: Solid oval mass, 1.2 cm, possible FA, papilloma, rule out atypia and malignancy, heart-shaped clip placed. 2. Left breast at 5 o'clock 8 cm from nipple: Complex cystic and solid mass, 1.3 cm, ? papillary, X-shaped clip placed. Post-biopsy mammograms show appropriate positioning of the heart-shaped biopsy marking clip at the site of biopsy in the left breast at 3 o'clock, and appropriate positioning of the X-shaped biopsy marking clip at the site of biopsy in the left breast at 5 o'cl ock.     DIAGNOSIS: A. BREAST, LEFT, 3 O'CLOCK 5 CM FROM NIPPLE (HEART-SHAPED CLIP); ULTRASOUND-GUIDED CORE BIOPSY: - FIBROADENOMA. - FOCAL ATYPICAL LOBULAR HYPERPLASIA ([place]) IS PRESENT WITHIN THE FIBROADENOMA. - NO IN SITU OR INVASIVE CARCINOMA IS IDENTIFIED IN THIS SAMPLE.  B. BREAST, LEFT, 5 O'CLOCK 8 CM FROM NIPPLE (X-SHAPED CLIP); ULTRASOUND-GUIDED CORE BIOPSY: - COMPLEX FIBROADENOMA WITH CYSTIC CHANGE. - NEGATIVE FOR ATYPIA AND MALIGNANCY IN THIS SAMPLE.  GROSS DESCRIPTION: A. Labeled: Left breast 3:00 5 cm from nipple Received: Formalin Time/date in fixative: Collected and placed in formalin at 8:30 AM on 07/05/2020 Cold ischemic time: Less than 1 minute Total fixation time: 8.5 hours Core pieces: 4 Size: Range from 1-1.1 cm in length and 0.2 cm in diameter Description: Received are cores of tan-white fibrofatty tissue. Ink color: Blue Entirely submitted in cassettes 1-2 with 2 cores  per cassette.  B. Labeled: Left breast 5:00 8 cm from nipple Rece ived: Formalin Time/date in fixative: Collected and placed in formalin at 8:42 AM on 07/05/2020 Cold ischemic time: Less than 1 minute Total fixation time: 8.5 hours Core pieces: 4 Size: Range from 0.6-1.4 cm in length and 0.2 cm in diameter Description: Received are cores of yellow and pink fibrofatty tissue. Ink color: Black Entirely submitted in cassettes 1-2 with 2 cores per cassette.   Final Diagnosis performed by Bryan Lemma, MD.   Electronically signed 07/06/2020 5:56:28PM The electronic signature indicates that the named Attending Pathologist has evaluated the specimen Technical component performed at [facility], [address], [place], [place] [address] Lab: [id number] Dir: Rush Farmer, MD, MMM  Professional component performed at [facility], [facility], [place], [place], [place] [address] Lab: [phone]  Dir: Dellia Nims. Rubinas, MD       Assessment & Plan:   Problem List Items Addressed This Visit   None   Visit Diagnoses    Paresthesia of left upper extremity    -  Primary   Relevant Medications   gabapentin (NEURONTIN) 100 MG capsule   Other Relevant Orders   Ambulatory referral to Neurology   Upper back pain on left side       Relevant Medications   gabapentin (NEURONTIN) 100 MG capsule   baclofen (LIORESAL) 10 MG tablet   Other Relevant Orders   Ambulatory referral to Neurology   Left upper arm pain       Relevant Medications   gabapentin (NEURONTIN) 100 MG capsule   Other Relevant Orders   Ambulatory referral to Neurology      Clinically seems more neuropathic vs MSK, has full ROM Re order Gabapentin trial for this particular problem, refill Baclofen Referral to Neurology chronic >6 months now left upper back / arm pain with paresthesia and neuropathic symptoms, also has associated muscle spasm and pain, but history is most suggestive of neuropathic origin given  path of pain and radiation into left wrist. May warrant nerve conduction study and other diagnostic testing / treatment options. May need PT in future if ultimately more muscular but prefer neurology for more diagnostic eval     Orders Placed This Encounter  Procedures  . Ambulatory referral to Neurology    Referral Priority:   Routine    Referral Type:   Consultation    Referral Reason:   Specialty Services Required    Requested Specialty:   Neurology    Number of Visits Requested:   1     Meds ordered this encounter  Medications  . gabapentin (NEURONTIN) 100 MG capsule    Sig: Start 1 capsule daily, increase by 1 cap every 2-3 days as tolerated up to 3 times a day, or may take 3 at once in evening.    Dispense:  90 capsule    Refill:  1  . baclofen (LIORESAL) 10 MG tablet    Sig: Take 0.5-1 tablets (5-10 mg total) by mouth 3 (three) times daily as needed for muscle spasms.    Dispense:  30 each    Refill:  1      Follow up plan: Return if symptoms worsen or fail to improve, for left arm pain.   [name], [facility] 07/16/2020, 1:56 PM

## 2020-07-16 NOTE — Telephone Encounter (Signed)
Patient has been advised of Pre-Admission date/time, COVID Testing date and Surgery date.  Surgery Date: 07/29/20 Preadmission Testing Date: 07/21/20 (phone 8a-1p) Covid Testing Date: 07/27/20 - patient advised to go to the [place] ([place] between 8a-1p   Patient has been made aware to call [phone], between 1-3:00pm the day before surgery, to find out what time to arrive for surgery.

## 2020-07-19 ENCOUNTER — Encounter: Payer: Self-pay | Admitting: Surgery

## 2020-07-21 ENCOUNTER — Encounter
Admission: RE | Admit: 2020-07-21 | Discharge: 2020-07-21 | Disposition: A | Discharge status: Home / Self Care | Payer: 59 | Source: Ambulatory Visit | Attending: Surgery | Admitting: Surgery

## 2020-07-21 ENCOUNTER — Ambulatory Visit: Payer: 59 | Admitting: Family Medicine

## 2020-07-21 DIAGNOSIS — I10 Essential (primary) hypertension: Secondary | ICD-10-CM | POA: Diagnosis not present

## 2020-07-21 DIAGNOSIS — Z01812 Encounter for preprocedural laboratory examination: Secondary | ICD-10-CM | POA: Diagnosis not present

## 2020-07-21 HISTORY — DX: Gastro-esophageal reflux disease without esophagitis: K21.9

## 2020-07-21 HISTORY — DX: Headache, unspecified: R51.9

## 2020-07-21 NOTE — Patient Instructions (Addendum)
Your procedure is scheduled on:07-29-20 THURSDAY Report to the Registration Desk on the 1st floor of the [facility]-Then proceed to the 2nd floor Surgery Desk in the [place] To find out your arrival time, please call [phone] between 1PM - 3PM on:07-28-20 WEDNESDAY  REMEMBER: Instructions that are not followed completely may result in serious medical risk, up to and including death; or upon the discretion of your surgeon and anesthesiologist your surgery may need to be rescheduled.  Do not eat food after midnight the night before surgery.  No gum chewing, lozengers or hard candies.  You may however, drink CLEAR liquids up to 2 hours before you are scheduled to arrive for your surgery. Do not drink anything within 2 hours of your scheduled arrival time.  Clear liquids include: - water  - apple juice without pulp - gatorade (not RED, PURPLE, OR BLUE) - black coffee or tea (Do NOT add milk or creamers to the coffee or tea) Do NOT drink anything that is not on this list.  TAKE THESE MEDICATIONS THE MORNING OF SURGERY WITH A SIP OF WATER: -NORVASC (AMLODIPINE) -PRILOSEC (OMEPRAZOLE)-take one the night before and one on the morning of surgery - helps to prevent nausea after surgery.)  One week prior to surgery: Stop Anti-inflammatories (NSAIDS) such as Advil, Aleve, Ibuprofen, Motrin, Naproxen, Naprosyn and Aspirin based products such as Excedrin, Goodys Powder, BC Powder-OK TO TAKE TYLENOL IF NEEDED  Stop ANY OVER THE COUNTER supplements until after surgery. (However, you may continue taking Vitamin D and Dairy Relief up until the day before surgery.)  No Alcohol for 24 hours before or after surgery.  No Smoking including e-cigarettes for 24 hours prior to surgery.  No chewable tobacco products for at least 6 hours prior to surgery.  No nicotine patches on the day of surgery.  Do not use any "recreational" drugs for at least a week prior to your surgery.  Please be advised  that the combination of cocaine and anesthesia may have negative outcomes, up to and including death. If you test positive for cocaine, your surgery will be cancelled.  On the morning of surgery brush your teeth with toothpaste and water, you may rinse your mouth with mouthwash if you wish. Do not swallow any toothpaste or mouthwash.  Do not wear jewelry, make-up, hairpins, clips or nail polish.  Do not wear lotions, powders, or perfumes.   Do not shave body from the neck down 48 hours prior to surgery just in case you cut yourself which could leave a site for infection.  Also, freshly shaved skin may become irritated if using the CHG soap.  Contact lenses, hearing aids and dentures may not be worn into surgery.  Do not bring valuables to the hospital. [facility] is not responsible for any missing/lost belongings or valuables.   Use CHG Soap as directed on instruction sheet.  Notify your doctor if there is any change in your medical condition (cold, fever, infection).  Wear comfortable clothing (specific to your surgery type) to the hospital.  Plan for stool softeners for home use; pain medications have a tendency to cause constipation. You can also help prevent constipation by eating foods high in fiber such as fruits and vegetables and drinking plenty of fluids as your diet allows.  After surgery, you can help prevent lung complications by doing breathing exercises.  Take deep breaths and cough every 1-2 hours. Your doctor may order a device called an Incentive Spirometer to help you take  deep breaths. When coughing or sneezing, hold a pillow firmly against your incision with both hands. This is called "splinting." Doing this helps protect your incision. It also decreases belly discomfort.  If you are being admitted to the hospital overnight, leave your suitcase in the car. After surgery it may be brought to your room.  If you are being discharged the day of surgery, you will not  be allowed to drive home. You will need a responsible adult (18 years or older) to drive you home and stay with you that night.   If you are taking public transportation, you will need to have a responsible adult (18 years or older) with you. Please confirm with your physician that it is acceptable to use public transportation.   Please call the [place] [facility] at [phone] if you have any questions about these instructions.  Visitation Policy:  Patients undergoing a surgery or procedure may have one family member or support person with them as long as that person is not COVID-19 positive or experiencing its symptoms.  That person may remain in the waiting area during the procedure.  Inpatient Visitation Update:   In an effort to ensure the safety of our team members and our patients, we are implementing a change to our visitation policy:  Effective Monday, Aug. 9, at 7 a.m., inpatients will be allowed one support person.  o The support person may change daily.  o The support person must pass our screening, gel in and out, and wear a mask at all times, including in the patient's room.  o Patients must also wear a mask when staff or their support person are in the room.  o Masking is required regardless of vaccination status.  Systemwide, no visitors 17 or younger.

## 2020-07-21 NOTE — Addendum Note (Signed)
Addended by: Caroleen Hamman F on: 07/21/2020 04:18 PM   Modules accepted: Orders, SmartSet

## 2020-07-22 ENCOUNTER — Other Ambulatory Visit: Payer: Self-pay

## 2020-07-22 ENCOUNTER — Encounter
Admission: RE | Admit: 2020-07-22 | Discharge: 2020-07-22 | Disposition: A | Discharge status: Home / Self Care | Payer: 59 | Source: Ambulatory Visit | Attending: Surgery | Admitting: Surgery

## 2020-07-22 DIAGNOSIS — I10 Essential (primary) hypertension: Secondary | ICD-10-CM | POA: Diagnosis not present

## 2020-07-22 DIAGNOSIS — Z01812 Encounter for preprocedural laboratory examination: Secondary | ICD-10-CM | POA: Diagnosis not present

## 2020-07-27 ENCOUNTER — Telehealth: Payer: Self-pay | Admitting: *Deleted

## 2020-07-27 ENCOUNTER — Other Ambulatory Visit: Payer: Self-pay

## 2020-07-27 ENCOUNTER — Ambulatory Visit
Admission: RE | Admit: 2020-07-27 | Discharge: 2020-07-27 | Disposition: A | Discharge status: Home / Self Care | Payer: 59 | Source: Ambulatory Visit | Attending: Surgery | Admitting: Surgery

## 2020-07-27 ENCOUNTER — Other Ambulatory Visit
Admission: RE | Admit: 2020-07-27 | Discharge: 2020-07-27 | Disposition: A | Discharge status: Home / Self Care | Payer: 59 | Source: Ambulatory Visit | Attending: Surgery | Admitting: Surgery

## 2020-07-27 DIAGNOSIS — D242 Benign neoplasm of left breast: Secondary | ICD-10-CM | POA: Diagnosis not present

## 2020-07-27 DIAGNOSIS — Z20822 Contact with and (suspected) exposure to covid-19: Secondary | ICD-10-CM | POA: Diagnosis not present

## 2020-07-27 DIAGNOSIS — Z01818 Encounter for other preprocedural examination: Secondary | ICD-10-CM | POA: Diagnosis present

## 2020-07-27 DIAGNOSIS — N6092 Unspecified benign mammary dysplasia of left breast: Secondary | ICD-10-CM | POA: Diagnosis not present

## 2020-07-27 LAB — SARS CORONAVIRUS 2 (TAT 6-24 HRS): SARS Coronavirus 2: NEGATIVE

## 2020-07-27 NOTE — Telephone Encounter (Signed)
Faxed FMLA to [employer] at [phone]

## 2020-07-28 ENCOUNTER — Telehealth: Payer: Self-pay | Admitting: *Deleted

## 2020-07-28 ENCOUNTER — Other Ambulatory Visit: Payer: Self-pay | Admitting: Dentistry

## 2020-07-28 NOTE — Telephone Encounter (Signed)
Faxed FMLA to [employer] at [phone]

## 2020-07-29 ENCOUNTER — Other Ambulatory Visit: Payer: Self-pay | Admitting: Surgery

## 2020-07-29 ENCOUNTER — Ambulatory Visit
Admission: RE | Admit: 2020-07-29 | Discharge: 2020-07-29 | Disposition: A | Discharge status: Home / Self Care | Payer: 59 | Attending: Surgery | Admitting: Surgery

## 2020-07-29 ENCOUNTER — Encounter: Payer: Self-pay | Admitting: Surgery

## 2020-07-29 ENCOUNTER — Ambulatory Visit
Admission: RE | Admit: 2020-07-29 | Discharge: 2020-07-29 | Disposition: A | Discharge status: Home / Self Care | Payer: 59 | Source: Ambulatory Visit | Attending: Surgery | Admitting: Surgery

## 2020-07-29 ENCOUNTER — Other Ambulatory Visit: Payer: Self-pay

## 2020-07-29 ENCOUNTER — Encounter
Admission: RE | Disposition: A | Discharge status: Home / Self Care | Payer: Self-pay | Source: Home / Self Care | Attending: Surgery

## 2020-07-29 ENCOUNTER — Ambulatory Visit: Payer: 59 | Admitting: Certified Registered"

## 2020-07-29 DIAGNOSIS — D242 Benign neoplasm of left breast: Secondary | ICD-10-CM

## 2020-07-29 DIAGNOSIS — Z803 Family history of malignant neoplasm of breast: Secondary | ICD-10-CM | POA: Diagnosis not present

## 2020-07-29 DIAGNOSIS — M79622 Pain in left upper arm: Secondary | ICD-10-CM

## 2020-07-29 DIAGNOSIS — Z91013 Allergy to seafood: Secondary | ICD-10-CM | POA: Insufficient documentation

## 2020-07-29 DIAGNOSIS — Z79899 Other long term (current) drug therapy: Secondary | ICD-10-CM | POA: Insufficient documentation

## 2020-07-29 DIAGNOSIS — I10 Essential (primary) hypertension: Secondary | ICD-10-CM | POA: Insufficient documentation

## 2020-07-29 DIAGNOSIS — Z8249 Family history of ischemic heart disease and other diseases of the circulatory system: Secondary | ICD-10-CM | POA: Insufficient documentation

## 2020-07-29 DIAGNOSIS — N6022 Fibroadenosis of left breast: Secondary | ICD-10-CM

## 2020-07-29 DIAGNOSIS — Z91012 Allergy to eggs: Secondary | ICD-10-CM | POA: Insufficient documentation

## 2020-07-29 DIAGNOSIS — R928 Other abnormal and inconclusive findings on diagnostic imaging of breast: Secondary | ICD-10-CM | POA: Diagnosis not present

## 2020-07-29 DIAGNOSIS — Z91018 Allergy to other foods: Secondary | ICD-10-CM | POA: Insufficient documentation

## 2020-07-29 DIAGNOSIS — Z888 Allergy status to other drugs, medicaments and biological substances status: Secondary | ICD-10-CM | POA: Diagnosis not present

## 2020-07-29 DIAGNOSIS — S46912A Strain of unspecified muscle, fascia and tendon at shoulder and upper arm level, left arm, initial encounter: Secondary | ICD-10-CM

## 2020-07-29 HISTORY — PX: BREAST EXCISIONAL BIOPSY: SUR124

## 2020-07-29 HISTORY — PX: BREAST LUMPECTOMY WITH RADIOFREQUENCY TAG IDENTIFICATION: SHX6884

## 2020-07-29 SURGERY — BREAST LUMPECTOMY WITH RADIOFREQUENCY TAG IDENTIFICATION
Anesthesia: General | Laterality: Left

## 2020-07-29 MED ORDER — CEFAZOLIN SODIUM-DEXTROSE 2-4 GM/100ML-% IV SOLN
INTRAVENOUS | Status: AC
  Filled 2020-07-29: qty 100

## 2020-07-29 MED ORDER — GABAPENTIN 300 MG PO CAPS: 300.0000 mg | ORAL_CAPSULE | ORAL | Status: AC

## 2020-07-29 MED ORDER — LIDOCAINE HCL (CARDIAC) PF 100 MG/5ML IV SOSY
PREFILLED_SYRINGE | INTRAVENOUS | Status: DC | PRN
  Administered 2020-07-29: 100 mg via INTRAVENOUS

## 2020-07-29 MED ORDER — PROPOFOL 500 MG/50ML IV EMUL
INTRAVENOUS | Status: DC | PRN
  Administered 2020-07-29: 20 ug/kg/min via INTRAVENOUS

## 2020-07-29 MED ORDER — CELECOXIB 200 MG PO CAPS: 200.0000 mg | ORAL_CAPSULE | ORAL | Status: AC

## 2020-07-29 MED ORDER — DEXAMETHASONE SODIUM PHOSPHATE 10 MG/ML IJ SOLN
INTRAMUSCULAR | Status: AC
  Filled 2020-07-29: qty 1

## 2020-07-29 MED ORDER — BUPIVACAINE-EPINEPHRINE 0.5% -1:200000 IJ SOLN
INTRAMUSCULAR | Status: DC | PRN
  Administered 2020-07-29: 30 mL

## 2020-07-29 MED ORDER — LACTATED RINGERS IV SOLN: INTRAVENOUS | Status: DC

## 2020-07-29 MED ORDER — ACETAMINOPHEN 500 MG PO TABS: 1000.0000 mg | ORAL_TABLET | Freq: Four times a day (QID) | ORAL | Status: DC | PRN

## 2020-07-29 MED ORDER — ACETAMINOPHEN 500 MG PO TABS: 1000.0000 mg | ORAL_TABLET | ORAL | Status: AC

## 2020-07-29 MED ORDER — FENTANYL CITRATE (PF) 100 MCG/2ML IJ SOLN
INTRAMUSCULAR | Status: AC
  Filled 2020-07-29: qty 2

## 2020-07-29 MED ORDER — ONDANSETRON HCL 4 MG/2ML IJ SOLN
INTRAMUSCULAR | Status: AC
  Filled 2020-07-29: qty 4

## 2020-07-29 MED ORDER — CHLORHEXIDINE GLUCONATE 0.12 % MT SOLN
OROMUCOSAL | Status: AC
  Administered 2020-07-29: 15 mL via OROMUCOSAL
  Filled 2020-07-29: qty 15

## 2020-07-29 MED ORDER — ORAL CARE MOUTH RINSE: 15.0000 mL | Freq: Once | OROMUCOSAL | Status: AC

## 2020-07-29 MED ORDER — GLYCOPYRROLATE 0.2 MG/ML IJ SOLN
INTRAMUSCULAR | Status: AC
  Filled 2020-07-29: qty 1

## 2020-07-29 MED ORDER — ONDANSETRON HCL 4 MG/2ML IJ SOLN
INTRAMUSCULAR | Status: DC | PRN
  Administered 2020-07-29 (×2): 4 mg via INTRAVENOUS

## 2020-07-29 MED ORDER — FENTANYL CITRATE (PF) 100 MCG/2ML IJ SOLN: 25.0000 ug | INTRAMUSCULAR | Status: DC | PRN

## 2020-07-29 MED ORDER — CHLORHEXIDINE GLUCONATE 0.12 % MT SOLN: 15.0000 mL | Freq: Once | OROMUCOSAL | Status: AC

## 2020-07-29 MED ORDER — FENTANYL CITRATE (PF) 100 MCG/2ML IJ SOLN
INTRAMUSCULAR | Status: DC | PRN
  Administered 2020-07-29: 50 ug via INTRAVENOUS
  Administered 2020-07-29 (×4): 25 ug via INTRAVENOUS

## 2020-07-29 MED ORDER — PROPOFOL 10 MG/ML IV BOLUS
INTRAVENOUS | Status: DC | PRN
  Administered 2020-07-29: 200 mg via INTRAVENOUS

## 2020-07-29 MED ORDER — SODIUM CHLORIDE 0.9 % IV SOLN
150.0000 mg | Freq: Once | INTRAVENOUS | Status: AC
  Administered 2020-07-29: 150 mg via INTRAVENOUS
  Filled 2020-07-29: qty 5

## 2020-07-29 MED ORDER — CEFAZOLIN SODIUM-DEXTROSE 2-4 GM/100ML-% IV SOLN
2.0000 g | INTRAVENOUS | Status: AC
  Administered 2020-07-29: 2 g via INTRAVENOUS

## 2020-07-29 MED ORDER — OXYCODONE HCL 5 MG PO TABS: 5.0000 mg | ORAL_TABLET | ORAL | 0 refills | Status: DC | PRN

## 2020-07-29 MED ORDER — GLYCOPYRROLATE 0.2 MG/ML IJ SOLN
INTRAMUSCULAR | Status: DC | PRN
  Administered 2020-07-29: .2 mg via INTRAVENOUS

## 2020-07-29 MED ORDER — DEXMEDETOMIDINE (PRECEDEX) IN NS 20 MCG/5ML (4 MCG/ML) IV SYRINGE
PREFILLED_SYRINGE | INTRAVENOUS | Status: DC | PRN
  Administered 2020-07-29: 4 ug via INTRAVENOUS
  Administered 2020-07-29: 8 ug via INTRAVENOUS
  Administered 2020-07-29 (×2): 4 ug via INTRAVENOUS

## 2020-07-29 MED ORDER — CHLORHEXIDINE GLUCONATE CLOTH 2 % EX PADS: 6.0000 | MEDICATED_PAD | Freq: Once | CUTANEOUS | Status: DC

## 2020-07-29 MED ORDER — LIDOCAINE HCL (PF) 2 % IJ SOLN
INTRAMUSCULAR | Status: AC
  Filled 2020-07-29: qty 10

## 2020-07-29 MED ORDER — ACETAMINOPHEN 500 MG PO TABS
ORAL_TABLET | ORAL | Status: AC
  Administered 2020-07-29: 1000 mg via ORAL
  Filled 2020-07-29: qty 2

## 2020-07-29 MED ORDER — MIDAZOLAM HCL 2 MG/2ML IJ SOLN
INTRAMUSCULAR | Status: DC | PRN
  Administered 2020-07-29: 2 mg via INTRAVENOUS

## 2020-07-29 MED ORDER — SCOPOLAMINE 1 MG/3DAYS TD PT72: 1.0000 | MEDICATED_PATCH | TRANSDERMAL | Status: DC

## 2020-07-29 MED ORDER — PHENYLEPHRINE HCL (PRESSORS) 10 MG/ML IV SOLN
INTRAVENOUS | Status: AC
  Filled 2020-07-29: qty 1

## 2020-07-29 MED ORDER — SCOPOLAMINE 1 MG/3DAYS TD PT72
MEDICATED_PATCH | TRANSDERMAL | Status: AC
  Administered 2020-07-29: 1.5 mg via TRANSDERMAL
  Filled 2020-07-29: qty 1

## 2020-07-29 MED ORDER — MIDAZOLAM HCL 2 MG/2ML IJ SOLN
INTRAMUSCULAR | Status: AC
  Filled 2020-07-29: qty 2

## 2020-07-29 MED ORDER — ACETAMINOPHEN 10 MG/ML IV SOLN
INTRAVENOUS | Status: AC
  Filled 2020-07-29: qty 100

## 2020-07-29 MED ORDER — DEXAMETHASONE SODIUM PHOSPHATE 10 MG/ML IJ SOLN
INTRAMUSCULAR | Status: DC | PRN
  Administered 2020-07-29: 10 mg via INTRAVENOUS

## 2020-07-29 MED ORDER — BUPIVACAINE-EPINEPHRINE (PF) 0.5% -1:200000 IJ SOLN
INTRAMUSCULAR | Status: AC
  Filled 2020-07-29: qty 30

## 2020-07-29 MED ORDER — PHENYLEPHRINE HCL (PRESSORS) 10 MG/ML IV SOLN
INTRAVENOUS | Status: DC | PRN
  Administered 2020-07-29: 100 ug via INTRAVENOUS
  Administered 2020-07-29 (×2): 200 ug via INTRAVENOUS

## 2020-07-29 MED ORDER — PROMETHAZINE HCL 25 MG/ML IJ SOLN: 6.2500 mg | INTRAMUSCULAR | Status: DC | PRN

## 2020-07-29 MED ORDER — KETOROLAC TROMETHAMINE 30 MG/ML IJ SOLN
INTRAMUSCULAR | Status: DC | PRN
  Administered 2020-07-29: 30 mg via INTRAVENOUS

## 2020-07-29 MED ORDER — EPHEDRINE SULFATE 50 MG/ML IJ SOLN
INTRAMUSCULAR | Status: DC | PRN
  Administered 2020-07-29: 5 mg via INTRAVENOUS

## 2020-07-29 MED ORDER — IBUPROFEN 800 MG PO TABS: 800.0000 mg | ORAL_TABLET | Freq: Three times a day (TID) | ORAL | 1 refills | Status: DC | PRN

## 2020-07-29 MED ORDER — PROPOFOL 500 MG/50ML IV EMUL
INTRAVENOUS | Status: AC
  Filled 2020-07-29: qty 50

## 2020-07-29 MED ORDER — GABAPENTIN 300 MG PO CAPS
ORAL_CAPSULE | ORAL | Status: AC
  Administered 2020-07-29: 300 mg via ORAL
  Filled 2020-07-29: qty 1

## 2020-07-29 MED ORDER — EPHEDRINE 5 MG/ML INJ
INTRAVENOUS | Status: AC
  Filled 2020-07-29: qty 10

## 2020-07-29 MED ORDER — DEXMEDETOMIDINE (PRECEDEX) IN NS 20 MCG/5ML (4 MCG/ML) IV SYRINGE
PREFILLED_SYRINGE | INTRAVENOUS | Status: AC
  Filled 2020-07-29: qty 5

## 2020-07-29 MED ORDER — CELECOXIB 200 MG PO CAPS
ORAL_CAPSULE | ORAL | Status: AC
  Administered 2020-07-29: 200 mg via ORAL
  Filled 2020-07-29: qty 1

## 2020-07-29 MED ORDER — PROPOFOL 10 MG/ML IV BOLUS
INTRAVENOUS | Status: AC
  Filled 2020-07-29: qty 20

## 2020-07-29 SURGICAL SUPPLY — 48 items
APPLIER CLIP 9.375 SM OPEN (CLIP)
BINDER BREAST LRG (GAUZE/BANDAGES/DRESSINGS) IMPLANT
BINDER BREAST MEDIUM (GAUZE/BANDAGES/DRESSINGS) IMPLANT
BLADE PHOTON ILLUMINATED (MISCELLANEOUS) ×2 IMPLANT
BLADE SURG 15 STRL LF DISP TIS (BLADE) ×2 IMPLANT
BLADE SURG 15 STRL SS (BLADE) ×2
CHLORAPREP W/TINT 26 (MISCELLANEOUS) ×2 IMPLANT
CLIP APPLIE 9.375 SM OPEN (CLIP) IMPLANT
CNTNR SPEC 2.5X3XGRAD LEK (MISCELLANEOUS)
CONT SPEC 4OZ STER OR WHT (MISCELLANEOUS)
CONTAINER SPEC 2.5X3XGRAD LEK (MISCELLANEOUS) IMPLANT
COVER PROBE FLX POLY STRL (MISCELLANEOUS) IMPLANT
COVER WAND RF STERILE (DRAPES) IMPLANT
DERMABOND ADVANCED (GAUZE/BANDAGES/DRESSINGS) ×1
DERMABOND ADVANCED .7 DNX12 (GAUZE/BANDAGES/DRESSINGS) ×1 IMPLANT
DEVICE DUBIN SPECIMEN MAMMOGRA (MISCELLANEOUS) ×4 IMPLANT
DRAPE LAPAROTOMY 100X77 ABD (DRAPES) ×2 IMPLANT
DRSG GAUZE FLUFF 36X18 (GAUZE/BANDAGES/DRESSINGS) ×2 IMPLANT
ELECT CAUTERY BLADE 6.4 (BLADE) ×2 IMPLANT
ELECT REM PT RETURN 9FT ADLT (ELECTROSURGICAL) ×2
ELECTRODE REM PT RTRN 9FT ADLT (ELECTROSURGICAL) ×1 IMPLANT
GLOVE SURG SYN 7.0 (GLOVE) ×8 IMPLANT
GLOVE SURG SYN 7.5  E (GLOVE) ×4
GLOVE SURG SYN 7.5 E (GLOVE) ×4 IMPLANT
GOWN STRL REUS W/ TWL LRG LVL3 (GOWN DISPOSABLE) ×2 IMPLANT
GOWN STRL REUS W/TWL LRG LVL3 (GOWN DISPOSABLE) ×2
KIT MARKER MARGIN INK (KITS) ×2 IMPLANT
KIT TURNOVER KIT A (KITS) ×2 IMPLANT
LABEL OR SOLS (LABEL) ×2 IMPLANT
MANIFOLD NEPTUNE II (INSTRUMENTS) ×2 IMPLANT
MARGIN MAP 10MM (MISCELLANEOUS) IMPLANT
MARKER MARGIN CORRECT CLIP (MARKER) IMPLANT
NEEDLE HYPO 22GX1.5 SAFETY (NEEDLE) ×2 IMPLANT
NEEDLE HYPO 25X1 1.5 SAFETY (NEEDLE) ×2 IMPLANT
PACK BASIN MINOR ARMC (MISCELLANEOUS) ×2 IMPLANT
SET LOCALIZER 20 PROBE US (MISCELLANEOUS) ×2 IMPLANT
SUT ETHILON 3-0 FS-10 30 BLK (SUTURE) ×2
SUT MNCRL 4-0 (SUTURE) ×1
SUT MNCRL 4-0 27XMFL (SUTURE) ×1
SUT SILK 3 0 SH 30 (SUTURE) IMPLANT
SUT VIC AB 3-0 SH 27 (SUTURE) ×2
SUT VIC AB 3-0 SH 27X BRD (SUTURE) ×2 IMPLANT
SUTURE EHLN 3-0 FS-10 30 BLK (SUTURE) ×1 IMPLANT
SUTURE MNCRL 4-0 27XMF (SUTURE) ×1 IMPLANT
SYR 10ML LL (SYRINGE) ×2 IMPLANT
SYR BULB IRRIG 60ML STRL (SYRINGE) ×2 IMPLANT
TAPE TRANSPORE STRL 2 31045 (GAUZE/BANDAGES/DRESSINGS) ×2 IMPLANT
WATER STERILE IRR 1000ML POUR (IV SOLUTION) ×2 IMPLANT

## 2020-07-29 NOTE — Interval H&P Note (Signed)
History and Physical Interval Note:  07/29/2020 7:11 AM  Linda Vega  has presented today for surgery, with the diagnosis of Left breast fibroadenomas x 2.  The various methods of treatment have been discussed with the patient and family. After consideration of risks, benefits and other options for treatment, the patient has consented to  Procedure(s): BREAST LUMPECTOMY WITH RADIOFREQUENCY TAG IDENTIFICATION, lumpectomy x 2 (Left) as a surgical intervention.  The patient's history has been reviewed, patient examined, no change in status, stable for surgery.  I have reviewed the patient's chart and labs.  Questions were answered to the patient's satisfaction.     Hutchinson Isenberg

## 2020-07-29 NOTE — Transfer of Care (Signed)
Immediate Anesthesia Transfer of Care Note  Patient: Linda Vega  Procedure(s) Performed: BREAST LUMPECTOMY WITH RADIOFREQUENCY TAG IDENTIFICATION, lumpectomy x 2 (Left )  Patient Location: PACU  Anesthesia Type:General  Level of Consciousness: drowsy, patient cooperative and responds to stimulation  Airway & Oxygen Therapy: Patient Spontanous Breathing and Patient connected to face mask oxygen  Post-op Assessment: Report given to RN and Post -op Vital signs reviewed and stable  Post vital signs: Reviewed and stable  Last Vitals:  Vitals Value Taken Time  BP 121/75 07/29/20 1015  Temp 36.6 C 07/29/20 1015  Pulse 85 07/29/20 1015  Resp 16 07/29/20 1015  SpO2 97 % 07/29/20 1015    Last Pain:  Vitals:   07/29/20 1015  TempSrc: Temporal  PainSc: 0-No pain         Complications: No complications documented.

## 2020-07-29 NOTE — Anesthesia Procedure Notes (Signed)
Procedure Name: LMA Insertion Performed by: Kelton Pillar, CRNA Pre-anesthesia Checklist: Patient identified, Emergency Drugs available, Suction available and Patient being monitored Patient Re-evaluated:Patient Re-evaluated prior to induction Oxygen Delivery Method: Simple face mask Preoxygenation: Pre-oxygenation with 100% oxygen Induction Type: IV induction Ventilation: Mask ventilation without difficulty LMA: LMA inserted LMA Size: 4.0 Placement Confirmation: positive ETCO2 and CO2 detector Tube secured with: Tape Dental Injury: Teeth and Oropharynx as per pre-operative assessment

## 2020-07-29 NOTE — Discharge Instructions (Signed)
AMBULATORY SURGERY  DISCHARGE INSTRUCTIONS   1) The drugs that you were given will stay in your system until tomorrow so for the next 24 hours you should not:  A) Drive an automobile B) Make any legal decisions C) Drink any alcoholic beverage   2) You may resume regular meals tomorrow.  Today it is better to start with liquids and gradually work up to solid foods.  You may eat anything you prefer, but it is better to start with liquids, then soup and crackers, and gradually work up to solid foods.   3) Please notify your doctor immediately if you have any unusual bleeding, trouble breathing, redness and pain at the surgery site, drainage, fever, or pain not relieved by medication.   4) Additional Instructions:   Hospital number [phone]

## 2020-07-29 NOTE — Anesthesia Postprocedure Evaluation (Signed)
Anesthesia Post Note  Patient: Linda Vega  Procedure(s) Performed: BREAST LUMPECTOMY WITH RADIOFREQUENCY TAG IDENTIFICATION, lumpectomy x 2 (Left )  Patient location during evaluation: PACU Anesthesia Type: General Level of consciousness: awake and alert Pain management: pain level controlled Vital Signs Assessment: post-procedure vital signs reviewed and stable Respiratory status: spontaneous breathing, nonlabored ventilation, respiratory function stable and patient connected to nasal cannula oxygen Cardiovascular status: blood pressure returned to baseline and stable Postop Assessment: no apparent nausea or vomiting Anesthetic complications: no   No complications documented.   Last Vitals:  Vitals:   07/29/20 1015 07/29/20 1049  BP: 121/75 131/83  Pulse: 85 89  Resp: 16 16  Temp: 36.6 C 36.7 C  SpO2: 97% 99%    Last Pain:  Vitals:   07/29/20 1049  TempSrc: Temporal  PainSc: 0-No pain                 Martha Clan

## 2020-07-29 NOTE — Anesthesia Preprocedure Evaluation (Signed)
Anesthesia Evaluation  Patient identified by MRN, date of birth, ID band Patient awake    Reviewed: Allergy & Precautions, NPO status , Patient's Chart, lab work & pertinent test results  History of Anesthesia Complications Negative for: history of anesthetic complications  Airway Mallampati: I  TM Distance: >3 FB     Dental  (+) Teeth Intact, Dental Advidsory Given, Caps   Pulmonary neg pulmonary ROS,    Pulmonary exam normal        Cardiovascular Exercise Tolerance: Good hypertension, Pt. on medications (-) angina(-) Past MI and (-) Cardiac Stents Normal cardiovascular exam(-) dysrhythmias (-) Valvular Problems/Murmurs     Neuro/Psych  Headaches, neg Seizures PSYCHIATRIC DISORDERS Anxiety  Neuromuscular disease    GI/Hepatic Neg liver ROS, GERD  ,  Endo/Other  negative endocrine ROS  Renal/GU negative Renal ROS  Female GU complaint     Musculoskeletal  (+) Arthritis , Osteoarthritis,    Abdominal (+) + obese,   Peds negative pediatric ROS (+)  Hematology  (+) Blood dyscrasia, anemia ,   Anesthesia Other Findings Past Medical History: No date: Anxiety 12/2019: BRCA negative     Comment:  MyRisk neg except AXIN2 VUS 12/2019: Family history of breast cancer     Comment:  IBIS=16.7% No date: GERD (gastroesophageal reflux disease) No date: Headache     Comment:  h/o migraines No date: Hypertension   Reproductive/Obstetrics                             Anesthesia Physical  Anesthesia Plan  ASA: II  Anesthesia Plan: General   Post-op Pain Management:    Induction: Intravenous  PONV Risk Score and Plan: 3 and Ondansetron, Dexamethasone, Midazolam, Promethazine and Treatment may vary due to age or medical condition  Airway Management Planned: LMA and Oral ETT  Additional Equipment:   Intra-op Plan:   Post-operative Plan: Extubation in OR  Informed Consent: I have  reviewed the patients History and Physical, chart, labs and discussed the procedure including the risks, benefits and alternatives for the proposed anesthesia with the patient or authorized representative who has indicated his/her understanding and acceptance.     Dental advisory given  Plan Discussed with: CRNA and Surgeon  Anesthesia Plan Comments:         Anesthesia Quick Evaluation

## 2020-07-29 NOTE — Op Note (Signed)
°  Procedure Date:  07/29/2020  Pre-operative Diagnosis:  Left breast fibroadenomas  Post-operative Diagnosis:  Left breast fibroadenomas  Procedure:  Left breast RF tag-localized lumpectomy x 2  Surgeon:  Melvyn Neth, MD  Anesthesia:  General endotracheal  Estimated Blood Loss:  10 ml  Specimens: 1.  Left breast 3 o'clock mass 2.  Left breast 5 o'clock mass  Complications:  None  Indications for Procedure:  This is a 44 y.o. female who presents with two fibroadenomas of the left breast, with some complicating features.  After discussion with the patient, she wishes to have them excised.  The risks of bleeding, infection, injury to surrounding structures, hematoma, seroma, open wound, cosmetic deformity, and the need for further surgery were all discussed with the patient and was willing to proceed.  Prior to this procedure, the patient had undergone RF tag localization.  Description of Procedure: The patient was correctly identified in the preoperative area and brought into the operating room.  The patient was placed supine with VTE prophylaxis in place.  Appropriate time-outs were performed.  Anesthesia was induced and the patient was intubated.  Appropriate antibiotics were infused.  The left chest was prepped and draped in usual sterile fashion.  The RF tag localization sites were determined using the Hologic probe.  We started with the 3 o'clock mass.  An incision was made overlying the tag and clip, incorporating the patient's small incision for the tag placement.  Hologic probe was used to guide our dissection using electrocautery, and a partial mastectomy was performed with adequate margins.  MarginMarker was used to ink each of the sides of the specimen.  The specimen was then imaged to confirm that the area of concern, biopsy clip, and RF tag were included in the excision.  This was then sent to pathology.  We then proceeded in the same fashion to the 5 o'clock mass, imaging  and marking the specimen the same way.  Both cavities were irrigated and hemostasis was assured with electrocautery.  Local anesthetic was infiltrated into the skin and subcutaneous tissue of the cavity.  The wounds were then closed in two layers with 3-0 Vicryl and 4-0 Monocryl and sealed with DermaBond.  The patient was emerged from anesthesia and extubated and brought to the recovery room for further management.  The patient tolerated the procedure well and all counts were correct at the end of the case.   Melvyn Neth, MD

## 2020-07-30 ENCOUNTER — Encounter: Payer: Self-pay | Admitting: Surgery

## 2020-08-02 LAB — SURGICAL PATHOLOGY

## 2020-08-04 ENCOUNTER — Other Ambulatory Visit: Payer: Self-pay

## 2020-08-04 ENCOUNTER — Ambulatory Visit (INDEPENDENT_AMBULATORY_CARE_PROVIDER_SITE_OTHER): Payer: 59 | Admitting: Family Medicine

## 2020-08-04 ENCOUNTER — Encounter: Payer: Self-pay | Admitting: Family Medicine

## 2020-08-04 VITALS — BP 143/85 | HR 102 | Temp 97.1°F | Ht 67.0 in | Wt 240.0 lb

## 2020-08-04 DIAGNOSIS — M65331 Trigger finger, right middle finger: Secondary | ICD-10-CM

## 2020-08-04 DIAGNOSIS — G8929 Other chronic pain: Secondary | ICD-10-CM | POA: Diagnosis not present

## 2020-08-04 DIAGNOSIS — M25531 Pain in right wrist: Secondary | ICD-10-CM

## 2020-08-04 NOTE — Patient Instructions (Addendum)
Thank you for coming to the office today.  Referral to Darral Dash  Use diclofenac voltaren gel and wrist splint / support   Please schedule a Follow-up Appointment to: Return if symptoms worsen or fail to improve.  If you have any other questions or concerns, please feel free to call the office or send a message through [place]. You may also schedule an earlier appointment if necessary.  Additionally, you may be receiving a survey about your experience at our office within a few days to 1 week by e-mail or mail. We value your feedback.  [name], DO [facility], [facility] Trigger Finger  Trigger finger, also called stenosing tenosynovitis,  is a condition that causes a finger to get stuck in a bent position. Each finger has a tendon, which is a tough, cord-like tissue that connects muscle to bone, and each tendon passes through a tunnel of tissue called a tendon sheath. To move your finger, your tendon needs to glide freely through the sheath. Trigger finger happens when the tendon or the sheath thickens, making it difficult to move your finger. Trigger finger can affect any finger or a thumb. It may affect more than one finger. Mild cases may clear up with rest and medicine. Severe cases require more treatment. What are the causes? Trigger finger is caused by a thickened finger tendon or tendon sheath. The cause of this thickening is not known. What increases the risk? The following factors may make you more likely to develop this condition:  Doing activities that require a strong grip.  Having rheumatoid arthritis, gout, or diabetes.  Being 54-20 years old.  Being female. What are the signs or symptoms? Symptoms of this condition include:  Pain when bending or straightening your finger.  Tenderness or swelling where your finger attaches to the palm of your hand.  A lump in the palm of your hand or on the inside of your finger.  Hearing a  noise like a pop or a snap when you try to straighten your finger.  Feeling a catching or locking sensation when you try to straighten your finger.  Being unable to straighten your finger. How is this diagnosed? This condition is diagnosed based on your symptoms and a physical exam. How is this treated? This condition may be treated by:  Resting your finger and avoiding activities that make symptoms worse.  Wearing a finger splint to keep your finger extended.  Taking NSAIDs, such as ibuprofen, to relieve pain and swelling.  Doing gentle exercises to stretch the finger as told by your health care provider.  Having medicine that reduces swelling and inflammation (steroids) injected into the tendon sheath. Injections may need to be repeated.  Having surgery to open the tendon sheath. This may be done if other treatments do not work and you cannot straighten your finger. You may need physical therapy after surgery. Follow these instructions at home: If you have a splint:  Wear the splint as told by your health care provider. Remove it only as told by your health care provider.  Loosen it if your fingers tingle, become numb, or turn cold and blue.  Keep it clean.  If the splint is not waterproof: ? Do not let it get wet. ? Cover it with a watertight covering when you take a bath or shower. Managing pain, stiffness, and swelling     If directed, apply heat to the affected area as often as told by your health care provider.  Use the heat source that your health care provider recommends, such as a moist heat pack or a heating pad.  Place a towel between your skin and the heat source.  Leave the heat on for 20-30 minutes.  Remove the heat if your skin turns bright red. This is especially important if you are unable to feel pain, heat, or cold. You may have a greater risk of getting burned. If directed, put ice on the painful area. To do this:  If you have a removable splint,  remove it as told by your health care provider.  Put ice in a plastic bag.  Place a towel between your skin and the bag or between your splint and the bag.  Leave the ice on for 20 minutes, 2-3 times a day.  Activity  Rest your finger as told by your health care provider. Avoid activities that make the pain worse.  Return to your normal activities as told by your health care provider. Ask your health care provider what activities are safe for you.  Do exercises as told by your health care provider.  Ask your health care provider when it is safe to drive if you have a splint on your hand. General instructions  Take over-the-counter and prescription medicines only as told by your health care provider.  Keep all follow-up visits as told by your health care provider. This is important. Contact a health care provider if:  Your symptoms are not improving with home care. Summary  Trigger finger, also called stenosing tenosynovitis, causes your finger to get stuck in a bent position. This can make it difficult and painful to straighten your finger.  This condition develops when a finger tendon or tendon sheath thickens.  Treatment may include resting your finger, wearing a splint, and taking medicines.  In severe cases, surgery to open the tendon sheath may be needed. This information is not intended to replace advice given to you by your health care provider. Make sure you discuss any questions you have with your health care provider. Document Revised: 12/02/2018 Document Reviewed: 12/02/2018 Elsevier Patient Education  [place].

## 2020-08-04 NOTE — Progress Notes (Signed)
Subjective:    Patient ID: Linda Vega, female    DOB: [birth year], 45 y.o.   MRN: [id number]  Linda Vega is a 45 y.o. female presenting on 08/04/2020 for Wrist Pain (Right side onset 2 month but getting worst from last week-dull achy continuous pain)   HPI   Right Wrist Pain, dorsal Trigger Finger middle R side Reports few months onset with episode of R wrist aching pain on dorsal R wrist worse if wrist flexed pushing up out of bed, other activities usually do not flare it up during day with normal tasks such as writing, typing etc. Admits stiffness in 1st 1-2 fingers - with some "sticking" or triggering at times R middle finger. Using topical Voltaren NSAID PRN temporary relief. Tried Ibuprofen / OTC acetaminophen nsaid with limited relief Patient is Right handed. Denies any numbness tingling, weakness  Health Maintenance: UTD COVID vaccine with 2nd dose Pfizer 03/18/20. Not yet due for booster yet.  Past Surgical History:  Procedure Laterality Date  . ABDOMINAL HYSTERECTOMY    . BREAST BIOPSY Right 11/23/2017   US guided breast biopsy of 2:00 mass, coil marker, cellular fibroadenoma  . BREAST BIOPSY Left 07/05/2020   [place] Bx, 3:00, Heart Clip   RF tag [id number]  . BREAST BIOPSY Left 07/05/2020   [place] Bx, 5:00, X Clip  RF tag [id number]  . BREAST CYST ASPIRATION Left    neg  . BREAST LUMPECTOMY WITH RADIOFREQUENCY TAG IDENTIFICATION Left 70/62/3762   Procedure: BREAST LUMPECTOMY WITH RADIOFREQUENCY TAG IDENTIFICATION, lumpectomy x 2;  Surgeon: Olean Ree, MD;  Location: [facility];  Service: General;  Laterality: Left;  . CESAREAN SECTION    . CYSTOSCOPY N/A 10/14/2019   Procedure: CYSTOSCOPY;  Surgeon: Homero Fellers, MD;  Location: [facility];  Service: Gynecology;  Laterality: N/A;  . fibrocystic breast    . IUD REMOVAL N/A 10/14/2019   Procedure: INTRAUTERINE DEVICE (IUD) REMOVAL;  Surgeon: Homero Fellers, MD;  Location: [facility];  Service: Gynecology;  Laterality:  N/A;  . LAPAROSCOPY FOR ECTOPIC PREGNANCY    . TOTAL LAPAROSCOPIC HYSTERECTOMY WITH SALPINGECTOMY Right 10/14/2019   Procedure: TOTAL LAPAROSCOPIC HYSTERECTOMY WITH RIGHT SALPINGECTOMY;  Surgeon: Homero Fellers, MD;  Location: [facility];  Service: Gynecology;  Laterality: Right;     Depression screen [facility] 2/9 01/14/2020 09/12/2019 07/16/2019  Decreased Interest 0 0 0  Down, Depressed, Hopeless 0 0 0  PHQ - 2 Score 0 0 0  Altered sleeping - 0 -  Tired, decreased energy - 0 -  Change in appetite - 0 -  Feeling bad or failure about yourself  - 0 -  Trouble concentrating - 1 -  Moving slowly or fidgety/restless - 0 -  Suicidal thoughts - 0 -  PHQ-9 Score - 1 -  Difficult doing work/chores - Very difficult -    Social History   Tobacco Use  . Smoking status: Never Smoker  . Smokeless tobacco: Never Used  Vaping Use  . Vaping Use: Never used  Substance Use Topics  . Alcohol use: Yes    Comment: occasional, about 3x a month  . Drug use: No    Review of Systems Per HPI unless specifically indicated above     Objective:    BP (!) 143/85   Pulse (!) 102   Temp (!) 97.1 F (36.2 C) (Temporal)   Ht 5' 7"  (1.702 m)   Wt 240 lb (108.9 kg)   LMP 10/04/2019 (Exact Date)   SpO2  99%   BMI 37.59 kg/m   Wt Readings from Last 3 Encounters:  08/04/20 240 lb (108.9 kg)  07/29/20 240 lb 1.3 oz (108.9 kg)  07/16/20 240 lb (108.9 kg)    Physical Exam Vitals and nursing note reviewed.  Constitutional:      General: She is not in acute distress.    Appearance: She is well-developed and well-nourished. She is not diaphoretic.     Comments: Well-appearing, comfortable, cooperative  HENT:     Head: Normocephalic and atraumatic.     Mouth/Throat:     Mouth: Oropharynx is clear and moist.  Eyes:     General:        Right eye: No discharge.        Left eye: No discharge.     Conjunctiva/sclera: Conjunctivae normal.  Cardiovascular:     Rate and Rhythm: Normal rate.   Pulmonary:     Effort: Pulmonary effort is normal.  Musculoskeletal:        General: No edema.     Comments: Right Hand/Wrist Inspection: Normal appearance, symmetrical, no bulky MCP joints, no edema or erythema. Palpation: Mild tender R wrist dorsal middle of wrist over tendons. Otherwise non tender carpal bones, including MCP, base of thumb. No distinct anatomical snuff box or scaphoid tenderness.  No tenderness over APL / EPB tendons radially. ROM: full active wrist ROM flex / ext, ulnar / radial deviation, non tender. - Palpable popping clicking palmar middle finger at base of digit with triggering Special Testing: Negative Finkelstein's test. Negative carpal tunnel tinel Strength: 5/5 grip, thumb opposition, wrist flex/ext Neurovascular: distally intact   Skin:    General: Skin is warm and dry.     Findings: No erythema or rash.  Neurological:     Mental Status: She is alert and oriented to person, place, and time.  Psychiatric:        Mood and Affect: Mood and affect normal.        Behavior: Behavior normal.     Comments: Well groomed, good eye contact, normal speech and thoughts       Results for orders placed or performed during the hospital encounter of 07/29/20  Surgical pathology  Result Value Ref Range   SURGICAL PATHOLOGY      SURGICAL PATHOLOGY CASE: [id number] PATIENT: [facility] Surgical Pathology Report     Specimen Submitted: A. Left breast mass, 3:00 B. Left breast mass, 5:00  Clinical History: Left breast fibroadenomas x2    DIAGNOSIS: A. BREAST, LEFT AT 3:00; EXCISION: - BENIGN FIBROADENOMA, FOCALLY PRESENT AT INKED SUPERIOR/MEDIAL TISSUE EDGE. - FOCAL ADJACENT ATYPICAL LOBULAR HYPERPLASIA. - CLIP, RF TAG, AND BIOPSY SITE IDENTIFIED. - NEGATIVE FOR CARCINOMA IN SITU AND MALIGNANCY.  B. BREAST, LEFT AT 5:00; EXCISION: - COMPLEX FIBROADENOMA, FOCALLY PRESENT AT INKED MEDIAL TISSUE EDGE. - BACKGROUND BENIGN MAMMARY TISSUE WITH  FIBROCYSTIC AND FIBROADENOMATOID CHANGES. - CLIP, RF TAG, AND BIOPSY SITE IDENTIFIED. - NEGATIVE FOR ATYPICAL PROLIFERATIVE BREAST DISEASE.  GROSS DESCRIPTION: A. Labeled: 3:00 left breast mass Received: Fresh Specimen radiograph image(s) available for review Radiographic findings: A heart-shaped clip and RF ID tag are present. Time in fi xative: Collected at 8:14 AM on 07/29/2020 and placed in formalin at 8:32 AM on 07/29/2020 Cold ischemic time: Less than 30 minutes Total fixation time: 12.5 hours Type of procedure: Breast lumpectomy Location / laterality of specimen: Left breast 3:00 Orientation of specimen: The specimen is received inked. Inking: Anterior = green Inferior = blue Lateral = orange  Medial = yellow Posterior = black Superior = red Size of specimen: 5.1 (medial to lateral) x 3.6 (superior to inferior) x 2.3 (anterior to posterior) cm Skin: None grossly appreciated.  Biopsy site: There is a biopsy site which contains a heart-shaped clip.  Number of discrete masses: 1 Size of mass(es): 1.1 x 0.9 x 0.8 cm Description of mass(es): The mass is tan-pink, firm, and well-circumscribed. Distance between masses/clips: The clip is located at the periphery of the mass and the radiotracer is located adjacent to the mass. Margins: Superior = less than 0.1 cm, inferior =1.2 cm, posterior =0.1  cm, anterior =0.7 cm, medial = less than 0.1 cm, lateral =4 cm Description of remainder of tissue: The remainder of the specimen is comprised of tan-white firm fibrous tissue and yellow lobulated adipose tissue.  No distinct masses or lesions are grossly appreciated.  The fat to fibrous tissue ratio is 70:30.  Block summary (approximately 80% of the mass is submitted): 1 - 3 - medial margin, perpendicularly sectioned      2 - 3 - mass with closest approach to medial margin 4 - 5 - representative sections of mass      4 - closest approach to superior and posterior margins      5  - clip site and closest approach to superior, posterior, and anterior margins 6 - closest inferior margins to mass 7 - additional closest anterior margin to mass 8 - closest lateral margin to mass 9 - 10 - lateral margin, perpendicularly sectioned  B. Labeled: 7:00 left breast mass (per Dr. Hampton Abbot the site is 5:00 left breast mass) Received: Fresh Specimen radiograph image(s) available fo r review Radiographic findings: An X clip and RF ID tag are present. Time in fixative: Collected at 8:38 AM on 07/29/2020 and placed in formalin at 9:07 AM on 07/29/2020 Cold ischemic time: Less than 30 minutes Total fixation time: 12 hours Type of procedure: Breast lumpectomy Location / laterality of specimen: Left breast, 5:00 Orientation of specimen: The specimen is received inked. Inking: Anterior = green Inferior = blue Lateral = orange Medial = yellow Posterior = black Superior = red Size of specimen: 4.4 (superior to inferior) x 2.6 cm (medial to lateral) x 1.7 cm (anterior to posterior) cm Skin: There is a small ellipse of skin on the anterior inferior aspect, 1.6 x 0.2 cm.  The skin is tan-brown, smooth, and grossly unremarkable.  Biopsy site: There is a biopsy site which contains an X shaped clip.  Number of discrete masses: There is a solid mass and a separate cyst. Size of mass(es): Mass: 1.1 x 0.7 x 0.5 cm; cyst: 0.7 x 0.5 x 0.5 cm Description of m ass(es): The mass is tan-white, firm, and well-circumscribed.  The cyst has tan-white and smooth walls which range from 0.1 to 0.3 cm in thickness.  The cyst is 0.9 cm anterior and medial to the mass. Distance between masses/clips: The clip site is approximately 1 cm anterior medial to the identified mass and is within the cyst. Margins: Mass: Anterior =0.5 cm, posterior =0.9 cm, medial =0.7 cm, lateral = less than 0.1 cm, superior =0.9 cm, inferior =0.5 cm, skin =0.7 cm; cyst: Anterior =0.2 cm, posterior =0.7 cm, medial = less  than 0.1 cm, lateral =0.9 cm, superior =1.4 cm, inferior =0.7 cm, skin =1.6 cm Description of remainder of tissue: The remainder of the specimen is comprised of yellow lobulated adipose tissue admixed with tan-white firm fibrous tissue.  The fat to  fibrous tissue ratio is 80:20.  Block summary (the entirety of the mass and cyst are submitted): 1 - 2 - superior margin, perpendicularly sectioned 3 - closest superior margin to mass and cyst 4 - 5- mass, submitted entirely      4 - closest approach to lateral, medial, anterior, and posterior margins      5 - closest approach to anterior and lateral margins 6 - representative grossly normal breast tissue between mass and cyst 7 - 8 - cyst and clip site, submitted entirely with closest approach to anterior, posterior, lateral, medial, and inferior margins 9 - additional closest inferior margin to mass and cyst with closest portion of skin 10 - 11 - inferior margin-remaining skin, perpendicularly sectioned    Final Diagnosis performed by Allena Napoleon, MD.   Electronically signed 08/02/2020 11:50:45AM The electronic signature indicates that the named Attending Pathologist has evaluated the specimen Technical component performed at [facility], [address] [place], [place] [address] Lab: [phone] Dir: Rush Farmer, MD, MMM  Professional component performed at [facility], [facility], [place], [place], [place] [address] Lab: [phone] Dir : Dellia Nims. Rubinas, MD       Assessment & Plan:   Problem List Items Addressed This Visit   None   Visit Diagnoses    Chronic pain of right wrist    -  Primary   Relevant Orders   Ambulatory referral to Orthopedic Surgery   Trigger middle finger of right hand       Relevant Orders   Ambulatory referral to Orthopedic Surgery      Clinically with localized R wrist pain dorsal >2-3 months, chronic issue now seems to be functional likely tendonitis related based on  activities provoked. No evidence trauma or other injury. - Also has R middle trigger finger, mild - not limiting function.  Repeat trial of conservative care with R wrist immobilization and limiting overuse, splinting support, topical NSAID voltaren  Referral to [facility] Dash for further evaluation - may warrant x-ray or other testing / treatment if unresolved.   Orders Placed This Encounter  Procedures  . Ambulatory referral to Orthopedic Surgery    Referral Priority:   Routine    Referral Type:   Surgical    Referral Reason:   Specialty Services Required    Requested Specialty:   Orthopedic Surgery    Number of Visits Requested:   1     No orders of the defined types were placed in this encounter.     Follow up plan: Return if symptoms worsen or fail to improve.   [name], [facility] 08/04/2020, 3:54 PM

## 2020-08-06 ENCOUNTER — Other Ambulatory Visit: Payer: Self-pay | Admitting: Family Medicine

## 2020-08-06 ENCOUNTER — Encounter: Payer: Self-pay | Admitting: Family Medicine

## 2020-08-06 ENCOUNTER — Telehealth (INDEPENDENT_AMBULATORY_CARE_PROVIDER_SITE_OTHER): Payer: 59 | Admitting: Family Medicine

## 2020-08-06 ENCOUNTER — Other Ambulatory Visit: Payer: Self-pay

## 2020-08-06 DIAGNOSIS — Z20822 Contact with and (suspected) exposure to covid-19: Secondary | ICD-10-CM

## 2020-08-06 MED ORDER — IPRATROPIUM BROMIDE 0.06 % NA SOLN: 2.0000 | Freq: Four times a day (QID) | NASAL | 0 refills | Status: DC

## 2020-08-06 MED ORDER — BENZONATATE 100 MG PO CAPS: 100.0000 mg | ORAL_CAPSULE | Freq: Three times a day (TID) | ORAL | 0 refills | Status: DC | PRN

## 2020-08-06 MED ORDER — ALBUTEROL SULFATE HFA 108 (90 BASE) MCG/ACT IN AERS: 2.0000 | INHALATION_SPRAY | RESPIRATORY_TRACT | 0 refills | Status: DC | PRN

## 2020-08-06 NOTE — Patient Instructions (Addendum)
Start Atrovent nasal spray decongestant 2 sprays in each nostril up to 4 times daily for 7 days Start Tessalon Perls take 1 capsule up to 3 times a day as needed for cough Albuterol PRN 2 puffs every 4-6 hours for cough or wheezing.  COVID testing tomorrow  Quarantine guidelines reviewed pending result. Out 5 days from positive test if symptoms improve fever free, resp symptoms improve. If worse or not resolving respiratory symptoms may be out 10 days from positive.   Please schedule a Follow-up Appointment to: Return if symptoms worsen or fail to improve, for suspected covid.  If you have any other questions or concerns, please feel free to call the office or send a message through [facility]. You may also schedule an earlier appointment if necessary.  Additionally, you may be receiving a survey about your experience at our office within a few days to 1 week by e-mail or mail. We value your feedback.  [name], DO [place]

## 2020-08-06 NOTE — Progress Notes (Signed)
Virtual Visit via Telephone The purpose of this virtual visit is to provide medical care while limiting exposure to the novel coronavirus (COVID19) for both patient and office staff.  Consent was obtained for phone visit:  Yes.   Answered questions that patient had about telehealth interaction:  Yes.   I discussed the limitations, risks, security and privacy concerns of performing an evaluation and management service by telephone. I also discussed with the patient that there may be a patient responsible charge related to this service. The patient expressed understanding and agreed to proceed.  Patient Location: Home Provider Location: [facility] (Office)  Participants in virtual visit: - Patient: Linda Vega - CMA: Frederich Cha, CMA - Provider: Dr Parks Ranger  ---------------------------------------------------------------------- Chief Complaint  Patient presents with  . Fever    HA, low grade fever, wheezing yesterday but denies SOB, nasal drainage and cough onset yesterday     S: Reviewed CMA documentation. I have called patient and gathered additional HPI as follows:  Fever / Headache / Wheezing Reports that symptoms started yesterday felt acute onset low grade fever, headache, wheezing cough congestion drainage sore throat. - Took temperature 99.61F and temp did improve with Tylenol. - Admits nasal drip and drainage, mucus  Denies any known or suspected exposure to person with or possibly with COVID19.  Denies diarrhea abdominal pain nausea vomiting   Past Medical History:  Diagnosis Date  . Anxiety   . BRCA negative 12/2019   MyRisk neg except AXIN2 VUS  . Family history of breast cancer 12/2019   IBIS=16.7%  . GERD (gastroesophageal reflux disease)   . Headache    h/o migraines  . Hypertension    Social History   Tobacco Use  . Smoking status: Never Smoker  . Smokeless tobacco: Never Used  Vaping Use  . Vaping Use: Never used   Substance Use Topics  . Alcohol use: Yes    Comment: occasional, about 3x a month  . Drug use: No    Current Outpatient Medications:  .  albuterol (VENTOLIN HFA) 108 (90 Base) MCG/ACT inhaler, Inhale 2 puffs into the lungs every 4 (four) hours as needed for wheezing or shortness of breath (cough)., Disp: 1 each, Rfl: 0 .  benzonatate (TESSALON) 100 MG capsule, Take 1 capsule (100 mg total) by mouth 3 (three) times daily as needed for cough., Disp: 30 capsule, Rfl: 0 .  ipratropium (ATROVENT) 0.06 % nasal spray, Place 2 sprays into both nostrils 4 (four) times daily. For up to 5-7 days then stop., Disp: 15 mL, Rfl: 0 .  acetaminophen (TYLENOL) 500 MG tablet, Take 2 tablets (1,000 mg total) by mouth every 6 (six) hours as needed for mild pain., Disp: , Rfl:  .  amLODipine (NORVASC) 10 MG tablet, TAKE 1 TABLET BY MOUTH DAILY. (Patient taking differently: Take 10 mg by mouth every morning.), Disp: 90 tablet, Rfl: 3 .  AUVI-Q 0.3 MG/0.3ML SOAJ injection, Inject 0.3 mg into the muscle as needed for anaphylaxis. , Disp: , Rfl:  .  B Complex-C-Folic Acid (STRESS FORMULA PO), Take 1 tablet by mouth daily. GoodBye Stress by OLLY, Disp: , Rfl:  .  baclofen (LIORESAL) 10 MG tablet, Take 0.5-1 tablets (5-10 mg total) by mouth 3 (three) times daily as needed for muscle spasms., Disp: 30 each, Rfl: 1 .  chlorhexidine (PERIDEX) 0.12 % solution, SMARTSIG:By Mouth, Disp: , Rfl:  .  Cholecalciferol (VITAMIN D3) 125 MCG (5000 UT) CAPS, Take 5,000 Units by mouth every other  day., Disp: , Rfl:  .  fexofenadine (ALLEGRA) 180 MG tablet, Take 180 mg by mouth daily as needed for allergies or rhinitis., Disp: , Rfl:  .  fluticasone (FLONASE) 50 MCG/ACT nasal spray, Place 2 sprays into both nostrils daily. Use for 4-6 weeks then stop and use seasonally or as needed. (Patient taking differently: Place 2 sprays into both nostrils daily as needed for allergies. Use for 4-6 weeks then stop and use seasonally or as needed.),  Disp: 16 g, Rfl: 3 .  gabapentin (NEURONTIN) 100 MG capsule, Start 1 capsule daily, increase by 1 cap every 2-3 days as tolerated up to 3 times a day, or may take 3 at once in evening. (Patient taking differently: Take 100-300 mg by mouth See admin instructions. Start 1 capsule daily, increase by 1 cap every 2-3 days as tolerated up to 3 times a day, or may take 3 at once in evening.PRN), Disp: 90 capsule, Rfl: 1 .  ibuprofen (ADVIL) 600 MG tablet, Take 1 tablet (600 mg total) by mouth every 6 (six) hours as needed. (Patient taking differently: Take 600 mg by mouth every 6 (six) hours as needed for moderate pain.), Disp: 60 tablet, Rfl: 1 .  ibuprofen (ADVIL) 800 MG tablet, Take 1 tablet (800 mg total) by mouth every 8 (eight) hours as needed for moderate pain., Disp: 60 tablet, Rfl: 1 .  ketoconazole (NIZORAL) 2 % cream, Apply 1 application topically 2 (two) times daily as needed (skin irritation/rash.). , Disp: , Rfl: 1 .  omeprazole (PRILOSEC) 20 MG capsule, TAKE 1 CAPSULE (20 MG TOTAL) BY MOUTH DAILY BEFORE BREAKFAST. (Patient taking differently: Take 20 mg by mouth daily as needed (acid reflux).), Disp: 90 capsule, Rfl: 1 .  OVER THE COUNTER MEDICATION, Take 3 tablets by mouth daily. Dairy Relief, Disp: , Rfl:  .  OVER THE COUNTER MEDICATION, Take 2 each by mouth daily. Apple Cider Vinegar  Goli, Disp: , Rfl:  .  oxyCODONE (OXY IR/ROXICODONE) 5 MG immediate release tablet, Take 1 tablet (5 mg total) by mouth every 4 (four) hours as needed for severe pain., Disp: 30 tablet, Rfl: 0 .  valACYclovir (VALTREX) 500 MG tablet, TAKE 1 TABLET BY MOUTH 2 TIMES A DAILY FOR 3 TO 5 DAYS AS NEEDED FOR FLARE (Patient taking differently: Take 500 mg by mouth 2 (two) times daily as needed (outbreak).), Disp: 30 tablet, Rfl: 4  Depression screen [facility] 2/9 01/14/2020 09/12/2019 07/16/2019  Decreased Interest 0 0 0  Down, Depressed, Hopeless 0 0 0  PHQ - 2 Score 0 0 0  Altered sleeping - 0 -  Tired, decreased energy -  0 -  Change in appetite - 0 -  Feeling bad or failure about yourself  - 0 -  Trouble concentrating - 1 -  Moving slowly or fidgety/restless - 0 -  Suicidal thoughts - 0 -  PHQ-9 Score - 1 -  Difficult doing work/chores - Very difficult -    GAD 7 : Generalized Anxiety Score 01/14/2020 09/12/2019 08/03/2017  Nervous, Anxious, on Edge 0 2 3  Control/stop worrying 0 2 2  Worry too much - different things _0 Trouble relaxing 0 0 0  Restless 0 0 0  Easily annoyed or irritable _1 Afraid - awful might happen 0 1 2  Total GAD 7 Score _2 Anxiety Difficulty Not difficult at all Somewhat difficult Very difficult    -------------------------------------------------------------------------- O: No physical exam performed due  to remote telephone encounter.  Lab results reviewed.  Recent Results (from the past 2160 hour(s))  Surgical pathology     Status: None   Collection Time: 07/05/20  9:29 AM  Result Value Ref Range   SURGICAL PATHOLOGY      SURGICAL PATHOLOGY CASE: [id number] PATIENT: [facility] Surgical Pathology Report     Specimen Submitted: A. Breast, left, 3:00 5 cm from nipple; [place] bx B. Breast, left, 5:00 8 cm from nipple; [place] bx  Clinical History: Strong maternal family history of breast carcinoma. Two left breast masses are biopsied; multiple additional smaller masses are present. 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 1. Left breast at 3 o'clock 5 cm from nipple: Solid oval mass, 1.2 cm, possible FA, papilloma, rule out atypia and malignancy, heart-shaped clip placed. 2. Left breast at 5 o'clock 8 cm from nipple: Complex cystic and solid mass, 1.3 cm, ? papillary, X-shaped clip placed. Post-biopsy mammograms show appropriate positioning of the heart-shaped biopsy marking clip at the site of biopsy in the left breast at 3 o'clock, and appropriate positioning of the X-shaped biopsy marking clip at the site of  biopsy in the left breast at 5 o'cl ock.     DIAGNOSIS: A. BREAST, LEFT, 3 O'CLOCK 5 CM FROM NIPPLE (HEART-SHAPED CLIP); ULTRASOUND-GUIDED CORE BIOPSY: - FIBROADENOMA. - FOCAL ATYPICAL LOBULAR HYPERPLASIA ([place]) IS PRESENT WITHIN THE FIBROADENOMA. - NO IN SITU OR INVASIVE CARCINOMA IS IDENTIFIED IN THIS SAMPLE.  B. BREAST, LEFT, 5 O'CLOCK 8 CM FROM NIPPLE (X-SHAPED CLIP); ULTRASOUND-GUIDED CORE BIOPSY: - COMPLEX FIBROADENOMA WITH CYSTIC CHANGE. - NEGATIVE FOR ATYPIA AND MALIGNANCY IN THIS SAMPLE.  GROSS DESCRIPTION: A. Labeled: Left breast 3:00 5 cm from nipple Received: Formalin Time/date in fixative: Collected and placed in formalin at 8:30 AM on 07/05/2020 Cold ischemic time: Less than 1 minute Total fixation time: 8.5 hours Core pieces: 4 Size: Range from 1-1.1 cm in length and 0.2 cm in diameter Description: Received are cores of tan-white fibrofatty tissue. Ink color: Blue Entirely submitted in cassettes 1-2 with 2 cores per cassette.  B. Labeled: Left breast 5:00 8 cm from nipple Rece ived: Formalin Time/date in fixative: Collected and placed in formalin at 8:42 AM on 07/05/2020 Cold ischemic time: Less than 1 minute Total fixation time: 8.5 hours Core pieces: 4 Size: Range from 0.6-1.4 cm in length and 0.2 cm in diameter Description: Received are cores of yellow and pink fibrofatty tissue. Ink color: Black Entirely submitted in cassettes 1-2 with 2 cores per cassette.   Final Diagnosis performed by Bryan Lemma, MD.   Electronically signed 07/06/2020 5:56:28PM The electronic signature indicates that the named Attending Pathologist has evaluated the specimen Technical component performed at [facility], [address], [place], [place] [address] Lab: [phone] Dir: Rush Farmer, MD, MMM  Professional component performed at [facility], [facility], [place], [place], [place] [address] Lab: [phone] Dir: Dellia Nims. Rubinas, MD   SARS  CORONAVIRUS 2 (TAT 6-24 HRS) Nasopharyngeal Nasopharyngeal Swab     Status: None   Collection Time: 07/27/20  8:42 AM   Specimen: Nasopharyngeal Swab  Result Value Ref Range   SARS Coronavirus 2 NEGATIVE NEGATIVE    Comment: (NOTE) SARS-CoV-2 target nucleic acids are NOT DETECTED.  The SARS-CoV-2 RNA is generally detectable in upper and lower respiratory specimens during the acute phase of infection. Negative results do not preclude SARS-CoV-2 infection, do not rule out co-infections with other pathogens, and should not be used as  the sole basis for treatment or other patient management decisions. Negative results must be combined with clinical observations, patient history, and epidemiological information. The expected result is Negative.  Fact Sheet for Patients: SugarRoll.be  Fact Sheet for Healthcare Providers: https://www.woods-mathews.com/  This test is not yet approved or cleared by the [place] FDA and  has been authorized for detection and/or diagnosis of SARS-CoV-2 by FDA under an Emergency Use Authorization (EUA). This EUA will remain  in effect (meaning this test can be used) for the duration of the COVID-19 declaration under Se ction 564(b)(1) of the Act, 21 U.S.C. section 360bbb-3(b)(1), unless the authorization is terminated or revoked sooner.  Performed at [facility], [place] [address], [place], [place] [address]   Surgical pathology     Status: None   Collection Time: 07/29/20  8:14 AM  Result Value Ref Range   SURGICAL PATHOLOGY      SURGICAL PATHOLOGY CASE: [id number] PATIENT: [facility] Surgical Pathology Report     Specimen Submitted: A. Left breast mass, 3:00 B. Left breast mass, 5:00  Clinical History: Left breast fibroadenomas x2    DIAGNOSIS: A. BREAST, LEFT AT 3:00; EXCISION: - BENIGN FIBROADENOMA, FOCALLY PRESENT AT INKED SUPERIOR/MEDIAL TISSUE EDGE. - FOCAL ADJACENT ATYPICAL  LOBULAR HYPERPLASIA. - CLIP, RF TAG, AND BIOPSY SITE IDENTIFIED. - NEGATIVE FOR CARCINOMA IN SITU AND MALIGNANCY.  B. BREAST, LEFT AT 5:00; EXCISION: - COMPLEX FIBROADENOMA, FOCALLY PRESENT AT INKED MEDIAL TISSUE EDGE. - BACKGROUND BENIGN MAMMARY TISSUE WITH FIBROCYSTIC AND FIBROADENOMATOID CHANGES. - CLIP, RF TAG, AND BIOPSY SITE IDENTIFIED. - NEGATIVE FOR ATYPICAL PROLIFERATIVE BREAST DISEASE.  GROSS DESCRIPTION: A. Labeled: 3:00 left breast mass Received: Fresh Specimen radiograph image(s) available for review Radiographic findings: A heart-shaped clip and RF ID tag are present. Time in fi xative: Collected at 8:14 AM on 07/29/2020 and placed in formalin at 8:32 AM on 07/29/2020 Cold ischemic time: Less than 30 minutes Total fixation time: 12.5 hours Type of procedure: Breast lumpectomy Location / laterality of specimen: Left breast 3:00 Orientation of specimen: The specimen is received inked. Inking: Anterior = green Inferior = blue Lateral = orange Medial = yellow Posterior = black Superior = red Size of specimen: 5.1 (medial to lateral) x 3.6 (superior to inferior) x 2.3 (anterior to posterior) cm Skin: None grossly appreciated.  Biopsy site: There is a biopsy site which contains a heart-shaped clip.  Number of discrete masses: 1 Size of mass(es): 1.1 x 0.9 x 0.8 cm Description of mass(es): The mass is tan-pink, firm, and well-circumscribed. Distance between masses/clips: The clip is located at the periphery of the mass and the radiotracer is located adjacent to the mass. Margins: Superior = less than 0.1 cm, inferior =1.2 cm, posterior =0.1  cm, anterior =0.7 cm, medial = less than 0.1 cm, lateral =4 cm Description of remainder of tissue: The remainder of the specimen is comprised of tan-white firm fibrous tissue and yellow lobulated adipose tissue.  No distinct masses or lesions are grossly appreciated.  The fat to fibrous tissue ratio is 70:30.  Block summary  (approximately 80% of the mass is submitted): 1 - 3 - medial margin, perpendicularly sectioned      2 - 3 - mass with closest approach to medial margin 4 - 5 - representative sections of mass      4 - closest approach to superior and posterior margins      5 - clip site and closest approach to superior, posterior, and anterior margins 6 -  closest inferior margins to mass 7 - additional closest anterior margin to mass 8 - closest lateral margin to mass 9 - 10 - lateral margin, perpendicularly sectioned  B. Labeled: 7:00 left breast mass (per Dr. Hampton Abbot the site is 5:00 left breast mass) Received: Fresh Specimen radiograph image(s) available fo r review Radiographic findings: An X clip and RF ID tag are present. Time in fixative: Collected at 8:38 AM on 07/29/2020 and placed in formalin at 9:07 AM on 07/29/2020 Cold ischemic time: Less than 30 minutes Total fixation time: 12 hours Type of procedure: Breast lumpectomy Location / laterality of specimen: Left breast, 5:00 Orientation of specimen: The specimen is received inked. Inking: Anterior = green Inferior = blue Lateral = orange Medial = yellow Posterior = black Superior = red Size of specimen: 4.4 (superior to inferior) x 2.6 cm (medial to lateral) x 1.7 cm (anterior to posterior) cm Skin: There is a small ellipse of skin on the anterior inferior aspect, 1.6 x 0.2 cm.  The skin is tan-brown, smooth, and grossly unremarkable.  Biopsy site: There is a biopsy site which contains an X shaped clip.  Number of discrete masses: There is a solid mass and a separate cyst. Size of mass(es): Mass: 1.1 x 0.7 x 0.5 cm; cyst: 0.7 x 0.5 x 0.5 cm Description of m ass(es): The mass is tan-white, firm, and well-circumscribed.  The cyst has tan-white and smooth walls which range from 0.1 to 0.3 cm in thickness.  The cyst is 0.9 cm anterior and medial to the mass. Distance between masses/clips: The clip site is approximately 1 cm anterior  medial to the identified mass and is within the cyst. Margins: Mass: Anterior =0.5 cm, posterior =0.9 cm, medial =0.7 cm, lateral = less than 0.1 cm, superior =0.9 cm, inferior =0.5 cm, skin =0.7 cm; cyst: Anterior =0.2 cm, posterior =0.7 cm, medial = less than 0.1 cm, lateral =0.9 cm, superior =1.4 cm, inferior =0.7 cm, skin =1.6 cm Description of remainder of tissue: The remainder of the specimen is comprised of yellow lobulated adipose tissue admixed with tan-white firm fibrous tissue.  The fat to fibrous tissue ratio is 80:20.  Block summary (the entirety of the mass and cyst are submitted): 1 - 2 - superior margin, perpendicularly sectioned 3 - closest superior margin to mass and cyst 4 - 5- mass, submitted entirely      4 - closest approach to lateral, medial, anterior, and posterior margins      5 - closest approach to anterior and lateral margins 6 - representative grossly normal breast tissue between mass and cyst 7 - 8 - cyst and clip site, submitted entirely with closest approach to anterior, posterior, lateral, medial, and inferior margins 9 - additional closest inferior margin to mass and cyst with closest portion of skin 10 - 11 - inferior margin-remaining skin, perpendicularly sectioned    Final Diagnosis performed by Allena Napoleon, MD.   Electronically signed 08/02/2020 11:50:45AM The electronic signature indicates that the named Attending Pathologist has evaluated the specimen Technical component performed at [facility], [address], [place], [place] [address] Lab: [phone] Dir: Rush Farmer, MD, MMM  Professional component performed at [facility], [facility], [place], [place], [place] [address] Lab: [phone] Dir : Dellia Nims. Reuel Derby, MD     -------------------------------------------------------------------------- A&P:  Problem List Items Addressed This Visit   None   Visit Diagnoses    Suspected COVID-19 virus infection    -   Primary  Relevant Medications   albuterol (VENTOLIN HFA) 108 (90 Base) MCG/ACT inhaler   benzonatate (TESSALON) 100 MG capsule   ipratropium (ATROVENT) 0.06 % nasal spray     Clinically with concern for COVID infection, acute onset symptoms within past 24 hours. Patient is fully vaccinated Ruth 2nd dose 03/18/20  Scheduled for COVID testing tomorrow AM already at [facility], await results. Quarantine guidelines reviewed pending result. Out 5 days from positive test if symptoms improve fever free, resp symptoms improve. If worse or not resolving respiratory symptoms may be out 10 days from positive.  If negative covid, can be other viral infection  Start Atrovent nasal spray decongestant 2 sprays in each nostril up to 4 times daily for 7 days Start Tessalon Perls take 1 capsule up to 3 times a day as needed for cough Albuterol PRN  Precautions reviewed, return criteria if needed  Meds ordered this encounter  Medications  . albuterol (VENTOLIN HFA) 108 (90 Base) MCG/ACT inhaler    Sig: Inhale 2 puffs into the lungs every 4 (four) hours as needed for wheezing or shortness of breath (cough).    Dispense:  1 each    Refill:  0  . benzonatate (TESSALON) 100 MG capsule    Sig: Take 1 capsule (100 mg total) by mouth 3 (three) times daily as needed for cough.    Dispense:  30 capsule    Refill:  0  . ipratropium (ATROVENT) 0.06 % nasal spray    Sig: Place 2 sprays into both nostrils 4 (four) times daily. For up to 5-7 days then stop.    Dispense:  15 mL    Refill:  0    Follow-up: - Return as needed  Patient verbalizes understanding with the above medical recommendations including the limitation of remote medical advice.  Specific follow-up and call-back criteria were given for patient to follow-up or seek medical care more urgently if needed.   - Time spent in direct consultation with patient on phone: 7 minutes   [name], [facility] 08/06/2020, 2:22 PM

## 2020-08-07 ENCOUNTER — Other Ambulatory Visit: Payer: Self-pay

## 2020-08-07 ENCOUNTER — Other Ambulatory Visit: Payer: 59

## 2020-08-07 DIAGNOSIS — Z20822 Contact with and (suspected) exposure to covid-19: Secondary | ICD-10-CM

## 2020-08-08 DIAGNOSIS — R059 Cough, unspecified: Secondary | ICD-10-CM

## 2020-08-09 ENCOUNTER — Other Ambulatory Visit: Payer: Self-pay | Admitting: Family Medicine

## 2020-08-09 MED ORDER — GUAIFENESIN-CODEINE 100-10 MG/5ML PO SYRP: 5.0000 mL | ORAL_SOLUTION | Freq: Four times a day (QID) | ORAL | 0 refills | Status: DC | PRN

## 2020-08-09 NOTE — Addendum Note (Signed)
Addended by: Olin Hauser on: 08/09/2020 10:50 AM   Modules accepted: Orders

## 2020-08-10 LAB — NOVEL CORONAVIRUS, NAA: SARS-CoV-2, NAA: NOT DETECTED

## 2020-08-11 ENCOUNTER — Encounter: Payer: 59 | Admitting: Surgery

## 2020-08-18 ENCOUNTER — Encounter: Payer: Self-pay | Admitting: Family Medicine

## 2020-08-18 ENCOUNTER — Ambulatory Visit: Payer: 59 | Admitting: Family Medicine

## 2020-08-19 DIAGNOSIS — M67431 Ganglion, right wrist: Secondary | ICD-10-CM | POA: Diagnosis not present

## 2020-08-19 DIAGNOSIS — E669 Obesity, unspecified: Secondary | ICD-10-CM | POA: Diagnosis not present

## 2020-08-19 DIAGNOSIS — G5601 Carpal tunnel syndrome, right upper limb: Secondary | ICD-10-CM | POA: Diagnosis not present

## 2020-08-20 ENCOUNTER — Ambulatory Visit (INDEPENDENT_AMBULATORY_CARE_PROVIDER_SITE_OTHER): Payer: 59 | Admitting: Surgery

## 2020-08-20 ENCOUNTER — Encounter: Payer: Self-pay | Admitting: Surgery

## 2020-08-20 ENCOUNTER — Other Ambulatory Visit: Payer: Self-pay

## 2020-08-20 VITALS — BP 132/92 | HR 78 | Temp 98.3°F | Ht 67.0 in | Wt 237.0 lb

## 2020-08-20 DIAGNOSIS — N6092 Unspecified benign mammary dysplasia of left breast: Secondary | ICD-10-CM

## 2020-08-20 DIAGNOSIS — Z09 Encounter for follow-up examination after completed treatment for conditions other than malignant neoplasm: Secondary | ICD-10-CM

## 2020-08-20 DIAGNOSIS — D242 Benign neoplasm of left breast: Secondary | ICD-10-CM

## 2020-08-20 NOTE — Progress Notes (Signed)
08/20/2020  HPI: Linda Vega is a 45 y.o. female s/p left breast lumpectomy x 2 areas on 07/29/20 for fibroadenoma with [facility] and a complex fibroadenoma.  Pathology results did not show any malignancy or atypical hyperplasia.  She presents today for follow up.  She reports she's doing well.  Denies any issues with the incisions or worsening pain.  Reports some pulling sensation in the medial portion of the left breast.    Vital signs: BP (!) 132/92   Pulse 78   Temp 98.3 F (36.8 C) (Oral)   Ht 5' 7"  (1.702 m)   Wt 237 lb (107.5 kg)   LMP 10/04/2019 (Exact Date)   SpO2 98%   BMI 37.12 kg/m    Physical Exam: Constitutional: No acute distress Breast:  Left breast incisions at 3 o'clock and 5 o'clock are healing well, without any erythema or wound breakdown.  No tenderness to palpation.  Assessment/Plan: This is a 45 y.o. female s/p left breast lumpectomy x 2.  --Discussed with the patient that the pulling sensation may be related to scar tissue vs heaviness/fullness, which should improve with time. --Incisions are healing well, without any complications. --Reviewed again pathology results -- no malignancy. --Follow up in December with repeat mammogram.   Melvyn Neth, MD [facility]

## 2020-08-20 NOTE — Patient Instructions (Signed)
We will reach out to you to schedule your mammogram in December 2022.If you do not here from our office-please call so we can get this scheduled. You will also need a breast exam after your mammogram.  Please apply warm compresses to your hand to see if this helps. If your left breast feels heavy at any time then you may want to wear a tight fitting sports bra for a few days to see if this helps.

## 2020-08-23 ENCOUNTER — Encounter: Payer: 59 | Admitting: Surgery

## 2020-09-07 DIAGNOSIS — R200 Anesthesia of skin: Secondary | ICD-10-CM | POA: Diagnosis not present

## 2020-09-07 DIAGNOSIS — R202 Paresthesia of skin: Secondary | ICD-10-CM | POA: Diagnosis not present

## 2020-09-15 ENCOUNTER — Other Ambulatory Visit: Payer: Self-pay | Admitting: Family Medicine

## 2020-09-15 DIAGNOSIS — A6000 Herpesviral infection of urogenital system, unspecified: Secondary | ICD-10-CM

## 2020-09-15 MED ORDER — VALACYCLOVIR HCL 1 G PO TABS: ORAL_TABLET | ORAL | 3 refills | Status: DC

## 2020-09-29 DIAGNOSIS — L601 Onycholysis: Secondary | ICD-10-CM | POA: Diagnosis not present

## 2020-10-06 ENCOUNTER — Other Ambulatory Visit: Payer: Self-pay | Admitting: Neurology

## 2020-10-06 DIAGNOSIS — G5601 Carpal tunnel syndrome, right upper limb: Secondary | ICD-10-CM | POA: Diagnosis not present

## 2020-10-06 DIAGNOSIS — M79602 Pain in left arm: Secondary | ICD-10-CM | POA: Diagnosis not present

## 2020-10-06 DIAGNOSIS — M792 Neuralgia and neuritis, unspecified: Secondary | ICD-10-CM | POA: Insufficient documentation

## 2020-10-25 ENCOUNTER — Other Ambulatory Visit: Payer: Self-pay | Admitting: Obstetrics and Gynecology

## 2020-10-25 ENCOUNTER — Other Ambulatory Visit: Payer: Self-pay

## 2020-10-25 ENCOUNTER — Encounter: Payer: Self-pay | Admitting: Obstetrics and Gynecology

## 2020-10-25 ENCOUNTER — Ambulatory Visit (INDEPENDENT_AMBULATORY_CARE_PROVIDER_SITE_OTHER): Payer: 59 | Admitting: Obstetrics and Gynecology

## 2020-10-25 VITALS — BP 120/72 | Ht 65.0 in | Wt 241.8 lb

## 2020-10-25 DIAGNOSIS — B373 Candidiasis of vulva and vagina: Secondary | ICD-10-CM

## 2020-10-25 DIAGNOSIS — Z1231 Encounter for screening mammogram for malignant neoplasm of breast: Secondary | ICD-10-CM

## 2020-10-25 DIAGNOSIS — R875 Abnormal microbiological findings in specimens from female genital organs: Secondary | ICD-10-CM | POA: Diagnosis not present

## 2020-10-25 DIAGNOSIS — B3731 Acute candidiasis of vulva and vagina: Secondary | ICD-10-CM

## 2020-10-25 MED ORDER — FLUCONAZOLE 150 MG PO TABS: 150.0000 mg | ORAL_TABLET | ORAL | 3 refills | Status: DC

## 2020-10-25 NOTE — Patient Instructions (Signed)
General vulvovaginal hygiene measures Keep vulva clean, dry, and well aerated  0 Avoid sleeper pajamas. Nightgowns allow air to circulate.  0 Cotton underpants. Double-rinse underwear after washing to avoid residual irritants. Do not use fabric softeners for underwear and swimsuits.  0 Avoid tights, leotards, and leggings. Skirts and loose-fitting pants allow air to circulate.  0 Avoid sitting in wet swimsuits for long periods of time.  Daily warm bathing   0 Do not use bubble baths or perfumed soaps.  0 soak in clean water (no soap) for 10 to 15 minutes.  0 Use soap to wash regions other than the genital area just before getting out of the tub. Limit use of any soap on genital areas.  0 Rinse the genital area well and gently pat dry.  0 A hair dryer on the cool setting may be helpful to assist with drying the genital region.  0 If the vulvar area is tender or swollen, cool compresses may relieve the discomfort. Emollients may help protect skin.  Review toilet hygiene    1 Sit with knees apart to reduce reflux of urine into the vagina.    0 Emphasize wiping front to back after bowel movements.  0 Wet wipes can be used instead of toilet paper for wiping as long as they don't cause a "stinging" sensation.

## 2020-10-25 NOTE — Telephone Encounter (Signed)
Patient added to schedule at 4

## 2020-10-25 NOTE — Telephone Encounter (Signed)
Please work patient in for a visit today - thanks

## 2020-10-25 NOTE — Progress Notes (Signed)
Patient ID: Linda Vega, female   DOB: 1976/02/21, 45 y.o.   MRN: [id number]  Reason for Consult: Gynecologic Exam   Referred by [name] *  Subjective:     HPI:  Linda Vega is a 45 y.o. female. She reports that Friday of last week she had copious watery discharge. After wiping there was watery discharge and stool. She was concerned that it may of been related to prolapsed bowel from her hysterectomy last year.   Past Medical History:  Diagnosis Date  . Anxiety   . BRCA negative 12/2019   MyRisk neg except AXIN2 VUS  . Family history of breast cancer 12/2019   IBIS=16.7%  . GERD (gastroesophageal reflux disease)   . Headache    h/o migraines  . Hypertension    Family History  Problem Relation Age of Onset  . Hypertension Mother   . Breast cancer Maternal Aunt        35s  . Breast cancer Maternal Grandmother 20  . Breast cancer Maternal Aunt        9s   Past Surgical History:  Procedure Laterality Date  . ABDOMINAL HYSTERECTOMY    . BREAST BIOPSY Right 11/23/2017   US guided breast biopsy of 2:00 mass, coil marker, cellular fibroadenoma  . BREAST BIOPSY Left 07/05/2020   [place] Bx, 3:00, Heart Clip   RF tag [id number]  . BREAST BIOPSY Left 07/05/2020   [place] Bx, 5:00, X Clip  RF tag [id number]  . BREAST CYST ASPIRATION Left    neg  . BREAST LUMPECTOMY WITH RADIOFREQUENCY TAG IDENTIFICATION Left 49/20/1007   Procedure: BREAST LUMPECTOMY WITH RADIOFREQUENCY TAG IDENTIFICATION, lumpectomy x 2;  Surgeon: Olean Ree, MD;  Location: [facility];  Service: General;  Laterality: Left;  . CESAREAN SECTION    . CYSTOSCOPY N/A 10/14/2019   Procedure: CYSTOSCOPY;  Surgeon: Homero Fellers, MD;  Location: [facility];  Service: Gynecology;  Laterality: N/A;  . fibrocystic breast    . IUD REMOVAL N/A 10/14/2019   Procedure: INTRAUTERINE DEVICE (IUD) REMOVAL;  Surgeon: Homero Fellers, MD;  Location: [facility];  Service: Gynecology;  Laterality: N/A;  .  LAPAROSCOPY FOR ECTOPIC PREGNANCY    . TOTAL LAPAROSCOPIC HYSTERECTOMY WITH SALPINGECTOMY Right 10/14/2019   Procedure: TOTAL LAPAROSCOPIC HYSTERECTOMY WITH RIGHT SALPINGECTOMY;  Surgeon: Homero Fellers, MD;  Location: [facility];  Service: Gynecology;  Laterality: Right;    Short Social History:  Social History   Tobacco Use  . Smoking status: Never Smoker  . Smokeless tobacco: Never Used  Substance Use Topics  . Alcohol use: Yes    Comment: occasional, about 3x a month    Allergies  Allergen Reactions  . Shellfish Allergy Hives, Hypertension, Itching, Palpitations, Shortness Of Breath, Swelling and Anaphylaxis  . Chicken Allergy Other (See Comments)    Listed per pt request.  . Eggs Or Egg-Derived Products Hives    Current Outpatient Medications  Medication Sig Dispense Refill  . acetaminophen (TYLENOL) 500 MG tablet Take 2 tablets (1,000 mg total) by mouth every 6 (six) hours as needed for mild pain.    Marland Kitchen albuterol (VENTOLIN HFA) 108 (90 Base) MCG/ACT inhaler Inhale 2 puffs into the lungs every 4 (four) hours as needed for wheezing or shortness of breath (cough). 1 each 0  . amLODipine (NORVASC) 10 MG tablet TAKE 1 TABLET BY MOUTH DAILY. (Patient taking differently: Take 10 mg by mouth every morning.) 90 tablet 3  . AUVI-Q 0.3 MG/0.3ML SOAJ injection Inject  0.3 mg into the muscle as needed for anaphylaxis.     . B Complex-C-Folic Acid (STRESS FORMULA PO) Take 1 tablet by mouth daily. GoodBye Stress by OLLY    . chlorhexidine (PERIDEX) 0.12 % solution SMARTSIG:By Mouth    . Cholecalciferol (VITAMIN D3) 125 MCG (5000 UT) CAPS Take 5,000 Units by mouth every other day.    . diclofenac (VOLTAREN) 75 MG EC tablet Take 75 mg by mouth 2 (two) times daily.    . fexofenadine (ALLEGRA) 180 MG tablet Take 180 mg by mouth daily as needed for allergies or rhinitis.    . fluconazole (DIFLUCAN) 150 MG tablet Take 1 tablet (150 mg total) by mouth every 3 (three) days for 2 doses. 2 tablet 3   . fluticasone (FLONASE) 50 MCG/ACT nasal spray Place 2 sprays into both nostrils daily. Use for 4-6 weeks then stop and use seasonally or as needed. (Patient taking differently: Place 2 sprays into both nostrils daily as needed for allergies. Use for 4-6 weeks then stop and use seasonally or as needed.) 16 g 3  . gabapentin (NEURONTIN) 100 MG capsule Start 1 capsule daily, increase by 1 cap every 2-3 days as tolerated up to 3 times a day, or may take 3 at once in evening. (Patient taking differently: Take 100-300 mg by mouth See admin instructions. Start 1 capsule daily, increase by 1 cap every 2-3 days as tolerated up to 3 times a day, or may take 3 at once in evening.PRN) 90 capsule 1  . ibuprofen (ADVIL) 800 MG tablet Take 1 tablet (800 mg total) by mouth every 8 (eight) hours as needed for moderate pain. 60 tablet 1  . ipratropium (ATROVENT) 0.06 % nasal spray Place 2 sprays into both nostrils 4 (four) times daily. For up to 5-7 days then stop. 15 mL 0  . ketoconazole (NIZORAL) 2 % cream Apply 1 application topically 2 (two) times daily as needed (skin irritation/rash.).   1  . omeprazole (PRILOSEC) 20 MG capsule TAKE 1 CAPSULE (20 MG TOTAL) BY MOUTH DAILY BEFORE BREAKFAST. (Patient taking differently: Take 20 mg by mouth daily as needed (acid reflux).) 90 capsule 1  . valACYclovir (VALTREX) 1000 MG tablet Take half tab (561m) by mouth 2 times a day for 3 to 5 days as needed for flare 20 tablet 3  . baclofen (LIORESAL) 10 MG tablet Take 0.5-1 tablets (5-10 mg total) by mouth 3 (three) times daily as needed for muscle spasms. 30 each 1  . benzonatate (TESSALON) 100 MG capsule Take 1 capsule (100 mg total) by mouth 3 (three) times daily as needed for cough. (Patient not taking: Reported on 10/25/2020) 30 capsule 0  . ibuprofen (ADVIL) 600 MG tablet Take 1 tablet (600 mg total) by mouth every 6 (six) hours as needed. (Patient taking differently: Take 600 mg by mouth every 6 (six) hours as needed for  moderate pain.) 60 tablet 1  . OVER THE COUNTER MEDICATION Take 3 tablets by mouth daily. Dairy Relief    . OVER THE COUNTER MEDICATION Take 2 each by mouth daily. Apple Cider Vinegar  [place] (Patient not taking: Reported on 10/25/2020)     No current facility-administered medications for this visit.    Review of Systems  Constitutional: Negative for chills, fatigue, fever and unexpected weight change.  HENT: Negative for trouble swallowing.  Eyes: Negative for loss of vision.  Respiratory: Negative for cough, shortness of breath and wheezing.  Cardiovascular: Negative for chest pain, leg swelling, palpitations  and syncope.  GI: Negative for abdominal pain, blood in stool, diarrhea, nausea and vomiting.  GU: Negative for difficulty urinating, dysuria, frequency and hematuria.  Musculoskeletal: Negative for back pain, leg pain and joint pain.  Skin: Negative for rash.  Neurological: Negative for dizziness, headaches, light-headedness, numbness and seizures.  Psychiatric: Negative for behavioral problem, confusion, depressed mood and sleep disturbance.        Objective:  Objective   Vitals:   10/25/20 1616  BP: 120/72  Weight: 241 lb 12.8 oz (109.7 kg)  Height: 5' 5"  (1.651 m)   Body mass index is 40.24 kg/m.  Physical Exam Vitals and nursing note reviewed. Exam conducted with a chaperone present.  Constitutional:      Appearance: Normal appearance. She is well-developed.  HENT:     Head: Normocephalic and atraumatic.  Eyes:     Extraocular Movements: Extraocular movements intact.     Pupils: Pupils are equal, round, and reactive to light.  Cardiovascular:     Rate and Rhythm: Normal rate and regular rhythm.  Pulmonary:     Effort: Pulmonary effort is normal. No respiratory distress.     Breath sounds: Normal breath sounds.  Abdominal:     General: Abdomen is flat.     Palpations: Abdomen is soft.  Genitourinary:    Comments: External: Normal appearing vulva. No lesions  noted.  Speculum examination: Normal appearing vaginal cuff.  No blood in the vaginal vault. Copious clumpy white discharge.   Bimanual examination: Uterus absent. Cuff intact.   Musculoskeletal:        General: No signs of injury.  Skin:    General: Skin is warm and dry.  Neurological:     Mental Status: She is alert and oriented to person, place, and time.  Psychiatric:        Behavior: Behavior normal.        Thought Content: Thought content normal.        Judgment: Judgment normal.     Assessment/Plan:     45 yo with vaginal discharge Suggestive of yeast vaginitis Will treat with diflucan. Rx sent with refills.  Ordered mammogram which is due in April 2021.    More than 20 minutes were spent face to face with the patient in the room, reviewing the medical record, labs and images, and coordinating care for the patient. The plan of management was discussed in detail and counseling was provided.     Adrian Prows MD [facility], [facility] 10/25/2020 4:37 PM

## 2020-10-28 LAB — NUSWAB BV AND CANDIDA, NAA
Candida albicans, NAA: NEGATIVE
Candida glabrata, NAA: NEGATIVE

## 2020-11-04 ENCOUNTER — Other Ambulatory Visit: Payer: Self-pay

## 2020-11-04 ENCOUNTER — Other Ambulatory Visit: Payer: Self-pay | Admitting: Family Medicine

## 2020-11-04 MED ORDER — AMLODIPINE BESYLATE 10 MG PO TABS
1.0000 | ORAL_TABLET | Freq: Every day | ORAL | 0 refills | Status: DC
  Filled 2020-11-04: qty 90, 90d supply, fill #0

## 2020-11-09 ENCOUNTER — Other Ambulatory Visit: Payer: Self-pay

## 2020-11-09 DIAGNOSIS — R202 Paresthesia of skin: Secondary | ICD-10-CM | POA: Diagnosis not present

## 2020-11-09 DIAGNOSIS — M79602 Pain in left arm: Secondary | ICD-10-CM | POA: Diagnosis not present

## 2020-11-09 MED ORDER — GABAPENTIN 300 MG PO CAPS
ORAL_CAPSULE | ORAL | 2 refills | Status: DC
  Filled 2020-11-09: qty 90, 30d supply, fill #0

## 2020-11-12 DIAGNOSIS — R202 Paresthesia of skin: Secondary | ICD-10-CM | POA: Insufficient documentation

## 2020-11-24 ENCOUNTER — Other Ambulatory Visit: Payer: Self-pay

## 2020-11-24 ENCOUNTER — Telehealth (INDEPENDENT_AMBULATORY_CARE_PROVIDER_SITE_OTHER): Payer: Self-pay | Admitting: Gastroenterology

## 2020-11-24 DIAGNOSIS — Z1211 Encounter for screening for malignant neoplasm of colon: Secondary | ICD-10-CM

## 2020-11-24 MED ORDER — NA SULFATE-K SULFATE-MG SULF 17.5-3.13-1.6 GM/177ML PO SOLN
1.0000 | Freq: Once | ORAL | 0 refills | Status: AC
  Filled 2020-11-24: qty 354, 1d supply, fill #0

## 2020-11-24 NOTE — Progress Notes (Signed)
Gastroenterology Pre-Procedure Review  Request Date: 12/10/20 Requesting Physician: Dr. Allen Norris  PATIENT REVIEW QUESTIONS: The patient responded to the following health history questions as indicated:    1. Are you having any GI issues? no 2. Do you have a personal history of Polyps? no 3. Do you have a family history of Colon Cancer or Polyps? no 4. Diabetes Mellitus? no 5. Joint replacements in the past 12 months?no 6. Major health problems in the past 3 months?no 7. Any artificial heart valves, MVP, or defibrillator?no    MEDICATIONS & ALLERGIES:    Patient reports the following regarding taking any anticoagulation/antiplatelet therapy:   Plavix, Coumadin, Eliquis, Xarelto, Lovenox, Pradaxa, Brilinta, or Effient? no Aspirin? no  Patient confirms/reports the following medications:  Current Outpatient Medications  Medication Sig Dispense Refill  . acetaminophen (TYLENOL) 500 MG tablet Take 2 tablets (1,000 mg total) by mouth every 6 (six) hours as needed for mild pain.    Marland Kitchen albuterol (VENTOLIN HFA) 108 (90 Base) MCG/ACT inhaler INHALE 2 PUFFS INTO THE LUNGS EVERY 4 (FOUR) HOURS AS NEEDED FOR WHEEZING, SHORTNESS OF BREATH, OR COUGH 18 g 0  . amLODipine (NORVASC) 10 MG tablet TAKE 1 TABLET BY MOUTH DAILY. 90 tablet 0  . AUVI-Q 0.3 MG/0.3ML SOAJ injection Inject 0.3 mg into the muscle as needed for anaphylaxis.     . B Complex-C-Folic Acid (STRESS FORMULA PO) Take 1 tablet by mouth daily. GoodBye Stress by OLLY    . chlorhexidine (PERIDEX) 0.12 % solution SMARTSIG:By Mouth    . Cholecalciferol (VITAMIN D3) 125 MCG (5000 UT) CAPS Take 5,000 Units by mouth every other day.    . diclofenac (VOLTAREN) 75 MG EC tablet Take 75 mg by mouth 2 (two) times daily.    . fexofenadine (ALLEGRA) 180 MG tablet Take 180 mg by mouth daily as needed for allergies or rhinitis.    Marland Kitchen gabapentin (NEURONTIN) 300 MG capsule Take 1 capsule twice daily for 2 weeks, then take 1 capsule in the morning and 2 capsules  at night. 90 capsule 2  . ibuprofen (ADVIL) 600 MG tablet TAKE 1 TABLET BY MOUTH EVERY 6 (SIX) HOURS AS NEEDED. (Patient taking differently: Take 600 mg by mouth every 6 (six) hours as needed for moderate pain.) 60 tablet 1  . ibuprofen (ADVIL) 800 MG tablet TAKE 1 TABLET BY MOUTH EVERY 8 HOURS AS NEEDED FOR MODERATE PAIN. 60 tablet 1  . ketoconazole (NIZORAL) 2 % cream Apply 1 application topically 2 (two) times daily as needed (skin irritation/rash.).   1  . omeprazole (PRILOSEC) 20 MG capsule TAKE 1 CAPSULE (20 MG TOTAL) BY MOUTH DAILY BEFORE BREAKFAST. (Patient taking differently: Take 20 mg by mouth daily as needed (acid reflux).) 90 capsule 1  . OVER THE COUNTER MEDICATION Take 3 tablets by mouth daily. Dairy Relief    . valACYclovir (VALTREX) 1000 MG tablet TAKE 1/2 TABLET BY MOUTH 2 TIMES A DAY FOR 3 TO 5 DAYS AS NEEDED FOR FLARE 20 tablet 3   No current facility-administered medications for this visit.    Patient confirms/reports the following allergies:  Allergies  Allergen Reactions  . Shellfish Allergy Hives, Hypertension, Itching, Palpitations, Shortness Of Breath, Swelling and Anaphylaxis  . Chicken Allergy Other (See Comments)    Listed per pt request.  . Eggs Or Egg-Derived Products Hives    No orders of the defined types were placed in this encounter.   AUTHORIZATION INFORMATION Primary Insurance: 1D#: Group #:  Secondary Insurance: 1D#: Group #:  SCHEDULE INFORMATION: Date: 12/10/20 Time: Location:[facility]

## 2020-12-01 ENCOUNTER — Encounter: Payer: Self-pay | Admitting: Gastroenterology

## 2020-12-02 ENCOUNTER — Other Ambulatory Visit: Payer: Self-pay

## 2020-12-02 DIAGNOSIS — Z1211 Encounter for screening for malignant neoplasm of colon: Secondary | ICD-10-CM

## 2020-12-02 DIAGNOSIS — E669 Obesity, unspecified: Secondary | ICD-10-CM | POA: Diagnosis not present

## 2020-12-02 DIAGNOSIS — M5412 Radiculopathy, cervical region: Secondary | ICD-10-CM | POA: Diagnosis not present

## 2020-12-02 DIAGNOSIS — M542 Cervicalgia: Secondary | ICD-10-CM | POA: Diagnosis not present

## 2020-12-02 DIAGNOSIS — M5030 Other cervical disc degeneration, unspecified cervical region: Secondary | ICD-10-CM | POA: Diagnosis not present

## 2020-12-02 MED ORDER — PREDNISONE 10 MG PO TABS
ORAL_TABLET | ORAL | 0 refills | Status: DC
  Filled 2020-12-02: qty 30, 10d supply, fill #0

## 2020-12-02 MED ORDER — TRAMADOL HCL 50 MG PO TABS
ORAL_TABLET | ORAL | 0 refills | Status: DC
  Filled 2020-12-02: qty 30, 7d supply, fill #0

## 2020-12-02 MED ORDER — NA SULFATE-K SULFATE-MG SULF 17.5-3.13-1.6 GM/177ML PO SOLN
1.0000 | Freq: Once | ORAL | 0 refills | Status: AC
  Filled 2020-12-02: qty 354, 1d supply, fill #0

## 2020-12-03 ENCOUNTER — Other Ambulatory Visit: Payer: Self-pay

## 2020-12-07 ENCOUNTER — Other Ambulatory Visit: Payer: Self-pay

## 2020-12-08 ENCOUNTER — Ambulatory Visit
Admission: RE | Admit: 2020-12-08 | Discharge: 2020-12-08 | Disposition: A | Discharge status: Home / Self Care | Payer: 59 | Source: Ambulatory Visit | Attending: Obstetrics and Gynecology | Admitting: Obstetrics and Gynecology

## 2020-12-08 ENCOUNTER — Other Ambulatory Visit: Payer: Self-pay

## 2020-12-08 DIAGNOSIS — Z1231 Encounter for screening mammogram for malignant neoplasm of breast: Secondary | ICD-10-CM | POA: Diagnosis not present

## 2020-12-08 NOTE — Discharge Instructions (Signed)

## 2020-12-13 ENCOUNTER — Ambulatory Visit: Payer: 59 | Attending: Orthopedic Surgery

## 2020-12-13 ENCOUNTER — Other Ambulatory Visit: Payer: Self-pay | Admitting: Family Medicine

## 2020-12-13 ENCOUNTER — Telehealth: Payer: Self-pay

## 2020-12-13 ENCOUNTER — Other Ambulatory Visit: Payer: Self-pay

## 2020-12-13 DIAGNOSIS — G8929 Other chronic pain: Secondary | ICD-10-CM | POA: Diagnosis not present

## 2020-12-13 DIAGNOSIS — M25512 Pain in left shoulder: Secondary | ICD-10-CM | POA: Insufficient documentation

## 2020-12-13 DIAGNOSIS — Z0000 Encounter for general adult medical examination without abnormal findings: Secondary | ICD-10-CM

## 2020-12-13 DIAGNOSIS — R7309 Other abnormal glucose: Secondary | ICD-10-CM

## 2020-12-13 DIAGNOSIS — I10 Essential (primary) hypertension: Secondary | ICD-10-CM

## 2020-12-13 DIAGNOSIS — E669 Obesity, unspecified: Secondary | ICD-10-CM

## 2020-12-13 DIAGNOSIS — D508 Other iron deficiency anemias: Secondary | ICD-10-CM

## 2020-12-13 DIAGNOSIS — E7800 Pure hypercholesterolemia, unspecified: Secondary | ICD-10-CM

## 2020-12-13 NOTE — Telephone Encounter (Signed)
Pt informed and verbalized understanding.  KP

## 2020-12-13 NOTE — Patient Instructions (Addendum)
Access Code: [email or web address] URL: [email or web address] Date: 12/13/2020 Prepared by: Roxana Hires  Exercises Median Nerve Flossing - Tray - 3 x daily - 7 x weekly - 2 sets - 10 reps - 3s hold

## 2020-12-13 NOTE — Therapy (Signed)
[facility] [address] [place], [place], [address] Phone: [phone]   Fax:  [phone]  Physical Therapy Evaluation  Patient Details  Name: Linda Vega MRN: [id number] Date of Birth: Dec 02, 1975 Referring Provider (PT): Reche Dixon PA-C   Encounter Date: 12/13/2020   PT End of Session - 12/13/20 2137    Visit Number 1    Number of Visits 17    Date for PT Re-Evaluation 02/07/21    Authorization Type eval: 12/13/20    PT Start Time 1615    PT Stop Time 1700    PT Time Calculation (min) 45 min    Activity Tolerance Patient tolerated treatment well    Behavior During Therapy [facility] for tasks assessed/performed           Past Medical History:  Diagnosis Date  . Anxiety   . BRCA negative 12/2019   MyRisk neg except AXIN2 VUS  . Carpal tunnel syndrome right  . Family history of breast cancer 12/2019   IBIS=16.7%  . GERD (gastroesophageal reflux disease)   . Headache    h/o migraines  . Hypertension     Past Surgical History:  Procedure Laterality Date  . ABDOMINAL HYSTERECTOMY    . BREAST BIOPSY Right 11/23/2017   US guided breast biopsy of 2:00 mass, coil marker, cellular fibroadenoma  . BREAST BIOPSY Left 07/05/2020   [place] Bx, 3:00, Heart Clip   RF tag [id number].  Marland Kitchen BREAST BIOPSY Left 07/05/2020   [place] Bx, 5:00, X Clip  RF tag [id number]  . BREAST CYST ASPIRATION Left    neg  . BREAST EXCISIONAL BIOPSY Left 07/29/2020   BENIGN FIBROADENOMA  . BREAST EXCISIONAL BIOPSY Left 07/29/2020   COMPLEX FIBROADENOMA  . BREAST LUMPECTOMY WITH RADIOFREQUENCY TAG IDENTIFICATION Left 88/05/314   Procedure: BREAST LUMPECTOMY WITH RADIOFREQUENCY TAG IDENTIFICATION, lumpectomy x 2;  Surgeon: Olean Ree, MD;  Location: [facility] ORS;  Service: General;  Laterality: Left;  . CESAREAN SECTION    . CYSTOSCOPY N/A 10/14/2019   Procedure: CYSTOSCOPY;  Surgeon: Homero Fellers, MD;  Location: [facility] OR[facility];  Service:  Gynecology;  Laterality: N/A;  . fibrocystic breast    . IUD REMOVAL N/A 10/14/2019   Procedure: INTRAUTERINE DEVICE (IUD) REMOVAL;  Surgeon: Homero Fellers, MD;  Location: [facility] OR[facility];  Service: Gynecology;  Laterality: N/A;  . LAPAROSCOPY FOR ECTOPIC PREGNANCY    . TOTAL LAPAROSCOPIC HYSTERECTOMY WITH SALPINGECTOMY Right 10/14/2019   Procedure: TOTAL LAPAROSCOPIC HYSTERECTOMY WITH RIGHT SALPINGECTOMY;  Surgeon: Homero Fellers, MD;  Location: [facility] ORS;  Service: Gynecology;  Laterality: Right;    There were no vitals filed for this visit.    Subjective Assessment - 12/13/20 2133    Subjective "Nerve pain" that radiates down LUE    Pertinent History Pt states that she "achy" nerve pain that radiates down her LUE to the wrist. Occasionally the L arm feels heavy/weak but these symptoms are brief. Her symptoms started after her COVID shot in July and she started having LUE symptoms in August. Symptoms start near the medial border of the L shoulder blade and radiate down the arm mostly to the wrist. She has experienced symptoms only once past her wrist. No improvement since onset and no issues in the RUE. She does have a history of R carpal tunnel. She reports a lot of tension in her lower neck as well as crepitus but she denies any LUE symptoms with neck movement. Denies numbness/tingling in LUE. Started prednisone  today. She notices that she doesn't experience issues if she lays on her L side but does have an increase in her L shoulder pain if she lays on her R side. Plain films of the cervical spine revealed C4-C5, C5-6, C6-7, and C7-T1 degenerative disc changes with significant C4-C5 and C5-6 anterior osteophyte formation. There is degenerative disc changes from C4-T1 with no subluxation. There is slightly decreased a lot of curve suggestive of muscle spasm. She has a history of chronic low back pain. Reports intermittent night sweats but denies nausea, vomiting, chills, fever, unexplainable  weight gain/loss, or personal history of cancer. Denies bruising, warmth, or swelling of the L shoulder.    Diagnostic tests Cervical plain films: see history    Patient Stated Goals Decrease symptoms    Currently in Pain? No/denies    Pain Score 0-No pain   Worst: 8/10   Pain Location Arm    Pain Orientation Left    Pain Descriptors / Indicators Aching;Dull    Pain Type Chronic pain    Pain Radiating Towards Down LUE to the L wrist (has affected L pinky finger once)    Pain Onset More than a month ago    Pain Frequency Intermittent    Aggravating Factors  cross body reaching, no problems with lifting    Pain Relieving Factors gabapentin (helped for short while but stopped), ibuprofen, Tylenol,    Effect of Pain on Daily Activities Discomfort while at work              [facility] PT Assessment - 12/13/20 2135      Assessment   Medical Diagnosis Back, neck, shoulder pain    Referring Provider (PT) Reche Dixon PA-C    Onset Date/Surgical Date 01/29/20   approximate   Hand Dominance Right    Next MD Visit Not reported    Prior Therapy None for this issue      Precautions   Precautions None      Restrictions   Weight Bearing Restrictions No      Home Environment   Living Environment Private residence      Prior Function   Level of Independence Independent    Vocation Full time employment    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 Occasionally the L arm feels heavy/weak but these symptoms are brief. Her symptoms started after her COVID shot in July and she started having LUE symptoms in August. Symptoms start near the medial border of the L shoulder blade and radiate down the arm mostly to the wrist. She has experienced symptoms only once past her wrist.  No improvement since onset and no issues in the RUE. She does have a history of R carpal tunnel. She reports a lot of tension in her lower neck as well as crepitus but she denies any LUE symptoms with neck movement. Denies numbness/tingling in LUE. Started prednisone today. She notices that she doesn't experience issues if she lays on her L side but does have an increase in her L shoulder pain if she lays on her R side. Plain films of the cervical spine revealed C4-C5, C5-6, C6-7, and C7-T1 degenerative disc changes with significant C4-C5 and C5-6 anterior osteophyte formation.  There is degenerative disc changes from C4-T1 with no subluxation. There is slightly decreased a lot of curve suggestive of muscle spasm. She has a history of chronic low back pain. Reports intermittent night sweats but denies nausea, vomiting, chills, fever, unexplainable weight gain/loss, or personal history of cancer. Denies bruising, warmth, or swelling of the L shoulder.  Pain: 0/10 Present, 0/10 Best, 8/10 Worst: Aggravating factors: cross body reaching, no problems with lifting Easing factors: gabapentin (helped for short while but stopped), ibuprofen, Tylenol,  24 hour pain behavior: variable, does wake her up at times.  Recent neck trauma: No Prior history of neck injury or pain: No Pain quality: aching, dull,  Radiating pain: Yes  Numbness/Tingling: No Follow-up appointment with MD: Not reported Dominant hand: right Imaging: Yes     OBJECTIVE  Mental Status Patient is oriented to person, place and time.  Recent memory is intact.  Remote memory is intact.  Attention span and concentration are intact.  Expressive speech is intact.  Patient's fund of knowledge is within normal limits for educational level.  SENSATION: Grossly intact to light touch bilateral UE as determined by testing dermatomes C2-T2  MUSCULOSKELETAL: Tremor: None Bulk: Normal Tone: Normal  Posture Mild forward head but no gross  deficits identified   Palpation Pt is tender to palpation along medial boarder of L scapula (rhomboids/mid trap) as well as L infraspinatus and lateral deltoid. No reproduction of radiating symptoms;   Strength R/L 5/5 Shoulder flexion (anterior deltoid/pec major/coracobrachialis, axillary n. (C5/6) and musculocutaneous n. (C5-7)) 5/5 Shoulder abduction (deltoid/supraspinatus, axillary/suprascapular n, C5) 5/5 Shoulder external rotation (infraspinatus/teres minor) 5/5 Shoulder internal rotation (subcapularis/lats/pec major) 5/5 Shoulder extension (posterior deltoid, lats, teres major, axillary/thoracodorsal n.) 4/4 Shoulder horizontal abduction 4-/4- Low trap 5/5 Rhomboid 5/5 Elbow flexion (biceps brachii, brachialis, brachioradialis, musculoskeletal n, C5/6) 5/5 Elbow extension (triceps, radial n, C7) 5/5 Wrist Extension (C6/7) 5/5 Wrist Flexion (C6/7) 5/5 Finger adduction (interossei, ulnar n, T1)  AROM Cervical and L shoulder AROM are both grossly WNL without reproduction of pain with active motion or overpressure  PROM Painless and full in all directions  Repeated Movements Deferred    Passive Accessory Intervertebral Motion (PAIVM) Pt denies reproduction of neck pain with CPA C2-T7 and UPA bilaterally C2-T7 however does report general tenderness along spinous processes. Generally hypomobile throughout  Reflex Testing Deferred  SPECIAL TESTS Spurlings A (ipsilateral lateral flexion/axial compression): R: Negative L: Negative Spurlings B (ipsilateral lateral flexion/contralateral rotation/axial compression): R: Negative L: Negative Distraction Test: Positive for relief Hoffman Sign (cervical cord compression): R: Not examined L: Not examined ULTT Median: R: Positive L: Negative ULTT Ulnar: R: Positive L: Negative ULTT Radial: R: Positive L: Negative  Accessory Motions/Glides Glenohumeral: Posterior: R: normal L: normal Inferior: R: normal L: normal Anterior: R: not  examined L: not examined   SPECIAL TESTS  Rotator Cuff  Drop Arm Test: Negative Painful Arc (Pain from 60 to 120 degrees scaption): Negative Infraspinatus Muscle Test: Negative  Subacromial Impingement Hawkins-Kennedy: Negative Neer (Block scapula, PROM flexion): Negative Painful Arc (Pain from 60 to 120 degrees scaption): Negative Empty Can: Negative External Rotation Resistance: Negative Horizontal Adduction: Negative Scapular Assist: Not examined  Labral Tear Biceps Load II (120 elevation, full ER, 90 elbow flexion, full supination, resisted elbow flexion): Not examined Crank (160 scaption, axial load with IR/ER): Not examined Active Compression Test: Not examined  Bicep Tendon Pathology Speed (shoulder flexion to 90, external rotation, full elbow extension, and forearm supination with resistance: Not examined Yergason's (resisted shoulder  ER and supination/biceps tendon pathology): Not examined  Shoulder Instability Sulcus Sign: Not examined Anterior Apprehension: Not examined            Objective measurements completed on examination: See above findings.               PT Education - 12/13/20 2136    Education Details Plan of care and HEP    Person(s) Educated Patient    Methods Explanation;Handout    Comprehension Verbalized understanding            PT Short Term Goals - 12/14/20 1509      PT SHORT TERM GOAL #1   Title Pt will be independent with HEP in order to improve strength and decrease neck/shoulder/LUE pain in order to improve pain-free function at home and work.    Time 4    Period Weeks    Status New    Target Date 01/10/21             PT Long Term Goals - 12/14/20 1509      PT LONG TERM GOAL #1   Title Pt will decrease worst neck/L shoulder/LUE pain as reported on NPRS by at least 3 points in order to demonstrate clinically significant reduction in pain.    Baseline 12/13/20: worst: 8/10    Time 8    Period Weeks     Status New    Target Date 02/07/21      PT LONG TERM GOAL #2   Title Pt will decrease quick DASH score by at least 8% in order to demonstrate clinically significant reduction in disability.    Baseline 12/13/20: pt to complete at next visit    Time 8    Period Weeks    Status New    Target Date 02/07/21      PT LONG TERM GOAL #3   Title Pt will report at least 75% improvement in her symptoms in order to resume full function at home and work with less pain    Time 8    Period Weeks    Status New    Target Date 02/08/21                  Plan - 12/13/20 1701    Clinical Impression Statement Pt is a pleasant 45 year-old female referred for back, neck, and shoulder pain. It is difficult to reproduce her symptoms on exam today. However most consistent reproduction is with upper limb tension testing in LUE. No pain today with any active or passive movements of neck or L shoulder. Negative Spurlings. She does report some relief of pain with cervical traction. She is also tender to palpation along medial boarder of L scapula (rhomboids/mid trap) as well as L infraspinatus and lateral deltoid. No focal deficits in sensation or strength noted today during examination. She will benefit from skilled PT services to address deficits in neck/shoulder/LUE pain and return to pain-free function at home and work.    Personal Factors and Comorbidities Comorbidity 3+;Time since onset of injury/illness/exacerbation    Comorbidities Migraine, carpal tunnel, OA, anxiety    Examination-Activity Limitations Other   Cross body reaching   Examination-Participation Restrictions Occupation    Stability/Clinical Decision Making Stable/Uncomplicated    Clinical Decision Making Low    Rehab Potential Excellent    PT Frequency 2x / week    PT Duration 8 weeks    PT Treatment/Interventions ADLs/Self Care Home Management;Aquatic Therapy;Biofeedback;Canalith Repostioning;Cryotherapy;Electrical  Stimulation;Iontophoresis 24m/ml Dexamethasone;Moist Heat;Traction;Ultrasound;DME  Instruction;Gait training;Stair training;Functional mobility training;Therapeutic activities;Therapeutic exercise;Balance training;Neuromuscular re-education;Patient/family education;Manual techniques;Passive range of motion;Dry needling;Vestibular;Spinal Manipulations;Joint Manipulations    PT Next Visit Plan Have pt complete QuickDASH, shoulder FOTO, assess pec minor and first rib mobility, median nerve glides, cervical traction, trigger point dry needling    PT Home Exercise Plan Access Code: [email or web address]    Consulted and Agree with Plan of Care Patient           Patient will benefit from skilled therapeutic intervention in order to improve the following deficits and impairments:  Pain  Visit Diagnosis: Chronic left shoulder pain     Problem List Patient Active Problem List   Diagnosis Date Noted  . Tingling 11/12/2020  . Nerve pain 10/06/2020  . Left arm pain 10/06/2020  . Carpal tunnel syndrome of right wrist 10/06/2020  . Fibroadenoma of breast, left   . Abnormal uterine bleeding (AUB) 02/06/2020  . Family history of breast cancer 01/20/2020  . Osteoarthritis of spine with radiculopathy, lumbar region 07/16/2019  . Low back pain 03/11/2018  . Obesity (BMI 35.0-39.9 without comorbidity) 09/14/2017  . Anemia associated with acute blood loss 08/27/2017  . Snoring 08/05/2017  . Vitamin D deficiency 08/03/2017  . Migraine without aura and without status migrainosus, not intractable 04/12/2017  . Iron deficiency anemia secondary to inadequate dietary iron intake 09/03/2016  . Essential hypertension with goal blood pressure less than 130/80 05/29/2015  . Anxiety 03/22/2015  . Positive PPD 06/04/2012  . Genital herpes 02/07/2011   Linda Vega PT, DPT, GCS  Linda Vega 12/14/2020, 3:14 PM  [facility] [address]. [place], [place],  [address] Phone: [phone]   Fax:  [phone]  Name: Linda Vega MRN: [id number] Date of Birth: 01/28/1976

## 2020-12-13 NOTE — Telephone Encounter (Signed)
Patient has been schedule for Physical appointment on 02/01/2021. Patient would like to have orders put in and she will go to a [facility] in [place] to have done.

## 2020-12-13 NOTE — Telephone Encounter (Signed)
Orders are in for [facility]. Usually she can notify us within 2-3 days or a week when she plans to get the blood actually drawn and we can release the orders. They are in as FUTURE orders right now since it is about 1 month or 6 weeks away from when she would need the blood drawn.  [name], DO [facility] 12/13/2020, 12:21 PM

## 2020-12-15 NOTE — Patient Instructions (Addendum)
Access Code: [email or web address] URL: [email or web address] Date: 12/16/2020 Prepared by: Roxana Hires  Exercises Median Nerve Flossing - Tray - 3 x daily - 7 x weekly - 2 sets - 10 reps - 3s hold Seated Cervical Sidebending Stretch (Mirrored) - 3 x daily - 7 x weekly - 3 reps - 30s hold

## 2020-12-16 ENCOUNTER — Ambulatory Visit: Payer: 59

## 2020-12-16 ENCOUNTER — Other Ambulatory Visit: Payer: Self-pay

## 2020-12-16 DIAGNOSIS — M25512 Pain in left shoulder: Secondary | ICD-10-CM | POA: Diagnosis not present

## 2020-12-16 DIAGNOSIS — G8929 Other chronic pain: Secondary | ICD-10-CM | POA: Diagnosis not present

## 2020-12-16 NOTE — Therapy (Signed)
[facility] [address]. [place], [place], [address] Phone: [phone]   Fax:  [phone]  Physical Therapy Treatment  Patient Details  Name: Linda Vega MRN: [id number] Date of Birth: 16-Sep-1975 Referring Provider (PT): Reche Dixon PA-C   Encounter Date: 12/16/2020   PT End of Session - 12/17/20 1056    Visit Number 2    Number of Visits 17    Date for PT Re-Evaluation 02/07/21    Authorization Type eval: 12/13/20    PT Start Time 1615    PT Stop Time 1700    PT Time Calculation (min) 45 min    Activity Tolerance Patient tolerated treatment well    Behavior During Therapy [facility] for tasks assessed/performed           Past Medical History:  Diagnosis Date  . Anxiety   . BRCA negative 12/2019   MyRisk neg except AXIN2 VUS  . Carpal tunnel syndrome right  . Family history of breast cancer 12/2019   IBIS=16.7%  . GERD (gastroesophageal reflux disease)   . Headache    h/o migraines  . Hypertension     Past Surgical History:  Procedure Laterality Date  . ABDOMINAL HYSTERECTOMY    . BREAST BIOPSY Right 11/23/2017   US guided breast biopsy of 2:00 mass, coil marker, cellular fibroadenoma  . BREAST BIOPSY Left 07/05/2020   [place] Bx, 3:00, Heart Clip   RF tag [id number].  Marland Kitchen BREAST BIOPSY Left 07/05/2020   [place] Bx, 5:00, X Clip  RF tag [id number]  . BREAST CYST ASPIRATION Left    neg  . BREAST EXCISIONAL BIOPSY Left 07/29/2020   BENIGN FIBROADENOMA  . BREAST EXCISIONAL BIOPSY Left 07/29/2020   COMPLEX FIBROADENOMA  . BREAST LUMPECTOMY WITH RADIOFREQUENCY TAG IDENTIFICATION Left 69/45/0388   Procedure: BREAST LUMPECTOMY WITH RADIOFREQUENCY TAG IDENTIFICATION, lumpectomy x 2;  Surgeon: Olean Ree, MD;  Location: [facility];  Service: General;  Laterality: Left;  . CESAREAN SECTION    . CYSTOSCOPY N/A 10/14/2019   Procedure: CYSTOSCOPY;  Surgeon: Homero Fellers, MD;  Location: [facility];  Service:  Gynecology;  Laterality: N/A;  . fibrocystic breast    . IUD REMOVAL N/A 10/14/2019   Procedure: INTRAUTERINE DEVICE (IUD) REMOVAL;  Surgeon: Homero Fellers, MD;  Location: [facility];  Service: Gynecology;  Laterality: N/A;  . LAPAROSCOPY FOR ECTOPIC PREGNANCY    . TOTAL LAPAROSCOPIC HYSTERECTOMY WITH SALPINGECTOMY Right 10/14/2019   Procedure: TOTAL LAPAROSCOPIC HYSTERECTOMY WITH RIGHT SALPINGECTOMY;  Surgeon: Homero Fellers, MD;  Location: [facility];  Service: Gynecology;  Laterality: Right;    There were no vitals filed for this visit.   Subjective Assessment - 12/16/20 1609    Subjective Pt reports that she is doing alright today. She continues to have pain radiating down her LUE. No resting pain currently. No specific questions/concerns.    Pertinent History Pt states that she "achy" nerve pain that radiates down her LUE to the wrist. Occasionally the L arm feels heavy/weak but these symptoms are brief. Her symptoms started after her COVID shot in July and she started having LUE symptoms in August. Symptoms start near the medial border of the L shoulder blade and radiate down the arm mostly to the wrist. She has experienced symptoms only once past her wrist. No improvement since onset and no issues in the RUE. She does have a history of R carpal tunnel. She reports a lot of tension in her lower neck as well  as crepitus but she denies any LUE symptoms with neck movement. Denies numbness/tingling in LUE. Started prednisone today. She notices that she doesn't experience issues if she lays on her L side but does have an increase in her L shoulder pain if she lays on her R side. Plain films of the cervical spine revealed C4-C5, C5-6, C6-7, and C7-T1 degenerative disc changes with significant C4-C5 and C5-6 anterior osteophyte formation. There is degenerative disc changes from C4-T1 with no subluxation. There is slightly decreased a lot of curve suggestive of muscle spasm. She has a history of  chronic low back pain. Reports intermittent night sweats but denies nausea, vomiting, chills, fever, unexplainable weight gain/loss, or personal history of cancer. Denies bruising, warmth, or swelling of the L shoulder.    Diagnostic tests Cervical plain films: see history    Patient Stated Goals Decrease symptoms    Currently in Pain? No/denies    Pain Onset --               TREATMENT   Manual Therapy   UBE 2 minutes forward/2 minutes backward (4 minutes total) for warm-up during history; QuickDASH: 34.1% Neck FOTO: 74 (predicted decline to 72)   L pec minor assessment, pt reports reproduction of her symptoms down the LUE; L first rib mobilization assessment, pt reports reproduction of her symptoms down the LUE; L first rib mobilizations, grade III, 30s/bout x 3 bouts; L scalene stretch 30s x 2; L upper trap stretch 30s x 2; L shoulder distraction moving through passive L shoulder abduction for upper trap stretch and nerve mobility; L median nerve glides x 2 minutes; Manual cervical traction 30s hold/20s relax x 3 bouts;   Trigger Point Dry Needling (TDN), unbilled Education performed with patient regarding potential benefit of TDN. Reviewed precautions and risks with patient. Reviewed special precautions/risks over lung fields which include pneumothorax. Reviewed signs and symptoms of pneumothorax and advised pt to go to ER immediately if these symptoms develop advise them of dry needling treatment. Extensive time spent with pt to ensure full understanding of TDN risks. Pt provided verbal consent to treatment. Using clean technique TDN performed to L upper trap with 1, 0.25 x 40 single needle placements with local twitch response (LTR). Also performed TDN to L rhomboids/subscapularis with 1, 0.25 x 60 single needle placement, L rhomboid major with rib block using 1, 0.25 x 40 single needle placement, and distal L infraspinatus with 0.25 x 60 single needle placement. Pistoning  technique utilized.    Pt educated throughout session about proper posture and technique with exercises. Improved exercise technique, movement at target joints, use of target muscles after min to mod verbal, visual, tactile cues.     Patient demonstrates good motivation to participate in physical therapy.  She completed QuickDASH which revealed 34.1% self-reported disability.  Assessed left pectoralis minor and left first rib mobility to find reproduction of patient's left upper extremity symptoms in both locations.  Initiated left first rib mobilizations as well as left scalene and upper trap stretches today.  Performed trigger point dry needling with patient today and left upper trap, left rhomboids, and left subscapularis.  She denies reproduction of left upper extremity symptoms during trigger point dry needling.  Added scalene stretch to patient's HEP.  Patient encouraged follow-up as scheduled.  She will benefit from skilled PT services to address deficits in left upper extremity pain and return to full pain-free function at home and work.  PT Education - 12/17/20 1055    Education Details HEP modifications    Person(s) Educated Patient    Methods Explanation    Comprehension Verbalized understanding            PT Short Term Goals - 12/14/20 1509      PT SHORT TERM GOAL #1   Title Pt will be independent with HEP in order to improve strength and decrease neck/shoulder/LUE pain in order to improve pain-free function at home and work.    Time 4    Period Weeks    Status New    Target Date 01/10/21             PT Long Term Goals - 12/14/20 1509      PT LONG TERM GOAL #1   Title Pt will decrease worst neck/L shoulder/LUE pain as reported on NPRS by at least 3 points in order to demonstrate clinically significant reduction in pain.    Baseline 12/13/20: worst: 8/10    Time 8    Period Weeks    Status New    Target Date 02/07/21      PT LONG TERM  GOAL #2   Title Pt will decrease quick DASH score by at least 8% in order to demonstrate clinically significant reduction in disability.    Baseline 12/13/20: pt to complete at next visit    Time 8    Period Weeks    Status New    Target Date 02/07/21      PT LONG TERM GOAL #3   Title Pt will report at least 75% improvement in her symptoms in order to resume full function at home and work with less pain    Time 8    Period Weeks    Status New    Target Date 02/08/21                 Plan - 12/17/20 1056    Clinical Impression Statement Patient demonstrates good motivation to participate in physical therapy.  She completed QuickDASH which revealed 34.1% self-reported disability.  Assessed left pectoralis minor and left first rib mobility to find reproduction of patient's left upper extremity symptoms in both locations.  Initiated left first rib mobilizations as well as left scalene and upper trap stretches today.  Performed trigger point dry needling with patient today and left upper trap, left rhomboids, and left subscapularis.  She denies reproduction of left upper extremity symptoms during trigger point dry needling.  Added scalene stretch to patient's HEP.  Patient encouraged follow-up as scheduled.  She will benefit from skilled PT services to address deficits in left upper extremity pain and return to full pain-free function at home and work.    Personal Factors and Comorbidities Comorbidity 3+;Time since onset of injury/illness/exacerbation    Comorbidities Migraine, carpal tunnel, OA, anxiety    Examination-Activity Limitations Other   Cross body reaching   Examination-Participation Restrictions Occupation    Stability/Clinical Decision Making Stable/Uncomplicated    Rehab Potential Excellent    PT Frequency 2x / week    PT Duration 8 weeks    PT Treatment/Interventions ADLs/Self Care Home Management;Aquatic Therapy;Biofeedback;Canalith Repostioning;Cryotherapy;Electrical  Stimulation;Iontophoresis 48m/ml Dexamethasone;Moist Heat;Traction;Ultrasound;DME Instruction;Gait training;Stair training;Functional mobility training;Therapeutic activities;Therapeutic exercise;Balance training;Neuromuscular re-education;Patient/family education;Manual techniques;Passive range of motion;Dry needling;Vestibular;Spinal Manipulations;Joint Manipulations    PT Next Visit Plan assess pec minor and first rib mobility, median nerve glides, cervical traction, trigger point dry needling    PT Home Exercise Plan Access Code: [email or web address]  Consulted and Agree with Plan of Care Patient           Patient will benefit from skilled therapeutic intervention in order to improve the following deficits and impairments:  Pain  Visit Diagnosis: Chronic left shoulder pain     Problem List Patient Active Problem List   Diagnosis Date Noted  . Tingling 11/12/2020  . Nerve pain 10/06/2020  . Left arm pain 10/06/2020  . Carpal tunnel syndrome of right wrist 10/06/2020  . Fibroadenoma of breast, left   . Abnormal uterine bleeding (AUB) 02/06/2020  . Family history of breast cancer 01/20/2020  . Osteoarthritis of spine with radiculopathy, lumbar region 07/16/2019  . Low back pain 03/11/2018  . Obesity (BMI 35.0-39.9 without comorbidity) 09/14/2017  . Anemia associated with acute blood loss 08/27/2017  . Snoring 08/05/2017  . Vitamin D deficiency 08/03/2017  . Migraine without aura and without status migrainosus, not intractable 04/12/2017  . Iron deficiency anemia secondary to inadequate dietary iron intake 09/03/2016  . Essential hypertension with goal blood pressure less than 130/80 05/29/2015  . Anxiety 03/22/2015  . Positive PPD 06/04/2012  . Genital herpes 02/07/2011   Phillips Grout PT, DPT, GCS  Clarita Mcelvain 12/17/2020, 2:16 PM  [facility] [address]. [place], [place], [address] Phone: [phone]   Fax:   [phone]  Name: LUWANNA BROSSMAN MRN: [id number] Date of Birth: Aug 15, 1975

## 2020-12-20 NOTE — Patient Instructions (Incomplete)
TREATMENT   Manual Therapy   UBE 2 minutes forward/2 minutes backward (4 minutes total) for warm-up during history; QuickDASH: 34.1% Neck FOTO: 74 (predicted decline to 72)   L pec minor assessment, pt reports reproduction of her symptoms down the LUE; L first rib mobilization assessment, pt reports reproduction of her symptoms down the LUE; L first rib mobilizations, grade III, 30s/bout x 3 bouts; L scalene stretch 30s x 2; L upper trap stretch 30s x 2; L shoulder distraction moving through passive L shoulder abduction for upper trap stretch and nerve mobility; L median nerve glides x 2 minutes; Manual cervical traction 30s hold/20s relax x 3 bouts;   Trigger Point Dry Needling (TDN), unbilled Education performed with patient regarding potential benefit of TDN. Reviewed precautions and risks with patient. Reviewed special precautions/risks over lung fields which include pneumothorax. Reviewed signs and symptoms of pneumothorax and advised pt to go to ER immediately if these symptoms develop advise them of dry needling treatment. Extensive time spent with pt to ensure full understanding of TDN risks. Pt provided verbal consent to treatment. Using clean technique TDN performed to L upper trap with 1, 0.25 x 40 single needle placements with local twitch response (LTR). Also performed TDN to L rhomboids/subscapularis with 1, 0.25 x 60 single needle placement, L rhomboid major with rib block using 1, 0.25 x 40 single needle placement, and distal L infraspinatus with 0.25 x 60 single needle placement. Pistoning technique utilized.    Pt educated throughout session about proper posture and technique with exercises. Improved exercise technique, movement at target joints, use of target muscles after min to mod verbal, visual, tactile cues.     Patient demonstrates good motivation to participate in physical therapy.  She completed QuickDASH which revealed 34.1% self-reported disability.   Assessed left pectoralis minor and left first rib mobility to find reproduction of patient's left upper extremity symptoms in both locations.  Initiated left first rib mobilizations as well as left scalene and upper trap stretches today.  Performed trigger point dry needling with patient today and left upper trap, left rhomboids, and left subscapularis.  She denies reproduction of left upper extremity symptoms during trigger point dry needling.  Added scalene stretch to patient's HEP.  Patient encouraged follow-up as scheduled.  She will benefit from skilled PT services to address deficits in left upper extremity pain and return to full pain-free function at home and work.

## 2020-12-21 ENCOUNTER — Ambulatory Visit: Payer: 59

## 2020-12-21 ENCOUNTER — Other Ambulatory Visit: Payer: Self-pay

## 2020-12-21 DIAGNOSIS — G8929 Other chronic pain: Secondary | ICD-10-CM

## 2020-12-21 DIAGNOSIS — M25512 Pain in left shoulder: Secondary | ICD-10-CM | POA: Diagnosis not present

## 2020-12-21 NOTE — Therapy (Signed)
[facility] [address]. [place], [place], [address] Phone: [phone]   Fax:  [phone]  Physical Therapy Treatment  Patient Details  Name: Linda Vega MRN: [id number] Date of Birth: 04-25-1976 Referring Provider (PT): Reche Dixon PA-C   Encounter Date: 12/21/2020   PT End of Session - 12/21/20 1746    Visit Number 3    Number of Visits 17    Date for PT Re-Evaluation 02/07/21    Authorization Type eval: 12/13/20    PT Start Time 1744    PT Stop Time 1830    PT Time Calculation (min) 46 min    Activity Tolerance Patient tolerated treatment well    Behavior During Therapy [facility] for tasks assessed/performed           Past Medical History:  Diagnosis Date  . Anxiety   . BRCA negative 12/2019   MyRisk neg except AXIN2 VUS  . Carpal tunnel syndrome right  . Family history of breast cancer 12/2019   IBIS=16.7%  . GERD (gastroesophageal reflux disease)   . Headache    h/o migraines  . Hypertension     Past Surgical History:  Procedure Laterality Date  . ABDOMINAL HYSTERECTOMY    . BREAST BIOPSY Right 11/23/2017   US guided breast biopsy of 2:00 mass, coil marker, cellular fibroadenoma  . BREAST BIOPSY Left 07/05/2020   [place] Bx, 3:00, Heart Clip   RF tag [id number].  Marland Kitchen BREAST BIOPSY Left 07/05/2020   [place] Bx, 5:00, X Clip  RF tag [id number]  . BREAST CYST ASPIRATION Left    neg  . BREAST EXCISIONAL BIOPSY Left 07/29/2020   BENIGN FIBROADENOMA  . BREAST EXCISIONAL BIOPSY Left 07/29/2020   COMPLEX FIBROADENOMA  . BREAST LUMPECTOMY WITH RADIOFREQUENCY TAG IDENTIFICATION Left 46/27/0350   Procedure: BREAST LUMPECTOMY WITH RADIOFREQUENCY TAG IDENTIFICATION, lumpectomy x 2;  Surgeon: Olean Ree, MD;  Location: [facility] OR[facility];  Service: General;  Laterality: Left;  . CESAREAN SECTION    . CYSTOSCOPY N/A 10/14/2019   Procedure: CYSTOSCOPY;  Surgeon: Homero Fellers, MD;  Location: [facility];  Service:  Gynecology;  Laterality: N/A;  . fibrocystic breast    . IUD REMOVAL N/A 10/14/2019   Procedure: INTRAUTERINE DEVICE (IUD) REMOVAL;  Surgeon: Homero Fellers, MD;  Location: [facility];  Service: Gynecology;  Laterality: N/A;  . LAPAROSCOPY FOR ECTOPIC PREGNANCY    . TOTAL LAPAROSCOPIC HYSTERECTOMY WITH SALPINGECTOMY Right 10/14/2019   Procedure: TOTAL LAPAROSCOPIC HYSTERECTOMY WITH RIGHT SALPINGECTOMY;  Surgeon: Homero Fellers, MD;  Location: [facility];  Service: Gynecology;  Laterality: Right;    There were no vitals filed for this visit.   Subjective Assessment - 12/21/20 1741    Subjective Pt reports that she is doing alright today. No resting pain currently and overall her pain has been much better and she has not had to take any pain medication. No specific questions/concerns.    Pertinent History Pt states that she "achy" nerve pain that radiates down her LUE to the wrist. Occasionally the L arm feels heavy/weak but these symptoms are brief. Her symptoms started after her COVID shot in July and she started having LUE symptoms in August. Symptoms start near the medial border of the L shoulder blade and radiate down the arm mostly to the wrist. She has experienced symptoms only once past her wrist. No improvement since onset and no issues in the RUE. She does have a history of R carpal tunnel. She reports a  lot of tension in her lower neck as well as crepitus but she denies any LUE symptoms with neck movement. Denies numbness/tingling in LUE. Started prednisone today. She notices that she doesn't experience issues if she lays on her L side but does have an increase in her L shoulder pain if she lays on her R side. Plain films of the cervical spine revealed C4-C5, C5-6, C6-7, and C7-T1 degenerative disc changes with significant C4-C5 and C5-6 anterior osteophyte formation. There is degenerative disc changes from C4-T1 with no subluxation. There is slightly decreased a lot of curve suggestive  of muscle spasm. She has a history of chronic low back pain. Reports intermittent night sweats but denies nausea, vomiting, chills, fever, unexplainable weight gain/loss, or personal history of cancer. Denies bruising, warmth, or swelling of the L shoulder.    Diagnostic tests Cervical plain films: see history    Patient Stated Goals Decrease symptoms    Currently in Pain? No/denies              TREATMENT   Manual Therapy   UBE 2 minutes forward/2 minutes backward (4 minutes total) for warm-up during history; L pec minor STM/trigger point release; L first rib mobilizations, grade III, 30s/bout x 3 bouts; L scalene stretch 30s x 2; L shoulder distraction moving through passive L shoulder abduction for upper trap stretch and nerve mobility; L median nerve glides x 2 minutes; Manual cervical traction 30s hold/20s relax x 3 bouts;   Ther-ex  Prone L shoulder mid trap row with manual resistance from therapist x 10; Prone L shoulder extension with manual resistance from therapist x 10; Prone L shoulder low trap flexion (Y's) with manual resistance from therapist x 10; Prone L shoulder ER with manual resistance from therapist x 10; Prone L shoulder IR with manual resistance from therapist x 10;   Pt educated throughout session about proper posture and technique with exercises. Improved exercise technique, movement at target joints, use of target muscles after min to mod verbal, visual, tactile cues.     Patient demonstrates good motivation to participate in physical therapy. Repeated left first rib mobilizations as well as left scalene stretch. Deferred trigger point dry needling with patient today as she denies any pain upon arrival and no significant trigger points identified. Introduced light periscapular and rotator cuff strengthening. Added rhomboid stretch to HEP. Patient encouraged follow-up as scheduled.  She will benefit from skilled PT services to address deficits in left  upper extremity pain and return to full pain-free function at home and work.                          PT Short Term Goals - 12/14/20 1509      PT SHORT TERM GOAL #1   Title Pt will be independent with HEP in order to improve strength and decrease neck/shoulder/LUE pain in order to improve pain-free function at home and work.    Time 4    Period Weeks    Status New    Target Date 01/10/21             PT Long Term Goals - 12/14/20 1509      PT LONG TERM GOAL #1   Title Pt will decrease worst neck/L shoulder/LUE pain as reported on NPRS by at least 3 points in order to demonstrate clinically significant reduction in pain.    Baseline 12/13/20: worst: 8/10    Time 8  Period Weeks    Status New    Target Date 02/07/21      PT LONG TERM GOAL #2   Title Pt will decrease quick DASH score by at least 8% in order to demonstrate clinically significant reduction in disability.    Baseline 12/13/20: pt to complete at next visit    Time 8    Period Weeks    Status New    Target Date 02/07/21      PT LONG TERM GOAL #3   Title Pt will report at least 75% improvement in her symptoms in order to resume full function at home and work with less pain    Time 8    Period Weeks    Status New    Target Date 02/08/21                 Plan - 12/21/20 1746    Clinical Impression Statement Patient demonstrates good motivation to participate in physical therapy. Repeated left first rib mobilizations as well as left scalene stretch. Deferred trigger point dry needling with patient today as she denies any pain upon arrival and no significant trigger points identified. Introduced light periscapular and rotator cuff strengthening. Added rhomboid stretch to HEP. Patient encouraged follow-up as scheduled.  She will benefit from skilled PT services to address deficits in left upper extremity pain and return to full pain-free function at home and work.    Personal Factors and  Comorbidities Comorbidity 3+;Time since onset of injury/illness/exacerbation    Comorbidities Migraine, carpal tunnel, OA, anxiety    Examination-Activity Limitations Other   Cross body reaching   Examination-Participation Restrictions Occupation    Stability/Clinical Decision Making Stable/Uncomplicated    Rehab Potential Excellent    PT Frequency 2x / week    PT Duration 8 weeks    PT Treatment/Interventions ADLs/Self Care Home Management;Aquatic Therapy;Biofeedback;Canalith Repostioning;Cryotherapy;Electrical Stimulation;Iontophoresis 9m/ml Dexamethasone;Moist Heat;Traction;Ultrasound;DME Instruction;Gait training;Stair training;Functional mobility training;Therapeutic activities;Therapeutic exercise;Balance training;Neuromuscular re-education;Patient/family education;Manual techniques;Passive range of motion;Dry needling;Vestibular;Spinal Manipulations;Joint Manipulations    PT Next Visit Plan median nerve glides, cervical traction, trigger point dry needling, strengthening    PT Home Exercise Plan Access Code: [email or web address]   Consulted and Agree with Plan of Care Patient           Patient will benefit from skilled therapeutic intervention in order to improve the following deficits and impairments:  Pain  Visit Diagnosis: Chronic left shoulder pain     Problem List Patient Active Problem List   Diagnosis Date Noted  . Tingling 11/12/2020  . Nerve pain 10/06/2020  . Left arm pain 10/06/2020  . Carpal tunnel syndrome of right wrist 10/06/2020  . Fibroadenoma of breast, left   . Abnormal uterine bleeding (AUB) 02/06/2020  . Family history of breast cancer 01/20/2020  . Osteoarthritis of spine with radiculopathy, lumbar region 07/16/2019  . Low back pain 03/11/2018  . Obesity (BMI 35.0-39.9 without comorbidity) 09/14/2017  . Anemia associated with acute blood loss 08/27/2017  . Snoring 08/05/2017  . Vitamin D deficiency 08/03/2017  . Migraine without aura and without status  migrainosus, not intractable 04/12/2017  . Iron deficiency anemia secondary to inadequate dietary iron intake 09/03/2016  . Essential hypertension with goal blood pressure less than 130/80 05/29/2015  . Anxiety 03/22/2015  . Positive PPD 06/04/2012  . Genital herpes 02/07/2011   JPhillips GroutPT, DPT, GCS  Jabril Pursell 12/22/2020, 9:57 AM  [facility] [place] [address]  Phone: [phone]   Fax:  [phone]  Name: Linda Vega MRN: [id number] Date of Birth: [birth year]

## 2020-12-22 ENCOUNTER — Ambulatory Visit: Payer: 59 | Admitting: Physical Therapy

## 2021-01-03 ENCOUNTER — Other Ambulatory Visit: Payer: Self-pay

## 2021-01-03 ENCOUNTER — Ambulatory Visit: Payer: 59 | Attending: Orthopedic Surgery

## 2021-01-03 DIAGNOSIS — G8929 Other chronic pain: Secondary | ICD-10-CM | POA: Diagnosis not present

## 2021-01-03 DIAGNOSIS — M25512 Pain in left shoulder: Secondary | ICD-10-CM | POA: Insufficient documentation

## 2021-01-03 NOTE — Therapy (Signed)
[facility] [address]. [place], [place], [address] Phone: [phone]   Fax:  [phone]  Physical Therapy Treatment  Patient Details  Name: Linda Vega MRN: [id number] Date of Birth: 04/25/1976 Referring Provider (PT): Reche Dixon PA-C   Encounter Date: 01/03/2021   PT End of Session - 01/03/21 1617    Visit Number 4    Number of Visits 17    Date for PT Re-Evaluation 02/07/21    Authorization Type eval: 12/13/20    PT Start Time 1619    PT Stop Time 1700    PT Time Calculation (min) 41 min    Activity Tolerance Patient tolerated treatment well    Behavior During Therapy [facility] for tasks assessed/performed           Past Medical History:  Diagnosis Date  . Anxiety   . BRCA negative 12/2019   MyRisk neg except AXIN2 VUS  . Carpal tunnel syndrome right  . Family history of breast cancer 12/2019   IBIS=16.7%  . GERD (gastroesophageal reflux disease)   . Headache    h/o migraines  . Hypertension     Past Surgical History:  Procedure Laterality Date  . ABDOMINAL HYSTERECTOMY    . BREAST BIOPSY Right 11/23/2017   US guided breast biopsy of 2:00 mass, coil marker, cellular fibroadenoma  . BREAST BIOPSY Left 07/05/2020   [place] Bx, 3:00, Heart Clip   RF tag [id number].  Marland Kitchen BREAST BIOPSY Left 07/05/2020   [place] Bx, 5:00, X Clip  RF tag [id number]  . BREAST CYST ASPIRATION Left    neg  . BREAST EXCISIONAL BIOPSY Left 07/29/2020   BENIGN FIBROADENOMA  . BREAST EXCISIONAL BIOPSY Left 07/29/2020   COMPLEX FIBROADENOMA  . BREAST LUMPECTOMY WITH RADIOFREQUENCY TAG IDENTIFICATION Left 57/32/2025   Procedure: BREAST LUMPECTOMY WITH RADIOFREQUENCY TAG IDENTIFICATION, lumpectomy x 2;  Surgeon: Olean Ree, MD;  Location: [facility];  Service: General;  Laterality: Left;  . CESAREAN SECTION    . CYSTOSCOPY N/A 10/14/2019   Procedure: CYSTOSCOPY;  Surgeon: Homero Fellers, MD;  Location: [facility];  Service:  Gynecology;  Laterality: N/A;  . fibrocystic breast    . IUD REMOVAL N/A 10/14/2019   Procedure: INTRAUTERINE DEVICE (IUD) REMOVAL;  Surgeon: Homero Fellers, MD;  Location: [facility];  Service: Gynecology;  Laterality: N/A;  . LAPAROSCOPY FOR ECTOPIC PREGNANCY    . TOTAL LAPAROSCOPIC HYSTERECTOMY WITH SALPINGECTOMY Right 10/14/2019   Procedure: TOTAL LAPAROSCOPIC HYSTERECTOMY WITH RIGHT SALPINGECTOMY;  Surgeon: Homero Fellers, MD;  Location: [facility];  Service: Gynecology;  Laterality: Right;    There were no vitals filed for this visit.   Subjective Assessment - 01/03/21 1616    Subjective Pt reports that she is doing alright today. She had some increase in her LUE pain over the weekend. She reports 8/10 L shoulder pain upon arrival today. She is also having a headache today.  No specific questions/concerns.    Pertinent History Pt states that she "achy" nerve pain that radiates down her LUE to the wrist. Occasionally the L arm feels heavy/weak but these symptoms are brief. Her symptoms started after her COVID shot in July and she started having LUE symptoms in August. Symptoms start near the medial border of the L shoulder blade and radiate down the arm mostly to the wrist. She has experienced symptoms only once past her wrist. No improvement since onset and no issues in the RUE. She does have a history of  R carpal tunnel. She reports a lot of tension in her lower neck as well as crepitus but she denies any LUE symptoms with neck movement. Denies numbness/tingling in LUE. Started prednisone today. She notices that she doesn't experience issues if she lays on her L side but does have an increase in her L shoulder pain if she lays on her R side. Plain films of the cervical spine revealed C4-C5, C5-6, C6-7, and C7-T1 degenerative disc changes with significant C4-C5 and C5-6 anterior osteophyte formation. There is degenerative disc changes from C4-T1 with no subluxation. There is slightly  decreased a lot of curve suggestive of muscle spasm. She has a history of chronic low back pain. Reports intermittent night sweats but denies nausea, vomiting, chills, fever, unexplainable weight gain/loss, or personal history of cancer. Denies bruising, warmth, or swelling of the L shoulder.    Diagnostic tests Cervical plain films: see history    Patient Stated Goals Decrease symptoms    Currently in Pain? Yes    Pain Score 8     Pain Location Shoulder    Pain Orientation Left    Pain Descriptors / Indicators Aching    Pain Type Chronic pain    Pain Onset More than a month ago    Pain Frequency Intermittent                TREATMENT   Manual Therapy UBE 2 minutes forward/2 minutes backward (4 minutes total) for warm-up during history; L first rib mobilizations, grade III, 30s/bout x 3 bouts; L scalene, upper trap, and levator stretch30s x 2; L shoulder distractionmoving through passive L shoulder abductionfor upper trap stretch and nerve mobility; L median nerve glidesx 2 minutes; Manual cervical traction30s hold/20s relax x 3 bouts;   Ther-ex  Supine L shoulder flexion to 90 degrees with manual resistance form therapist 2 x 10; R sidelying L shoulder abduction to 90 degrees with manual resistance form therapist 2 x 10; R sidelying L shoulder ER with manual resistance form therapist 2 x 10;   Trigger Point Dry Needling (TDN), unbilled Previously provided education to patient regarding potential benefit of TDN as well as precautions and risks including special precautions/risks over lung fields which include pneumothorax. Previously reviewed signs and symptoms of pneumothorax and advised pt to go to ER immediately if these symptoms develop advise them of dry needling treatment. Extensive time spent with pt to ensure full understanding of TDN risks. Pt provided verbal consent to treatment. Using clean technique in supine TDN performed to L upper trap with 2, 0.25 x  60 single needle placements with local twitch response (LTR). Pistoning technique utilized.    Pt educated throughout session about proper posture and technique with exercises. Improved exercise technique, movement at target joints, use of target muscles after min to mod verbal, visual, tactile cues.   Patient demonstrates good motivation to participate in physical therapy. Slight regression today with increase in her pain. Repeated manual techniques for L upper quarter pain as well as trigger point dry needling to L upper trapezius. Continued with light periscapular and rotator cuff strengthening. Patient encouraged follow-up as scheduled. She will benefit from skilled PT services to address deficits in left upper extremity pain and return to full pain-free function at home and work.                         PT Short Term Goals - 12/14/20 1509      PT SHORT  TERM GOAL #1   Title Pt will be independent with HEP in order to improve strength and decrease neck/shoulder/LUE pain in order to improve pain-free function at home and work.    Time 4    Period Weeks    Status New    Target Date 01/10/21             PT Long Term Goals - 12/14/20 1509      PT LONG TERM GOAL #1   Title Pt will decrease worst neck/L shoulder/LUE pain as reported on NPRS by at least 3 points in order to demonstrate clinically significant reduction in pain.    Baseline 12/13/20: worst: 8/10    Time 8    Period Weeks    Status New    Target Date 02/07/21      PT LONG TERM GOAL #2   Title Pt will decrease quick DASH score by at least 8% in order to demonstrate clinically significant reduction in disability.    Baseline 12/13/20: pt to complete at next visit    Time 8    Period Weeks    Status New    Target Date 02/07/21      PT LONG TERM GOAL #3   Title Pt will report at least 75% improvement in her symptoms in order to resume full function at home and work with less pain    Time 8     Period Weeks    Status New    Target Date 02/08/21                 Plan - 01/03/21 1617    Clinical Impression Statement Patient demonstrates good motivation to participate in physical therapy. Slight regression today with increase in her pain. Repeated manual techniques for L upper quarter pain as well as trigger point dry needling to L upper trapezius. Continued with light periscapular and rotator cuff strengthening. Patient encouraged follow-up as scheduled.  She will benefit from skilled PT services to address deficits in left upper extremity pain and return to full pain-free function at home and work.    Personal Factors and Comorbidities Comorbidity 3+;Time since onset of injury/illness/exacerbation    Comorbidities Migraine, carpal tunnel, OA, anxiety    Examination-Activity Limitations Other   Cross body reaching   Examination-Participation Restrictions Occupation    Stability/Clinical Decision Making Stable/Uncomplicated    Rehab Potential Excellent    PT Frequency 2x / week    PT Duration 8 weeks    PT Treatment/Interventions ADLs/Self Care Home Management;Aquatic Therapy;Biofeedback;Canalith Repostioning;Cryotherapy;Electrical Stimulation;Iontophoresis 36m/ml Dexamethasone;Moist Heat;Traction;Ultrasound;DME Instruction;Gait training;Stair training;Functional mobility training;Therapeutic activities;Therapeutic exercise;Balance training;Neuromuscular re-education;Patient/family education;Manual techniques;Passive range of motion;Dry needling;Vestibular;Spinal Manipulations;Joint Manipulations    PT Next Visit Plan median nerve glides, cervical traction, trigger point dry needling, strengthening    PT Home Exercise Plan Access Code: [email or web address]   Consulted and Agree with Plan of Care Patient           Patient will benefit from skilled therapeutic intervention in order to improve the following deficits and impairments:  Pain  Visit Diagnosis: Chronic left shoulder  pain     Problem List Patient Active Problem List   Diagnosis Date Noted  . Tingling 11/12/2020  . Nerve pain 10/06/2020  . Left arm pain 10/06/2020  . Carpal tunnel syndrome of right wrist 10/06/2020  . Fibroadenoma of breast, left   . Abnormal uterine bleeding (AUB) 02/06/2020  . Family history of breast cancer 01/20/2020  . Osteoarthritis of spine with radiculopathy,  lumbar region 07/16/2019  . Low back pain 03/11/2018  . Obesity (BMI 35.0-39.9 without comorbidity) 09/14/2017  . Anemia associated with acute blood loss 08/27/2017  . Snoring 08/05/2017  . Vitamin D deficiency 08/03/2017  . Migraine without aura and without status migrainosus, not intractable 04/12/2017  . Iron deficiency anemia secondary to inadequate dietary iron intake 09/03/2016  . Essential hypertension with goal blood pressure less than 130/80 05/29/2015  . Anxiety 03/22/2015  . Positive PPD 06/04/2012  . Genital herpes 02/07/2011   Linda Vega PT, DPT, GCS  Linda Vega 01/04/2021, 10:59 AM  [facility] [address] [place], [place], [address] Phone: [phone]   Fax:  [phone]  Name: Linda Vega MRN: [id number] Date of Birth: 07-27-1976

## 2021-01-04 ENCOUNTER — Encounter: Payer: 59 | Admitting: Physical Therapy

## 2021-01-05 ENCOUNTER — Encounter: Payer: Self-pay | Admitting: Gastroenterology

## 2021-01-06 ENCOUNTER — Other Ambulatory Visit: Payer: Self-pay

## 2021-01-06 ENCOUNTER — Encounter: Payer: 59 | Admitting: Physical Therapy

## 2021-01-06 ENCOUNTER — Ambulatory Visit: Payer: 59

## 2021-01-06 DIAGNOSIS — M25512 Pain in left shoulder: Secondary | ICD-10-CM

## 2021-01-06 DIAGNOSIS — G8929 Other chronic pain: Secondary | ICD-10-CM

## 2021-01-06 NOTE — Therapy (Signed)
[facility] [facility] [address] [place], [place], [address] Phone: [phone]   Fax:  [phone]  Physical Therapy Treatment/Goals Update  Patient Details  Name: Linda Vega MRN: [id number] Date of Birth: Dec 28, 1975 Referring Provider (PT): Reche Dixon PA-C   Encounter Date: 01/06/2021   PT End of Session - 01/06/21 1715     Visit Number 5    Number of Visits 17    Date for PT Re-Evaluation 02/07/21    Authorization Type eval: 12/13/20    PT Start Time 1703    PT Stop Time 1745    PT Time Calculation (min) 42 min    Activity Tolerance Patient tolerated treatment well    Behavior During Therapy [facility] for tasks assessed/performed             Past Medical History:  Diagnosis Date   Anxiety    BRCA negative 12/2019   MyRisk neg except AXIN2 VUS   Carpal tunnel syndrome right   Family history of breast cancer 12/2019   IBIS=16.7%   GERD (gastroesophageal reflux disease)    Headache    h/o migraines   Hypertension     Past Surgical History:  Procedure Laterality Date   ABDOMINAL HYSTERECTOMY     BREAST BIOPSY Right 11/23/2017   US guided breast biopsy of 2:00 mass, coil marker, cellular fibroadenoma   BREAST BIOPSY Left 07/05/2020   [place] Bx, 3:00, Heart Clip   RF tag [id number].   BREAST BIOPSY Left 07/05/2020   [place] Bx, 5:00, X Clip  RF tag [id number]   BREAST CYST ASPIRATION Left    neg   BREAST EXCISIONAL BIOPSY Left 07/29/2020   BENIGN FIBROADENOMA   BREAST EXCISIONAL BIOPSY Left 07/29/2020   COMPLEX FIBROADENOMA   BREAST LUMPECTOMY WITH RADIOFREQUENCY TAG IDENTIFICATION Left 55/73/2202   Procedure: BREAST LUMPECTOMY WITH RADIOFREQUENCY TAG IDENTIFICATION, lumpectomy x 2;  Surgeon: Olean Ree, MD;  Location: [facility];  Service: General;  Laterality: Left;   CESAREAN SECTION     CYSTOSCOPY N/A 10/14/2019   Procedure: CYSTOSCOPY;  Surgeon: Homero Fellers, MD;  Location: [facility];  Service:  Gynecology;  Laterality: N/A;   fibrocystic breast     IUD REMOVAL N/A 10/14/2019   Procedure: INTRAUTERINE DEVICE (IUD) REMOVAL;  Surgeon: Homero Fellers, MD;  Location: [facility];  Service: Gynecology;  Laterality: N/A;   LAPAROSCOPY FOR ECTOPIC PREGNANCY     TOTAL LAPAROSCOPIC HYSTERECTOMY WITH SALPINGECTOMY Right 10/14/2019   Procedure: TOTAL LAPAROSCOPIC HYSTERECTOMY WITH RIGHT SALPINGECTOMY;  Surgeon: Homero Fellers, MD;  Location: [facility];  Service: Gynecology;  Laterality: Right;    There were no vitals filed for this visit.   Subjective Assessment - 01/06/21 1712     Subjective Pt reports that she is doing alright today. She is not having any pain in her shoulder currently and denies any pain since her last therapy session.  No specific questions/concerns.    Pertinent History Pt states that she "achy" nerve pain that radiates down her LUE to the wrist. Occasionally the L arm feels heavy/weak but these symptoms are brief. Her symptoms started after her COVID shot in July and she started having LUE symptoms in August. Symptoms start near the medial border of the L shoulder blade and radiate down the arm mostly to the wrist. She has experienced symptoms only once past her wrist. No improvement since onset and no issues in the RUE. She does have a history of R carpal tunnel.  She reports a lot of tension in her lower neck as well as crepitus but she denies any LUE symptoms with neck movement. Denies numbness/tingling in LUE. Started prednisone today. She notices that she doesn't experience issues if she lays on her L side but does have an increase in her L shoulder pain if she lays on her R side. Plain films of the cervical spine revealed C4-C5, C5-6, C6-7, and C7-T1 degenerative disc changes with significant C4-C5 and C5-6 anterior osteophyte formation. There is degenerative disc changes from C4-T1 with no subluxation. There is slightly decreased a lot of curve suggestive of muscle  spasm. She has a history of chronic low back pain. Reports intermittent night sweats but denies nausea, vomiting, chills, fever, unexplainable weight gain/loss, or personal history of cancer. Denies bruising, warmth, or swelling of the L shoulder.    Diagnostic tests Cervical plain films: see history    Patient Stated Goals Decrease symptoms    Currently in Pain? No/denies    Pain Onset --                TREATMENT     Manual Therapy   Pt completed QuickDASH: 18.2% (34.1% initially) Pt reports approximately 70% improvement in her symptoms since starting therapy;  L first rib mobilizations, grade III, 30s/bout x 3 bouts; L scalene, upper trap, and levator stretch 30s x 2 each; L shoulder distraction moving through passive L shoulder abduction for upper trap stretch and nerve mobility; L median nerve glides x 2 minutes; Manual L cervical traction 30s hold/20s relax x 3 bouts;     Ther-ex  UBE 2 minutes forward/2 minutes backward (4 minutes total) for warm-up during history; Supine L shoulder flexion to 90 degrees with manual resistance form therapist 2 x 10; Nautilus bilateral lat rows 80# 2 x 10; Nautilus L shoulder ER 20# 2 x 10; Nautilus L shoulder row 40# 2 x 10;     Pt educated throughout session about proper posture and technique with exercises. Improved exercise technique, movement at target joints, use of target muscles after min to mod verbal, visual, tactile cues.     Updated goals with patient today. Her QuickDASH improved from 34.1% initially to 18.2% today and she reports approximately 70% improvement in her symptoms since starting therapy Repeated manual techniques for L upper quarter pain today and continued with L upper quarter strengthening. Patient encouraged follow-up as scheduled. Pt will return for once session/week for the next two weeks as she works toward discharge if pain remains improved. She will benefit from skilled PT services to address deficits in  left upper extremity pain and return to full pain-free function at home and work.                         PT Short Term Goals - 12/14/20 1509       PT SHORT TERM GOAL #1   Title Pt will be independent with HEP in order to improve strength and decrease neck/shoulder/LUE pain in order to improve pain-free function at home and work.    Time 4    Period Weeks    Status New    Target Date 01/10/21               PT Long Term Goals - 12/14/20 1509       PT LONG TERM GOAL #1   Title Pt will decrease worst neck/L shoulder/LUE pain as reported on NPRS by at least 3  points in order to demonstrate clinically significant reduction in pain.    Baseline 12/13/20: worst: 8/10    Time 8    Period Weeks    Status New    Target Date 02/07/21      PT LONG TERM GOAL #2   Title Pt will decrease quick DASH score by at least 8% in order to demonstrate clinically significant reduction in disability.    Baseline 12/13/20: pt to complete at next visit    Time 8    Period Weeks    Status New    Target Date 02/07/21      PT LONG TERM GOAL #3   Title Pt will report at least 75% improvement in her symptoms in order to resume full function at home and work with less pain    Time 8    Period Weeks    Status New    Target Date 02/08/21                   Plan - 01/06/21 1715     Clinical Impression Statement Updated goals with patient today. Her QuickDASH improved from 34.1% initially to 18.2% today and she reports approximately 70% improvement in her symptoms since starting therapy Repeated manual techniques for L upper quarter pain today and continued with L upper quarter strengthening. Patient encouraged follow-up as scheduled. Pt will return for once session/week for the next two weeks as she works toward discharge if pain remains improved. She will benefit from skilled PT services to address deficits in left upper extremity pain and return to full pain-free function at  home and work.    Personal Factors and Comorbidities Comorbidity 3+;Time since onset of injury/illness/exacerbation    Comorbidities Migraine, carpal tunnel, OA, anxiety    Examination-Activity Limitations Other   Cross body reaching   Examination-Participation Restrictions Occupation    Stability/Clinical Decision Making Stable/Uncomplicated    Rehab Potential Excellent    PT Frequency 2x / week    PT Duration 8 weeks    PT Treatment/Interventions ADLs/Self Care Home Management;Aquatic Therapy;Biofeedback;Canalith Repostioning;Cryotherapy;Electrical Stimulation;Iontophoresis 39m/ml Dexamethasone;Moist Heat;Traction;Ultrasound;DME Instruction;Gait training;Stair training;Functional mobility training;Therapeutic activities;Therapeutic exercise;Balance training;Neuromuscular re-education;Patient/family education;Manual techniques;Passive range of motion;Dry needling;Vestibular;Spinal Manipulations;Joint Manipulations    PT Next Visit Plan median nerve glides, cervical traction, trigger point dry needling, strengthening    PT Home Exercise Plan Access Code: [email or web address]   Consulted and Agree with Plan of Care Patient             Patient will benefit from skilled therapeutic intervention in order to improve the following deficits and impairments:  Pain  Visit Diagnosis: Chronic left shoulder pain     Problem List Patient Active Problem List   Diagnosis Date Noted   Tingling 11/12/2020   Nerve pain 10/06/2020   Left arm pain 10/06/2020   Carpal tunnel syndrome of right wrist 10/06/2020   Fibroadenoma of breast, left    Abnormal uterine bleeding (AUB) 02/06/2020   Family history of breast cancer 01/20/2020   Osteoarthritis of spine with radiculopathy, lumbar region 07/16/2019   Low back pain 03/11/2018   Obesity (BMI 35.0-39.9 without comorbidity) 09/14/2017   Anemia associated with acute blood loss 08/27/2017   Snoring 08/05/2017   Vitamin D deficiency 08/03/2017   Migraine  without aura and without status migrainosus, not intractable 04/12/2017   Iron deficiency anemia secondary to inadequate dietary iron intake 09/03/2016   Essential hypertension with goal blood pressure less than 130/80 05/29/2015   Anxiety 03/22/2015  Positive PPD 06/04/2012   Genital herpes 02/07/2011   Phillips Grout PT, DPT, GCS  Aloni Chuang 01/07/2021, 3:06 PM  [facility] [address]. [place], [place], [address] Phone: [phone]   Fax:  [phone]  Name: Linda Vega MRN: [id number] Date of Birth: October 14, 1975

## 2021-01-10 ENCOUNTER — Telehealth: Payer: Self-pay | Admitting: Gastroenterology

## 2021-01-10 NOTE — Telephone Encounter (Signed)
Please call to go over pre op instructions

## 2021-01-11 ENCOUNTER — Other Ambulatory Visit: Payer: Self-pay

## 2021-01-11 ENCOUNTER — Encounter: Payer: 59 | Admitting: Physical Therapy

## 2021-01-11 ENCOUNTER — Ambulatory Visit: Payer: 59

## 2021-01-11 DIAGNOSIS — G8929 Other chronic pain: Secondary | ICD-10-CM | POA: Diagnosis not present

## 2021-01-11 DIAGNOSIS — M25512 Pain in left shoulder: Secondary | ICD-10-CM | POA: Diagnosis not present

## 2021-01-11 MED ORDER — SUTAB 1479-225-188 MG PO TABS
376.0000 mg | ORAL_TABLET | ORAL | 0 refills | Status: DC
  Filled 2021-01-11: qty 24, 1d supply, fill #0

## 2021-01-11 NOTE — Telephone Encounter (Signed)
Pt needed instructions resent to MyChart and Rx sent to [facility] outpatient pharmacy.

## 2021-01-11 NOTE — Therapy (Signed)
[place] [facility] [address]. [place], [place], [address] Phone: [phone]   Fax:  [phone]  Physical Therapy Treatment  Patient Details  Name: Linda Vega MRN: [id number] Date of Birth: [birth year] Referring Provider (PT): Reche Dixon PA-C   Encounter Date: 01/11/2021   PT End of Session - 01/11/21 1726     Visit Number 6    Number of Visits 17    Date for PT Re-Evaluation 02/07/21    Authorization Type eval: 12/13/20    PT Start Time 1702    PT Stop Time 1745    PT Time Calculation (min) 43 min    Activity Tolerance Patient tolerated treatment well    Behavior During Therapy [facility] for tasks assessed/performed             Past Medical History:  Diagnosis Date   Anxiety    BRCA negative 12/2019   MyRisk neg except AXIN2 VUS   Carpal tunnel syndrome right   Family history of breast cancer 12/2019   IBIS=16.7%   GERD (gastroesophageal reflux disease)    Headache    h/o migraines   Hypertension     Past Surgical History:  Procedure Laterality Date   ABDOMINAL HYSTERECTOMY     BREAST BIOPSY Right 11/23/2017   US guided breast biopsy of 2:00 mass, coil marker, cellular fibroadenoma   BREAST BIOPSY Left 07/05/2020   [place] Bx, 3:00, Heart Clip   RF tag [id number].   BREAST BIOPSY Left 07/05/2020   [place] Bx, 5:00, X Clip  RF tag [id number]   BREAST CYST ASPIRATION Left    neg   BREAST EXCISIONAL BIOPSY Left 07/29/2020   BENIGN FIBROADENOMA   BREAST EXCISIONAL BIOPSY Left 07/29/2020   COMPLEX FIBROADENOMA   BREAST LUMPECTOMY WITH RADIOFREQUENCY TAG IDENTIFICATION Left 46/80/3212   Procedure: BREAST LUMPECTOMY WITH RADIOFREQUENCY TAG IDENTIFICATION, lumpectomy x 2;  Surgeon: Olean Ree, MD;  Location: [facility];  Service: General;  Laterality: Left;   CESAREAN SECTION     CYSTOSCOPY N/A 10/14/2019   Procedure: CYSTOSCOPY;  Surgeon: Homero Fellers, MD;  Location: [facility];  Service: Gynecology;   Laterality: N/A;   fibrocystic breast     IUD REMOVAL N/A 10/14/2019   Procedure: INTRAUTERINE DEVICE (IUD) REMOVAL;  Surgeon: Homero Fellers, MD;  Location: [facility];  Service: Gynecology;  Laterality: N/A;   LAPAROSCOPY FOR ECTOPIC PREGNANCY     TOTAL LAPAROSCOPIC HYSTERECTOMY WITH SALPINGECTOMY Right 10/14/2019   Procedure: TOTAL LAPAROSCOPIC HYSTERECTOMY WITH RIGHT SALPINGECTOMY;  Surgeon: Homero Fellers, MD;  Location: [facility];  Service: Gynecology;  Laterality: Right;    There were no vitals filed for this visit.   Subjective Assessment - 01/11/21 1724     Subjective Pt reports she is doing well today. States 2/10 pain in L upper trap. No specific questions/concerns.    Pertinent History Pt states that she "achy" nerve pain that radiates down her LUE to the wrist. Occasionally the L arm feels heavy/weak but these symptoms are brief. Her symptoms started after her COVID shot in July and she started having LUE symptoms in August. Symptoms start near the medial border of the L shoulder blade and radiate down the arm mostly to the wrist. She has experienced symptoms only once past her wrist. No improvement since onset and no issues in the RUE. She does have a history of R carpal tunnel. She reports a lot of tension in her lower neck as well as crepitus but  she denies any LUE symptoms with neck movement. Denies numbness/tingling in LUE. Started prednisone today. She notices that she doesn't experience issues if she lays on her L side but does have an increase in her L shoulder pain if she lays on her R side. Plain films of the cervical spine revealed C4-C5, C5-6, C6-7, and C7-T1 degenerative disc changes with significant C4-C5 and C5-6 anterior osteophyte formation. There is degenerative disc changes from C4-T1 with no subluxation. There is slightly decreased a lot of curve suggestive of muscle spasm. She has a history of chronic low back pain. Reports intermittent night sweats but denies  nausea, vomiting, chills, fever, unexplainable weight gain/loss, or personal history of cancer. Denies bruising, warmth, or swelling of the L shoulder.    Currently in Pain? Yes    Pain Score 2     Pain Location Shoulder    Pain Orientation Left    Pain Descriptors / Indicators Aching    Pain Type Chronic pain                 TREATMENT     Manual Therapy   L first rib mobilizations, grade III, 30s/bout x 2 bouts; L scalene, upper trap, and levator stretch 30s x 2 each; L shoulder distraction moving through passive L shoulder abduction for upper trap stretch and nerve mobility; L median nerve glides x 2 minutes; Manual L cervical traction 30s hold/20s relax x 3 bouts; Prone CPA C2-T3, 30s/bout x 2 bouts; Prone L UPA C2-C7, 30s/bout x 2 bouts;    Ther-ex  UBE 2 minutes forward/2 minutes backward (4 minutes total) for warm-up during history; Supine L serratus anterior punches with manual resistance 2 x 10; Supine rhythmic stabilization at 90* 2 x 30s;   Trigger Point Dry Needling (TDN), unbilled Education previously performed with patient regarding potential benefit of TDN. Previously reviewed precautions and risks with patient. Previously reviewed special precautions/risks over lung fields which include pneumothorax. Previously reviewed signs and symptoms of pneumothorax and advised pt to go to ER immediately if these symptoms develop advise them of dry needling treatment. Pt provided verbal consent to treatment. Using clean technique with pt in prone TDN performed to L upper trap with 3, 0.25 x 60 single needle placements with local twitch response (LTR). Pistoning technique utilized.     Pt educated throughout session about proper posture and technique with exercises. Improved exercise technique, movement at target joints, use of target muscles after min to mod verbal, visual, tactile cues.     Pt demonstrates excellent motivation during today's session. Performed TDN in L  upper trap to alleviate pain upon initial presentation. Repeated manual techniques for L upper quarter pain today and continued with L upper quarter strengthening. Patient encouraged follow-up as scheduled. Pt will return for one session/week as she works toward discharge if pain remains improved. She will benefit from skilled PT services to address deficits in left upper extremity pain and return to full pain-free function at home and work.             PT Short Term Goals - 12/14/20 1509       PT SHORT TERM GOAL #1   Title Pt will be independent with HEP in order to improve strength and decrease neck/shoulder/LUE pain in order to improve pain-free function at home and work.    Time 4    Period Weeks    Status New    Target Date 01/10/21  PT Long Term Goals - 12/14/20 1509       PT LONG TERM GOAL #1   Title Pt will decrease worst neck/L shoulder/LUE pain as reported on NPRS by at least 3 points in order to demonstrate clinically significant reduction in pain.    Baseline 12/13/20: worst: 8/10    Time 8    Period Weeks    Status New    Target Date 02/07/21      PT LONG TERM GOAL #2   Title Pt will decrease quick DASH score by at least 8% in order to demonstrate clinically significant reduction in disability.    Baseline 12/13/20: pt to complete at next visit    Time 8    Period Weeks    Status New    Target Date 02/07/21      PT LONG TERM GOAL #3   Title Pt will report at least 75% improvement in her symptoms in order to resume full function at home and work with less pain    Time 8    Period Weeks    Status New    Target Date 02/08/21                   Plan - 01/11/21 1726     Clinical Impression Statement Pt demonstrates excellent motivation during today's session. Performed TDN in L upper trap to alleviate pain upon initial presentation. Repeated manual techniques for L upper quarter pain today and continued with L upper quarter  strengthening. Patient encouraged follow-up as scheduled. Pt will return for one session/week as she works toward discharge if pain remains improved. She will benefit from skilled PT services to address deficits in left upper extremity pain and return to full pain-free function at home and work.    Personal Factors and Comorbidities Comorbidity 3+;Time since onset of injury/illness/exacerbation    Comorbidities Migraine, carpal tunnel, OA, anxiety    Examination-Activity Limitations Other   Cross body reaching   Examination-Participation Restrictions Occupation    Stability/Clinical Decision Making Stable/Uncomplicated    Rehab Potential Excellent    PT Frequency 2x / week    PT Duration 8 weeks    PT Treatment/Interventions ADLs/Self Care Home Management;Aquatic Therapy;Biofeedback;Canalith Repostioning;Cryotherapy;Electrical Stimulation;Iontophoresis 32m/ml Dexamethasone;Moist Heat;Traction;Ultrasound;DME Instruction;Gait training;Stair training;Functional mobility training;Therapeutic activities;Therapeutic exercise;Balance training;Neuromuscular re-education;Patient/family education;Manual techniques;Passive range of motion;Dry needling;Vestibular;Spinal Manipulations;Joint Manipulations    PT Next Visit Plan median nerve glides, cervical traction, trigger point dry needling, strengthening    PT Home Exercise Plan Access Code: [email or web address]   Consulted and Agree with Plan of Care Patient             Patient will benefit from skilled therapeutic intervention in order to improve the following deficits and impairments:  Pain  Visit Diagnosis: Chronic left shoulder pain     Problem List Patient Active Problem List   Diagnosis Date Noted   Tingling 11/12/2020   Nerve pain 10/06/2020   Left arm pain 10/06/2020   Carpal tunnel syndrome of right wrist 10/06/2020   Fibroadenoma of breast, left    Abnormal uterine bleeding (AUB) 02/06/2020   Family history of breast cancer 01/20/2020    Osteoarthritis of spine with radiculopathy, lumbar region 07/16/2019   Low back pain 03/11/2018   Obesity (BMI 35.0-39.9 without comorbidity) 09/14/2017   Anemia associated with acute blood loss 08/27/2017   Snoring 08/05/2017   Vitamin D deficiency 08/03/2017   Migraine without aura and without status migrainosus, not intractable 04/12/2017   Iron deficiency anemia  secondary to inadequate dietary iron intake 09/03/2016   Essential hypertension with goal blood pressure less than 130/80 05/29/2015   Anxiety 03/22/2015   Positive PPD 06/04/2012   Genital herpes 02/07/2011   Lyndel Safe Cassell Voorhies PT, DPT, GCS Dalaney Needle 01/12/2021, 11:29 AM  [place] [address] [place], [place], [address] Phone: [phone]   Fax:  [phone]  Name: Linda Vega MRN: [id number] Date of Birth: 1976-07-05

## 2021-01-12 ENCOUNTER — Other Ambulatory Visit: Payer: Self-pay

## 2021-01-13 ENCOUNTER — Encounter: Payer: 59 | Admitting: Physical Therapy

## 2021-01-17 ENCOUNTER — Ambulatory Visit
Admission: RE | Admit: 2021-01-17 | Discharge: 2021-01-17 | Disposition: A | Discharge status: Home / Self Care | Payer: 59 | Attending: Gastroenterology | Admitting: Gastroenterology

## 2021-01-17 ENCOUNTER — Ambulatory Visit: Payer: 59 | Admitting: Anesthesiology

## 2021-01-17 ENCOUNTER — Encounter: Payer: Self-pay | Admitting: Gastroenterology

## 2021-01-17 ENCOUNTER — Other Ambulatory Visit: Payer: Self-pay

## 2021-01-17 ENCOUNTER — Ambulatory Visit
Admission: RE | Disposition: A | Discharge status: Home / Self Care | Payer: Self-pay | Source: Home / Self Care | Attending: Gastroenterology

## 2021-01-17 DIAGNOSIS — K6389 Other specified diseases of intestine: Secondary | ICD-10-CM | POA: Diagnosis not present

## 2021-01-17 DIAGNOSIS — Z1211 Encounter for screening for malignant neoplasm of colon: Secondary | ICD-10-CM | POA: Diagnosis not present

## 2021-01-17 DIAGNOSIS — K640 First degree hemorrhoids: Secondary | ICD-10-CM | POA: Insufficient documentation

## 2021-01-17 DIAGNOSIS — S36899A Unspecified injury of other intra-abdominal organs, initial encounter: Secondary | ICD-10-CM

## 2021-01-17 DIAGNOSIS — Z79899 Other long term (current) drug therapy: Secondary | ICD-10-CM | POA: Diagnosis not present

## 2021-01-17 DIAGNOSIS — Z803 Family history of malignant neoplasm of breast: Secondary | ICD-10-CM | POA: Diagnosis not present

## 2021-01-17 DIAGNOSIS — M79622 Pain in left upper arm: Secondary | ICD-10-CM

## 2021-01-17 DIAGNOSIS — S46912A Strain of unspecified muscle, fascia and tendon at shoulder and upper arm level, left arm, initial encounter: Secondary | ICD-10-CM

## 2021-01-17 HISTORY — PX: COLONOSCOPY WITH PROPOFOL: SHX5780

## 2021-01-17 HISTORY — DX: Carpal tunnel syndrome, unspecified upper limb: G56.00

## 2021-01-17 SURGERY — COLONOSCOPY WITH PROPOFOL
Anesthesia: General | Site: Rectum

## 2021-01-17 MED ORDER — SODIUM CHLORIDE 0.9 % IV SOLN: INTRAVENOUS | Status: DC

## 2021-01-17 MED ORDER — PROPOFOL 10 MG/ML IV BOLUS
INTRAVENOUS | Status: DC | PRN
  Administered 2021-01-17 (×2): 30 mg via INTRAVENOUS
  Administered 2021-01-17 (×3): 50 mg via INTRAVENOUS
  Administered 2021-01-17 (×2): 30 mg via INTRAVENOUS

## 2021-01-17 MED ORDER — LACTATED RINGERS IV SOLN: INTRAVENOUS | Status: DC

## 2021-01-17 MED ORDER — STERILE WATER FOR IRRIGATION IR SOLN
Status: DC | PRN
  Administered 2021-01-17: 150 mL

## 2021-01-17 SURGICAL SUPPLY — 22 items

## 2021-01-17 NOTE — Anesthesia Procedure Notes (Addendum)
Date/Time: 01/17/2021 8:18 AM Performed by: Dionne Bucy, CRNA Pre-anesthesia Checklist: Patient identified, Emergency Drugs available, Suction available, Patient being monitored and Timeout performed Patient Re-evaluated:Patient Re-evaluated prior to induction Oxygen Delivery Method: Nasal cannula Placement Confirmation: positive ETCO2

## 2021-01-17 NOTE — Transfer of Care (Signed)
Immediate Anesthesia Transfer of Care Note  Patient: Linda Vega  Procedure(s) Performed: COLONOSCOPY WITH PROPOFOL (Rectum)  Patient Location: PACU  Anesthesia Type: General  Level of Consciousness: awake, alert  and patient cooperative  Airway and Oxygen Therapy: Patient Spontanous Breathing and Patient connected to supplemental oxygen  Post-op Assessment: Post-op Vital signs reviewed, Patient's Cardiovascular Status Stable, Respiratory Function Stable, Patent Airway and No signs of Nausea or vomiting  Post-op Vital Signs: Reviewed and stable  Complications: No notable events documented.

## 2021-01-17 NOTE — Anesthesia Preprocedure Evaluation (Signed)
Anesthesia Evaluation  Patient identified by MRN, date of birth, ID band Patient awake    Reviewed: Allergy & Precautions, NPO status , Patient's Chart, lab work & pertinent test results  Airway Mallampati: II  TM Distance: >3 FB Neck ROM: Full    Dental no notable dental hx.    Pulmonary neg pulmonary ROS,    Pulmonary exam normal        Cardiovascular hypertension, Pt. on medications Normal cardiovascular exam     Neuro/Psych  Headaches, Anxiety    GI/Hepatic Neg liver ROS, GERD  Controlled,  Endo/Other  negative endocrine ROS  Renal/GU negative Renal ROS     Musculoskeletal  (+) Arthritis , Osteoarthritis,    Abdominal (+) + obese,   Peds  Hematology negative hematology ROS (+)   Anesthesia Other Findings   Reproductive/Obstetrics                             Anesthesia Physical Anesthesia Plan  ASA: 2  Anesthesia Plan: General   Post-op Pain Management:    Induction: Intravenous  PONV Risk Score and Plan: 3 and Propofol infusion, TIVA and Treatment may vary due to age or medical condition  Airway Management Planned: Natural Airway and Nasal Cannula  Additional Equipment:   Intra-op Plan:   Post-operative Plan:   Informed Consent: I have reviewed the patients History and Physical, chart, labs and discussed the procedure including the risks, benefits and alternatives for the proposed anesthesia with the patient or authorized representative who has indicated his/her understanding and acceptance.     Dental advisory given  Plan Discussed with: CRNA  Anesthesia Plan Comments:        Anesthesia Quick Evaluation

## 2021-01-17 NOTE — Progress Notes (Signed)
Per Dr. Allen Norris needs a CT scan for a appendix protrusion.Abd and pelvis with contrast. Order the CT scan and called central scheduling Friday 01/28/2021 at 8:30am arrived at 8:15am. Nothing to eat or drink 4 hours before procedure. It is at our patient imaging. Address is [address], [place], [place] [address]. Phone number is [phone] Patient will need to pick up oral contrast before procedure.  Informed patient of these instructions and she verbalized understanding

## 2021-01-17 NOTE — Anesthesia Postprocedure Evaluation (Signed)
Anesthesia Post Note  Patient: Linda Vega  Procedure(s) Performed: COLONOSCOPY WITH PROPOFOL (Rectum)     Patient location during evaluation: PACU Anesthesia Type: General Level of consciousness: awake and alert Pain management: pain level controlled Vital Signs Assessment: post-procedure vital signs reviewed and stable Respiratory status: spontaneous breathing and nonlabored ventilation Cardiovascular status: blood pressure returned to baseline Postop Assessment: no apparent nausea or vomiting Anesthetic complications: no   No notable events documented.  Itzabella Sorrels [employer]

## 2021-01-17 NOTE — Op Note (Signed)
[facility] Gastroenterology Patient Name: Linda Vega Procedure Date: 01/17/2021 8:10 AM MRN: [id number] Account #: [id number] Date of Birth: 16-Jul-1976 Admit Type: Outpatient Age: 45 Room: [facility] OR ROOM 01 Gender: Female Note Status: Finalized Procedure:             Colonoscopy Indications:           Screening for colorectal malignant neoplasm Providers:             Lucilla Lame MD, MD Referring MD:          Olin Hauser (Referring MD) Medicines:             Propofol per Anesthesia Complications:         No immediate complications. Procedure:             Pre-Anesthesia Assessment:                        - Prior to the procedure, a History and Physical was                         performed, and patient medications and allergies were                         reviewed. The patient's tolerance of previous                         anesthesia was also reviewed. The risks and benefits                         of the procedure and the sedation options and risks                         were discussed with the patient. All questions were                         answered, and informed consent was obtained. Prior                         Anticoagulants: The patient has taken no previous                         anticoagulant or antiplatelet agents. ASA Grade                         Assessment: II - A patient with mild systemic disease.                         After reviewing the risks and benefits, the patient                         was deemed in satisfactory condition to undergo the                         procedure.                        After obtaining informed consent, the colonoscope was  passed under direct vision. Throughout the procedure,                         the patient's blood pressure, pulse, and oxygen                         saturations were monitored continuously. The was                         introduced through the anus  and advanced to the the                         cecum, identified by appendiceal orifice and ileocecal                         valve. The colonoscopy was performed without                         difficulty. The patient tolerated the procedure well. Findings:      The perianal and digital rectal examinations were normal.      Non-bleeding internal hemorrhoids were found during retroflexion. The       hemorrhoids were Grade I (internal hemorrhoids that do not prolapse).      Extrisic compression at the appendix. Impression:            - Non-bleeding internal hemorrhoids.                        - Extrisic compression at the appendix.                        - No specimens collected. Recommendation:        - Discharge patient to home.                        - Resume previous diet.                        - Continue present medications.                        - Repeat colonoscopy in 10 years for screening                         purposes.                        - CT scan of abdomin and pelvis. Procedure Code(s):     --- Professional ---                        [phone], Colonoscopy, flexible; diagnostic, including                         collection of specimen(s) by brushing or washing, when                         performed (separate procedure) Diagnosis Code(s):     --- Professional ---  Z12.11, Encounter for screening for malignant neoplasm                         of colon CPT copyright 2019 American Medical Association. All rights reserved. The codes documented in this report are preliminary and upon coder review may  be revised to meet current compliance requirements. Lucilla Lame MD, MD 01/17/2021 8:42:51 AM This report has been signed electronically. Number of Addenda: 0 Note Initiated On: 01/17/2021 8:10 AM Scope Withdrawal Time: 0 hours 7 minutes 50 seconds  Total Procedure Duration: 0 hours 16 minutes 6 seconds  Estimated Blood Loss:  Estimated blood loss: none.  Estimated blood loss: none.      [facility]

## 2021-01-17 NOTE — H&P (Signed)
Linda Lame, MD [facility] [address] [place], [place] [address] Phone: [phone] Fax : [phone]  Primary Care Physician:  Olin Hauser, DO Primary Gastroenterologist:  Dr. Allen Norris  Pre-Procedure History & Physical: HPI:  Linda Vega is a 45 y.o. female is here for a screening colonoscopy.   Past Medical History:  Diagnosis Date   Anxiety    BRCA negative 12/2019   MyRisk neg except AXIN2 VUS   Carpal tunnel syndrome right   Family history of breast cancer 12/2019   IBIS=16.7%   GERD (gastroesophageal reflux disease)    Headache    h/o migraines   Hypertension     Past Surgical History:  Procedure Laterality Date   ABDOMINAL HYSTERECTOMY     BREAST BIOPSY Right 11/23/2017   US guided breast biopsy of 2:00 mass, coil marker, cellular fibroadenoma   BREAST BIOPSY Left 07/05/2020   [place] Bx, 3:00, Heart Clip   RF tag [id number].   BREAST BIOPSY Left 07/05/2020   [place] Bx, 5:00, X Clip  RF tag [id number]   BREAST CYST ASPIRATION Left    neg   BREAST EXCISIONAL BIOPSY Left 07/29/2020   BENIGN FIBROADENOMA   BREAST EXCISIONAL BIOPSY Left 07/29/2020   COMPLEX FIBROADENOMA   BREAST LUMPECTOMY WITH RADIOFREQUENCY TAG IDENTIFICATION Left 32/44/0102   Procedure: BREAST LUMPECTOMY WITH RADIOFREQUENCY TAG IDENTIFICATION, lumpectomy x 2;  Surgeon: Olean Ree, MD;  Location: [facility] ORS;  Service: General;  Laterality: Left;   CESAREAN SECTION     CYSTOSCOPY N/A 10/14/2019   Procedure: CYSTOSCOPY;  Surgeon: Homero Fellers, MD;  Location: [facility] ORS;  Service: Gynecology;  Laterality: N/A;   fibrocystic breast     IUD REMOVAL N/A 10/14/2019   Procedure: INTRAUTERINE DEVICE (IUD) REMOVAL;  Surgeon: Homero Fellers, MD;  Location: [facility];  Service: Gynecology;  Laterality: N/A;   LAPAROSCOPY FOR ECTOPIC PREGNANCY     TOTAL LAPAROSCOPIC HYSTERECTOMY WITH SALPINGECTOMY Right 10/14/2019   Procedure: TOTAL LAPAROSCOPIC HYSTERECTOMY WITH RIGHT  SALPINGECTOMY;  Surgeon: Homero Fellers, MD;  Location: [facility] ORS;  Service: Gynecology;  Laterality: Right;    Prior to Admission medications   Medication Sig Start Date End Date Taking? Authorizing Provider  acetaminophen (TYLENOL) 500 MG tablet Take 2 tablets (1,000 mg total) by mouth every 6 (six) hours as needed for mild pain. 07/29/20  Yes Piscoya, Jose, MD  albuterol (VENTOLIN HFA) 108 (90 Base) MCG/ACT inhaler INHALE 2 PUFFS INTO THE LUNGS EVERY 4 (FOUR) HOURS AS NEEDED FOR WHEEZING, SHORTNESS OF BREATH, OR COUGH 08/06/20 08/06/21 Yes Karamalegos, Alexander J, DO  amLODipine (NORVASC) 10 MG tablet TAKE 1 TABLET BY MOUTH DAILY. 11/04/20  Yes Karamalegos, Devonne Doughty, DO  AUVI-Q 0.3 MG/0.3ML SOAJ injection Inject 0.3 mg into the muscle as needed for anaphylaxis.  07/29/19  Yes [provider]  B Complex-C-Folic Acid (STRESS FORMULA PO) Take 1 tablet by mouth daily. GoodBye Stress by OLLY   Yes [provider]  Biotin w/ Vitamins C & E (HAIR/SKIN/NAILS PO) Take by mouth.   Yes [provider]  chlorhexidine (PERIDEX) 0.12 % solution SMARTSIG:By Mouth 07/29/20  Yes [provider]  Cholecalciferol (VITAMIN D3) 125 MCG (5000 UT) CAPS Take 5,000 Units by mouth every other day.   Yes [provider]  fexofenadine (ALLEGRA) 180 MG tablet Take 180 mg by mouth daily as needed for allergies or rhinitis.   Yes [provider]  ibuprofen (ADVIL) 600 MG tablet TAKE 1 TABLET BY MOUTH EVERY 6 (  SIX) HOURS AS NEEDED. Patient taking differently: Take 600 mg by mouth every 6 (six) hours as needed for moderate pain. 04/30/20 04/30/21 Yes Karamalegos, Alexander J, DO  ibuprofen (ADVIL) 800 MG tablet TAKE 1 TABLET BY MOUTH EVERY 8 HOURS AS NEEDED FOR MODERATE PAIN. 07/29/20 07/29/21 Yes Piscoya, Jose, MD  OVER THE COUNTER MEDICATION Take 3 tablets by mouth daily. Dairy Relief   Yes [provider]  traMADol (ULTRAM) 50 MG tablet Take 1 tablet (50 mg  total) by mouth every 6 (six) hours as needed for Pain for up to 30 doses 12/02/20  Yes   diclofenac (VOLTAREN) 75 MG EC tablet Take 75 mg by mouth 2 (two) times daily. Patient not taking: Reported on 12/01/2020    [provider]  gabapentin (NEURONTIN) 300 MG capsule Take 1 capsule twice daily for 2 weeks, then take 1 capsule in the morning and 2 capsules at night. Patient not taking: Reported on 12/01/2020 11/09/20     ketoconazole (NIZORAL) 2 % cream Apply 1 application topically 2 (two) times daily as needed (skin irritation/rash.).  Patient not taking: Reported on 01/17/2021 12/14/17   [provider]  omeprazole (PRILOSEC) 20 MG capsule TAKE 1 CAPSULE (20 MG TOTAL) BY MOUTH DAILY BEFORE BREAKFAST. Patient not taking: Reported on 01/17/2021 01/22/20   Olin Hauser, DO  predniSONE (DELTASONE) 10 MG tablet 5,5,4,4,3,3,2,2,1,1 tab po tapering dose Patient not taking: Reported on 01/17/2021 12/02/20     Sodium Sulfate-Mag Sulfate-KCl (SUTAB) [phone] MG TABS Take 376 mg by mouth as directed. 01/11/21   Linda Lame, MD  valACYclovir (VALTREX) 1000 MG tablet TAKE 1/2 TABLET BY MOUTH 2 TIMES A DAY FOR 3 TO 5 DAYS AS NEEDED FOR FLARE 09/15/20 09/15/21  Olin Hauser, DO    Allergies as of 11/24/2020 - Review Complete 11/24/2020  Allergen Reaction Noted   Shellfish allergy Hives, Hypertension, Itching, Palpitations, Shortness Of Breath, Swelling, and Anaphylaxis 07/13/2011   Chicken allergy Other (See Comments) 02/11/2014   Eggs or egg-derived products Hives 08/11/2015    Family History  Problem Relation Age of Onset   Hypertension Mother    Breast cancer Maternal Aunt        21s   Breast cancer Maternal Grandmother 20   Breast cancer Maternal Aunt        50s    Social History   Socioeconomic History   Marital status: Significant Other    Spouse name: Not on file   Number of children: Not on file   Years of education: Not on file   Highest education  level: Not on file  Occupational History   Occupation: Office Rep/Admin    Comment: [employer]  Tobacco Use   Smoking status: Never   Smokeless tobacco: Never  Vaping Use   Vaping Use: Never used  Substance and Sexual Activity   Alcohol use: Yes    Comment: occasional, about 3x a month   Drug use: No   Sexual activity: Not Currently    Birth control/protection: None  Other Topics Concern   Not on file  Social History Narrative   ** Merged History Encounter **       Social Determinants of Health   Financial Resource Strain: Not on file  Food Insecurity: Not on file  Transportation Needs: Not on file  Physical Activity: Not on file  Stress: Not on file  Social Connections: Not on file  Intimate Partner Violence: Not on file    Review of Systems:  See HPI, otherwise negative ROS  Physical Exam: BP (!) 132/91   Pulse 94   Temp 97.9 F (36.6 C) (Temporal)   Ht 5' 6"  (1.676 m)   Wt 106.6 kg   LMP 10/04/2019 (Exact Date)   SpO2 97%   BMI 37.93 kg/m  General:   Alert,  pleasant and cooperative in NAD Head:  Normocephalic and atraumatic. Neck:  Supple; no masses or thyromegaly. Lungs:  Clear throughout to auscultation.    Heart:  Regular rate and rhythm. Abdomen:  Soft, nontender and nondistended. Normal bowel sounds, without guarding, and without rebound.   Neurologic:  Alert and  oriented x4;  grossly normal neurologically.  Impression/Plan: Linda Vega is now here to undergo a screening colonoscopy.  Risks, benefits, and alternatives regarding colonoscopy have been reviewed with the patient.  Questions have been answered.  All parties agreeable.

## 2021-01-18 ENCOUNTER — Encounter: Payer: 59 | Admitting: Physical Therapy

## 2021-01-18 ENCOUNTER — Encounter: Payer: Self-pay | Admitting: Gastroenterology

## 2021-01-19 ENCOUNTER — Ambulatory Visit: Payer: 59 | Admitting: Family Medicine

## 2021-01-20 ENCOUNTER — Ambulatory Visit: Payer: 59

## 2021-01-20 ENCOUNTER — Other Ambulatory Visit: Payer: Self-pay

## 2021-01-20 ENCOUNTER — Encounter: Payer: 59 | Admitting: Physical Therapy

## 2021-01-20 DIAGNOSIS — G8929 Other chronic pain: Secondary | ICD-10-CM | POA: Diagnosis not present

## 2021-01-20 DIAGNOSIS — M25512 Pain in left shoulder: Secondary | ICD-10-CM | POA: Diagnosis not present

## 2021-01-20 NOTE — Therapy (Signed)
[facility] [address]. [place], [place], [address] Phone: [phone]   Fax:  [phone]  Physical Therapy Treatment/Discharge  Patient Details  Name: Linda Vega MRN: [id number] Date of Birth: [birth year] Referring Provider (PT): Reche Dixon PA-C   Encounter Date: 01/20/2021   PT End of Session - 01/21/21 1058     Visit Number 7    Number of Visits 17    Date for PT Re-Evaluation 02/07/21    Authorization Type eval: 12/13/20    PT Start Time 1704    PT Stop Time 1746    PT Time Calculation (min) 42 min    Activity Tolerance Patient tolerated treatment well    Behavior During Therapy [facility] for tasks assessed/performed             Past Medical History:  Diagnosis Date   Anxiety    BRCA negative 12/2019   MyRisk neg except AXIN2 VUS   Carpal tunnel syndrome right   Family history of breast cancer 12/2019   IBIS=16.7%   GERD (gastroesophageal reflux disease)    Headache    h/o migraines   Hypertension     Past Surgical History:  Procedure Laterality Date   ABDOMINAL HYSTERECTOMY     BREAST BIOPSY Right 11/23/2017   US guided breast biopsy of 2:00 mass, coil marker, cellular fibroadenoma   BREAST BIOPSY Left 07/05/2020   [place] Bx, 3:00, Heart Clip   RF tag [id number].   BREAST BIOPSY Left 07/05/2020   [place] Bx, 5:00, X Clip  RF tag [id number]   BREAST CYST ASPIRATION Left    neg   BREAST EXCISIONAL BIOPSY Left 07/29/2020   BENIGN FIBROADENOMA   BREAST EXCISIONAL BIOPSY Left 07/29/2020   COMPLEX FIBROADENOMA   BREAST LUMPECTOMY WITH RADIOFREQUENCY TAG IDENTIFICATION Left 53/66/4403   Procedure: BREAST LUMPECTOMY WITH RADIOFREQUENCY TAG IDENTIFICATION, lumpectomy x 2;  Surgeon: Olean Ree, MD;  Location: [facility];  Service: General;  Laterality: Left;   CESAREAN SECTION     COLONOSCOPY WITH PROPOFOL N/A 01/17/2021   Procedure: COLONOSCOPY WITH PROPOFOL;  Surgeon: Lucilla Lame, MD;  Location: [place];  Service: Endoscopy;  Laterality: N/A;   CYSTOSCOPY N/A 10/14/2019   Procedure: CYSTOSCOPY;  Surgeon: Homero Fellers, MD;  Location: [facility];  Service: Gynecology;  Laterality: N/A;   fibrocystic breast     IUD REMOVAL N/A 10/14/2019   Procedure: INTRAUTERINE DEVICE (IUD) REMOVAL;  Surgeon: Homero Fellers, MD;  Location: [facility];  Service: Gynecology;  Laterality: N/A;   LAPAROSCOPY FOR ECTOPIC PREGNANCY     TOTAL LAPAROSCOPIC HYSTERECTOMY WITH SALPINGECTOMY Right 10/14/2019   Procedure: TOTAL LAPAROSCOPIC HYSTERECTOMY WITH RIGHT SALPINGECTOMY;  Surgeon: Homero Fellers, MD;  Location: [facility];  Service: Gynecology;  Laterality: Right;    There were no vitals filed for this visit.   Subjective Assessment - 01/20/21 1757     Subjective Pt reports she is doing well today. Denies L shoulder pain upon arrival.  No specific questions/concerns.    Pertinent History Pt states that she "achy" nerve pain that radiates down her LUE to the wrist. Occasionally the L arm feels heavy/weak but these symptoms are brief. Her symptoms started after her COVID shot in July and she started having LUE symptoms in August. Symptoms start near the medial border of the L shoulder blade and radiate down the arm mostly to the wrist. She has experienced symptoms only once past her wrist. No improvement since onset and  no issues in the RUE. She does have a history of R carpal tunnel. She reports a lot of tension in her lower neck as well as crepitus but she denies any LUE symptoms with neck movement. Denies numbness/tingling in LUE. Started prednisone today. She notices that she doesn't experience issues if she lays on her L side but does have an increase in her L shoulder pain if she lays on her R side. Plain films of the cervical spine revealed C4-C5, C5-6, C6-7, and C7-T1 degenerative disc changes with significant C4-C5 and C5-6 anterior osteophyte formation. There is degenerative disc changes  from C4-T1 with no subluxation. There is slightly decreased a lot of curve suggestive of muscle spasm. She has a history of chronic low back pain. Reports intermittent night sweats but denies nausea, vomiting, chills, fever, unexplainable weight gain/loss, or personal history of cancer. Denies bruising, warmth, or swelling of the L shoulder.    Diagnostic tests Cervical plain films: see history    Patient Stated Goals Decrease symptoms    Currently in Pain? No/denies    Pain Onset --                 TREATMENT     Manual Therapy   UBE 2 minutes forward/2 minutes backward (4 minutes total) for warm-up during history; Updated goals with patient: Worst pain: 5/10, one episode over the last week, lasted less than 5 minutes; Percent improvement: 97%; QuickDASH: 9.1%; L first rib mobilizations, grade III, 30s/bout x 2 bouts; L scalene, upper trap, and levator stretch 30s x 2 each; L shoulder distraction moving through passive L shoulder abduction for upper trap stretch and nerve mobility; L median nerve glides x 2 minutes;     Pt educated throughout session about proper posture and technique with exercises. Improved exercise technique, movement at target joints, use of target muscles after min to mod verbal, visual, tactile cues.      Updated outcome measures and goals with patient today. Her QuickDASH improved from 34.1% initially to 9.1% today. She only had one episode of L shoulder pain this week which was a 5/10 and lasted for less than 5 minutes. Overall she reports 97% improvement in her symptoms since starting therapy. Repeated manual techniques for L upper quarter today and progressed HEP. Pt will be discharged at this time having met all of her goals.                            PT Short Term Goals - 01/21/21 1059       PT SHORT TERM GOAL #1   Title Pt will be independent with HEP in order to improve strength and decrease neck/shoulder/LUE pain in order  to improve pain-free function at home and work.    Time 4    Period Weeks    Status Achieved    Target Date 01/10/21               PT Long Term Goals - 01/21/21 1059       PT LONG TERM GOAL #1   Title Pt will decrease worst neck/L shoulder/LUE pain as reported on NPRS by at least 3 points in order to demonstrate clinically significant reduction in pain.    Baseline 12/13/20: worst: 8/10; 01/21/21: worts: 5/10, only one episode over the last week which lasted less than 5 minutes    Time 8    Period Weeks    Status Achieved  PT LONG TERM GOAL #2   Title Pt will decrease quick DASH score by at least 8% in order to demonstrate clinically significant reduction in disability.    Baseline 12/13/20: pt to complete at next visit; 12/16/20: 34.1%, 01/20/21: 9.1% (low disability)    Time 8    Period Weeks    Status Achieved      PT LONG TERM GOAL #3   Title Pt will report at least 75% improvement in her symptoms in order to resume full function at home and work with less pain    Baseline 01/20/21: 97% improved;    Time 8    Period Weeks    Status Achieved                   Plan - 01/20/21 1742     Clinical Impression Statement Updated outcome measures and goals with patient today. Her QuickDASH improved from 34.1% initially to 9.1% today. She only had one episode of L shoulder pain this week which was a 5/10 and lasted for less than 5 minutes. Overall she reports 97% improvement in her symptoms since starting therapy. Repeated manual techniques for L upper quarter today and progressed HEP. Pt will be discharged at this time having met all of her goals.    Personal Factors and Comorbidities Comorbidity 3+;Time since onset of injury/illness/exacerbation    Comorbidities Migraine, carpal tunnel, OA, anxiety    Examination-Activity Limitations Other   Cross body reaching   Examination-Participation Restrictions Occupation    Stability/Clinical Decision Making  Stable/Uncomplicated    Rehab Potential Excellent    PT Frequency 2x / week    PT Duration 8 weeks    PT Treatment/Interventions ADLs/Self Care Home Management;Aquatic Therapy;Biofeedback;Canalith Repostioning;Cryotherapy;Electrical Stimulation;Iontophoresis 68m/ml Dexamethasone;Moist Heat;Traction;Ultrasound;DME Instruction;Gait training;Stair training;Functional mobility training;Therapeutic activities;Therapeutic exercise;Balance training;Neuromuscular re-education;Patient/family education;Manual techniques;Passive range of motion;Dry needling;Vestibular;Spinal Manipulations;Joint Manipulations    PT Next Visit Plan median nerve glides, cervical traction, trigger point dry needling, strengthening    PT Home Exercise Plan Access Code: [email or web address]   Consulted and Agree with Plan of Care Patient             Patient will benefit from skilled therapeutic intervention in order to improve the following deficits and impairments:  Pain  Visit Diagnosis: Chronic left shoulder pain     Problem List Patient Active Problem List   Diagnosis Date Noted   Encounter for screening colonoscopy    Tingling 11/12/2020   Nerve pain 10/06/2020   Left arm pain 10/06/2020   Carpal tunnel syndrome of right wrist 10/06/2020   Fibroadenoma of breast, left    Abnormal uterine bleeding (AUB) 02/06/2020   Family history of breast cancer 01/20/2020   Osteoarthritis of spine with radiculopathy, lumbar region 07/16/2019   Low back pain 03/11/2018   Obesity (BMI 35.0-39.9 without comorbidity) 09/14/2017   Anemia associated with acute blood loss 08/27/2017   Snoring 08/05/2017   Vitamin D deficiency 08/03/2017   Migraine without aura and without status migrainosus, not intractable 04/12/2017   Iron deficiency anemia secondary to inadequate dietary iron intake 09/03/2016   Essential hypertension with goal blood pressure less than 130/80 05/29/2015   Anxiety 03/22/2015   Positive PPD 06/04/2012   Genital  herpes 02/07/2011    JLyndel SafeHuprich PT, DPT, GCS  Dafina Suk 01/21/2021, 11:08 AM  [facility] [place] [address]Phone: [phone]  Fax:  [phone] Name: ANATALYAH CUMMISKEYMRN: [id number]  Date of Birth: [birth year]

## 2021-01-20 NOTE — Patient Instructions (Signed)
Access Code: [email or web address] URL: [email or web address] Date: 01/20/2021 Prepared by: Roxana Hires  Exercises Median Nerve Flossing - Tray - 3 x daily - 7 x weekly - 2 sets - 10 reps - 3s hold Seated Cervical Sidebending Stretch (Mirrored) - 3 x daily - 7 x weekly - 3 reps - 30s hold Seated Rhomboid Stretch - 3 x daily - 7 x weekly - 3 reps - 30s hold Seated Shoulder Row with Anchored Resistance - 1 x daily - 3 x weekly - 2 sets - 10 reps - 3s hold Standing Shoulder Flexion with Resistance (Mirrored) - 1 x daily - 3 x weekly - 2 sets - 10 reps - 3s hold Standing Single Arm Shoulder Abduction with Resistance - 1 x daily - 3 x weekly - 2 sets - 10 reps - 3s hold

## 2021-01-25 ENCOUNTER — Encounter: Payer: 59 | Admitting: Physical Therapy

## 2021-01-28 ENCOUNTER — Ambulatory Visit
Admission: RE | Admit: 2021-01-28 | Discharge: 2021-01-28 | Disposition: A | Discharge status: Home / Self Care | Payer: 59 | Source: Ambulatory Visit | Attending: Gastroenterology | Admitting: Gastroenterology

## 2021-01-28 ENCOUNTER — Other Ambulatory Visit: Payer: Self-pay

## 2021-01-28 ENCOUNTER — Other Ambulatory Visit: Payer: Self-pay | Admitting: Family Medicine

## 2021-01-28 ENCOUNTER — Other Ambulatory Visit: Payer: 59

## 2021-01-28 DIAGNOSIS — Z0000 Encounter for general adult medical examination without abnormal findings: Secondary | ICD-10-CM

## 2021-01-28 DIAGNOSIS — I10 Essential (primary) hypertension: Secondary | ICD-10-CM

## 2021-01-28 DIAGNOSIS — E7800 Pure hypercholesterolemia, unspecified: Secondary | ICD-10-CM

## 2021-01-28 DIAGNOSIS — R7309 Other abnormal glucose: Secondary | ICD-10-CM | POA: Diagnosis not present

## 2021-01-28 DIAGNOSIS — N83202 Unspecified ovarian cyst, left side: Secondary | ICD-10-CM | POA: Diagnosis not present

## 2021-01-28 DIAGNOSIS — M48061 Spinal stenosis, lumbar region without neurogenic claudication: Secondary | ICD-10-CM | POA: Diagnosis not present

## 2021-01-28 DIAGNOSIS — S36899A Unspecified injury of other intra-abdominal organs, initial encounter: Secondary | ICD-10-CM | POA: Diagnosis not present

## 2021-01-28 DIAGNOSIS — M5137 Other intervertebral disc degeneration, lumbosacral region: Secondary | ICD-10-CM | POA: Diagnosis not present

## 2021-01-28 DIAGNOSIS — M4317 Spondylolisthesis, lumbosacral region: Secondary | ICD-10-CM | POA: Diagnosis not present

## 2021-01-28 LAB — POCT I-STAT CREATININE: Creatinine, Ser: 0.9 mg/dL (ref 0.44–1.00)

## 2021-01-28 MED ORDER — IOHEXOL 300 MG/ML  SOLN
100.0000 mL | Freq: Once | INTRAMUSCULAR | Status: AC | PRN
  Administered 2021-01-28: 100 mL via INTRAVENOUS

## 2021-01-28 NOTE — Progress Notes (Unsigned)
cbc

## 2021-01-29 LAB — CBC WITH DIFFERENTIAL/PLATELET
Absolute Monocytes: 410 cells/uL (ref 200–950)
Basophils Absolute: 59 cells/uL (ref 0–200)
Basophils Relative: 1.5 %
Eosinophils Absolute: 31 cells/uL (ref 15–500)
Eosinophils Relative: 0.8 %
HCT: 40.3 % (ref 35.0–45.0)
Hemoglobin: 12.5 g/dL (ref 11.7–15.5)
Lymphs Abs: 1552 cells/uL (ref 850–3900)
MCH: 22.6 pg — ABNORMAL LOW (ref 27.0–33.0)
MCHC: 31 g/dL — ABNORMAL LOW (ref 32.0–36.0)
MCV: 73 fL — ABNORMAL LOW (ref 80.0–100.0)
MPV: 10.8 fL (ref 7.5–12.5)
Monocytes Relative: 10.5 %
Neutro Abs: 1849 cells/uL (ref 1500–7800)
Neutrophils Relative %: 47.4 %
Platelets: 261 10*3/uL (ref 140–400)
RBC: 5.52 10*6/uL — ABNORMAL HIGH (ref 3.80–5.10)
RDW: 16.1 % — ABNORMAL HIGH (ref 11.0–15.0)
Total Lymphocyte: 39.8 %
WBC: 3.9 10*3/uL (ref 3.8–10.8)

## 2021-01-29 LAB — LIPID PANEL
Cholesterol: 191 mg/dL (ref <200)
HDL: 50 mg/dL (ref >=50)
LDL Cholesterol (Calc): 124 mg/dL (calc) — ABNORMAL HIGH
Non-HDL Cholesterol (Calc): 141 mg/dL (calc) — ABNORMAL HIGH (ref <130)
Total CHOL/HDL Ratio: 3.8 (calc) (ref <5.0)
Triglycerides: 74 mg/dL (ref <150)

## 2021-01-29 LAB — COMPREHENSIVE METABOLIC PANEL
AG Ratio: 1.5 (calc) (ref 1.0–2.5)
ALT: 14 U/L (ref 6–29)
AST: 15 U/L (ref 10–35)
Albumin: 4 g/dL (ref 3.6–5.1)
Alkaline phosphatase (APISO): 62 U/L (ref 31–125)
BUN: 8 mg/dL (ref 7–25)
CO2: 26 mmol/L (ref 20–32)
Calcium: 9.6 mg/dL (ref 8.6–10.2)
Chloride: 102 mmol/L (ref 98–110)
Creat: 0.88 mg/dL (ref 0.50–1.10)
Globulin: 2.6 g/dL (calc) (ref 1.9–3.7)
Glucose, Bld: 85 mg/dL (ref 65–99)
Potassium: 4.1 mmol/L (ref 3.5–5.3)
Sodium: 136 mmol/L (ref 135–146)
Total Bilirubin: 0.6 mg/dL (ref 0.2–1.2)
Total Protein: 6.6 g/dL (ref 6.1–8.1)

## 2021-01-29 LAB — HEMOGLOBIN A1C
Hgb A1c MFr Bld: 5.8 % of total Hgb — ABNORMAL HIGH (ref <5.7)
Mean Plasma Glucose: 120 mg/dL
eAG (mmol/L): 6.6 mmol/L

## 2021-01-29 LAB — TSH: TSH: 1.33 mIU/L

## 2021-02-01 ENCOUNTER — Other Ambulatory Visit: Payer: Self-pay

## 2021-02-01 ENCOUNTER — Ambulatory Visit (INDEPENDENT_AMBULATORY_CARE_PROVIDER_SITE_OTHER): Payer: 59 | Admitting: Family Medicine

## 2021-02-01 ENCOUNTER — Telehealth: Payer: Self-pay

## 2021-02-01 ENCOUNTER — Encounter: Payer: Self-pay | Admitting: Family Medicine

## 2021-02-01 VITALS — BP 136/88 | HR 90 | Ht 66.0 in | Wt 240.2 lb

## 2021-02-01 DIAGNOSIS — D508 Other iron deficiency anemias: Secondary | ICD-10-CM

## 2021-02-01 DIAGNOSIS — E669 Obesity, unspecified: Secondary | ICD-10-CM

## 2021-02-01 DIAGNOSIS — I10 Essential (primary) hypertension: Secondary | ICD-10-CM | POA: Diagnosis not present

## 2021-02-01 DIAGNOSIS — R7309 Other abnormal glucose: Secondary | ICD-10-CM | POA: Insufficient documentation

## 2021-02-01 DIAGNOSIS — K219 Gastro-esophageal reflux disease without esophagitis: Secondary | ICD-10-CM | POA: Diagnosis not present

## 2021-02-01 DIAGNOSIS — E7800 Pure hypercholesterolemia, unspecified: Secondary | ICD-10-CM

## 2021-02-01 DIAGNOSIS — Z0000 Encounter for general adult medical examination without abnormal findings: Secondary | ICD-10-CM

## 2021-02-01 MED ORDER — AMLODIPINE BESYLATE 10 MG PO TABS
10.0000 mg | ORAL_TABLET | Freq: Every day | ORAL | 3 refills | Status: DC
  Filled 2021-02-01: qty 90, 90d supply, fill #0
  Filled 2021-05-09: qty 90, 90d supply, fill #1
  Filled 2021-08-11: qty 90, 90d supply, fill #2
  Filled 2021-11-09: qty 90, 90d supply, fill #3

## 2021-02-01 MED ORDER — OMEPRAZOLE 20 MG PO CPDR
20.0000 mg | DELAYED_RELEASE_CAPSULE | Freq: Every day | ORAL | 3 refills | Status: DC
  Filled 2021-02-01 – 2021-02-21 (×2): qty 90, 90d supply, fill #0
  Filled 2021-05-09: qty 90, 90d supply, fill #1

## 2021-02-01 NOTE — Telephone Encounter (Signed)
-----   Message from Lucilla Lame, MD sent at 02/01/2021  7:50 AM EDT ----- Please bring this patient in this week so that we can discuss the CT scan findings.

## 2021-02-01 NOTE — Telephone Encounter (Signed)
Pt scheduled for a follow up with Dr. Allen Norris on 02/02/21 in [place].

## 2021-02-01 NOTE — Progress Notes (Signed)
Subjective:    Patient ID: Linda Vega, female    DOB: 01-17-1976, 45 y.o.   MRN: [id number]  Linda Vega is a 45 y.o. female presenting on 02/01/2021 for Annual Exam and Back Pain   HPI  Here for Annual Physical and Lab Review.   Wellness / Lifestyle She is motivated to work out more now and work on wt loss. Exercise - back to gym, more regular. Walking with incline and running as well, 30 min Diet - improving healthier choices   Anxiety Improved OTC Olly Stress supplement She plans to do some therapy No longer on anxiety med Lexapro   CHRONIC HTN: Checking BP at home overall improved results Current Meds - Amlodipine 77m - Previously on Propranolol for migraine prophylaxis has been off of this for while now Reports good compliance, took meds today. Tolerating well, w/o complaints.  Pre-Diabetes Reports no concerns Prior result A1c 5.8, stable from 1 year ago. Meds: none Lifestyle: - Diet (goal to improve)  Denies hypoglycemia, polyuria, visual changes, numbness or tingling.   Vitamin D Deficiency   History of low MCV / Anemia Reports prior history of heavier periods, had IUD then this caused other menstrual irregularity, she has had history of lower Hgb in 11 in past. Recent lab CBC showed Hgb 12 range, still low MCV. Asymptomatic. Bleeding seems stable with periods now.   HYPERLIPIDEMIA / Obesity BMI >38 Prior mild elevated LDL. Due for lipids and fasting blood work. Not on medicine Lifestyle: - Diet: goal for low salt diet, inc water intake, inc fruits / veggies - Exercise: plans to increase regular exercise now, cardio exercise    Health Maintenance:   Breast CA Screening: Last mammogram 12/08/20 - negative birads 1 repeat 1 year. Prior dx Fibrocystic disease and dense tissue, no other abnormal. Maternal aunts x 2, age 47-50s treated, maternal grandmother breast cancer. Currently asymptomatic. Mammogram done 12/08/19 - negative. See below    Colonoscopy 01/17/21 no polyps, repeat 10 year.   Cervical CA Screening - Last Pap smear done 10/16/16 per [facility] PCP, negative for abnormal or malignant cells and negative HPV co testing - S/p Hysterectomy. [facility] now no longer needs pap smears due to hysterectomy.   [facility] 02/01/2021 01/14/2020 09/12/2019  Decreased Interest 0 0 0  Down, Depressed, Hopeless 0 0 0  PHQ - 2 Score 0 0 0  Altered sleeping - - 0  Tired, decreased energy - - 0  Change in appetite - - 0  Feeling bad or failure about yourself  - - 0  Trouble concentrating - - 1  Moving slowly or fidgety/restless - - 0  Suicidal thoughts - - 0  PHQ-9 Score - - 1  Difficult doing work/chores - - Very difficult    Past Medical History:  Diagnosis Date   Anxiety    BRCA negative 12/2019   MyRisk neg except AXIN2 VUS   Carpal tunnel syndrome right   Family history of breast cancer 12/2019   IBIS=16.7%   GERD (gastroesophageal reflux disease)    Headache    h/o migraines   Hypertension    Past Surgical History:  Procedure Laterality Date   ABDOMINAL HYSTERECTOMY     BREAST BIOPSY Right 11/23/2017   [place] breast biopsy of 2:00 mass, coil marker, cellular fibroadenoma   BREAST BIOPSY Left 07/05/2020   [place], 3:00, Heart Clip   RF tag [id number].   BREAST BIOPSY Left  07/05/2020   [place] Bx, 5:00, X Clip  RF tag [id number]   BREAST CYST ASPIRATION Left    neg   BREAST EXCISIONAL BIOPSY Left 07/29/2020   BENIGN FIBROADENOMA   BREAST EXCISIONAL BIOPSY Left 07/29/2020   COMPLEX FIBROADENOMA   BREAST LUMPECTOMY WITH RADIOFREQUENCY TAG IDENTIFICATION Left 27/78/2423   Procedure: BREAST LUMPECTOMY WITH RADIOFREQUENCY TAG IDENTIFICATION, lumpectomy x 2;  Surgeon: Olean Ree, MD;  Location: [facility];  Service: General;  Laterality: Left;   CESAREAN SECTION     COLONOSCOPY WITH PROPOFOL N/A 01/17/2021   Procedure: COLONOSCOPY WITH PROPOFOL;  Surgeon: Lucilla Lame,  MD;  Location: [facility];  Service: Endoscopy;  Laterality: N/A;   CYSTOSCOPY N/A 10/14/2019   Procedure: CYSTOSCOPY;  Surgeon: Homero Fellers, MD;  Location: [facility];  Service: Gynecology;  Laterality: N/A;   fibrocystic breast     IUD REMOVAL N/A 10/14/2019   Procedure: INTRAUTERINE DEVICE (IUD) REMOVAL;  Surgeon: Homero Fellers, MD;  Location: [facility];  Service: Gynecology;  Laterality: N/A;   LAPAROSCOPY FOR ECTOPIC PREGNANCY     TOTAL LAPAROSCOPIC HYSTERECTOMY WITH SALPINGECTOMY Right 10/14/2019   Procedure: TOTAL LAPAROSCOPIC HYSTERECTOMY WITH RIGHT SALPINGECTOMY;  Surgeon: Homero Fellers, MD;  Location: [facility];  Service: Gynecology;  Laterality: Right;   Social History   Socioeconomic History   Marital status: Significant Other    Spouse name: Not on file   Number of children: Not on file   Years of education: Not on file   Highest education level: Not on file  Occupational History   Occupation: Office Rep/Admin    Comment: [employer]  Tobacco Use   Smoking status: Never   Smokeless tobacco: Never  Vaping Use   Vaping Use: Never used  Substance and Sexual Activity   Alcohol use: Yes    Comment: occasional, about 3x a month   Drug use: No   Sexual activity: Not Currently    Birth control/protection: None  Other Topics Concern   Not on file  Social History Narrative   ** Merged History Encounter **       Social Determinants of Health   Financial Resource Strain: Not on file  Food Insecurity: Not on file  Transportation Needs: Not on file  Physical Activity: Not on file  Stress: Not on file  Social Connections: Not on file  Intimate Partner Violence: Not on file   Family History  Problem Relation Age of Onset   Hypertension Mother    Breast cancer Maternal Aunt        66s   Breast cancer Maternal Grandmother 20   Breast cancer Maternal Aunt        5s   Current Outpatient Medications on File Prior to Visit   Medication Sig   acetaminophen (TYLENOL) 500 MG tablet Take 2 tablets (1,000 mg total) by mouth every 6 (six) hours as needed for mild pain.   AUVI-Q 0.3 MG/0.3ML SOAJ injection Inject 0.3 mg into the muscle as needed for anaphylaxis.    B Complex-C-Folic Acid (STRESS FORMULA PO) Take 1 tablet by mouth daily. GoodBye Stress by OLLY   Biotin w/ Vitamins C & E (HAIR/SKIN/NAILS PO) Take by mouth.   chlorhexidine (PERIDEX) 0.12 % solution SMARTSIG:By Mouth   Cholecalciferol (VITAMIN D3) 125 MCG (5000 UT) CAPS Take 5,000 Units by mouth every other day.   diclofenac (VOLTAREN) 75 MG EC tablet Take 75 mg by mouth 2 (two) times daily.   ibuprofen (ADVIL) 800 MG tablet  TAKE 1 TABLET BY MOUTH EVERY 8 HOURS AS NEEDED FOR MODERATE PAIN.   ketoconazole (NIZORAL) 2 % cream Apply 1 application topically 2 (two) times daily as needed (skin irritation/rash.).    valACYclovir (VALTREX) 1000 MG tablet TAKE 1/2 TABLET BY MOUTH 2 TIMES A DAY FOR 3 TO 5 DAYS AS NEEDED FOR FLARE   albuterol (VENTOLIN HFA) 108 (90 Base) MCG/ACT inhaler INHALE 2 PUFFS INTO THE LUNGS EVERY 4 (FOUR) HOURS AS NEEDED FOR WHEEZING, SHORTNESS OF BREATH, OR COUGH   fexofenadine (ALLEGRA) 180 MG tablet Take 180 mg by mouth daily as needed for allergies or rhinitis.   ibuprofen (ADVIL) 600 MG tablet TAKE 1 TABLET BY MOUTH EVERY 6 (SIX) HOURS AS NEEDED. (Patient taking differently: Take 600 mg by mouth every 6 (six) hours as needed for moderate pain.)   OVER THE COUNTER MEDICATION Take 3 tablets by mouth daily. Dairy Relief   No current facility-administered medications on file prior to visit.    Review of Systems  Constitutional:  Negative for activity change, appetite change, chills, diaphoresis, fatigue and fever.  HENT:  Negative for congestion and hearing loss.   Eyes:  Negative for visual disturbance.  Respiratory:  Negative for cough, chest tightness, shortness of breath and wheezing.   Cardiovascular:  Negative for chest pain,  palpitations and leg swelling.  Gastrointestinal:  Negative for abdominal pain, constipation, diarrhea, nausea and vomiting.  Genitourinary:  Negative for dysuria, frequency and hematuria.  Musculoskeletal:  Negative for arthralgias and neck pain.  Skin:  Negative for rash.  Neurological:  Negative for dizziness, weakness, light-headedness, numbness and headaches.  Hematological:  Negative for adenopathy.  Psychiatric/Behavioral:  Negative for behavioral problems, dysphoric mood and sleep disturbance.   Per HPI unless specifically indicated above      Objective:    BP 136/88   Pulse 90   Ht _0  (1.676 m)   Wt 240 lb 3.2 oz (109 kg)   LMP 10/04/2019 (Exact Date)   SpO2 96%   BMI 38.77 kg/m   Wt Readings from Last 3 Encounters:  02/01/21 240 lb 3.2 oz (109 kg)  01/17/21 235 lb (106.6 kg)  10/25/20 241 lb 12.8 oz (109.7 kg)    Physical Exam Vitals and nursing note reviewed.  Constitutional:      General: She is not in acute distress.    Appearance: She is well-developed. She is not diaphoretic.     Comments: Well-appearing, comfortable, cooperative  HENT:     Head: Normocephalic and atraumatic.  Eyes:     General:        Right eye: No discharge.        Left eye: No discharge.     Conjunctiva/sclera: Conjunctivae normal.     Pupils: Pupils are equal, round, and reactive to light.  Neck:     Thyroid: No thyromegaly.  Cardiovascular:     Rate and Rhythm: Normal rate and regular rhythm.     Pulses: Normal pulses.     Heart sounds: Normal heart sounds. No murmur heard. Pulmonary:     Effort: Pulmonary effort is normal. No respiratory distress.     Breath sounds: Normal breath sounds. No wheezing or rales.  Abdominal:     General: Bowel sounds are normal. There is no distension.     Palpations: Abdomen is soft. There is no mass.     Tenderness: There is no abdominal tenderness.  Musculoskeletal:        General: No tenderness. Normal range of  motion.     Cervical back:  Normal range of motion and neck supple.     Comments: Upper / Lower Extremities: - Normal muscle tone, strength bilateral upper extremities 5/5, lower extremities 5/5  Lymphadenopathy:     Cervical: No cervical adenopathy.  Skin:    General: Skin is warm and dry.     Findings: No erythema or rash.  Neurological:     Mental Status: She is alert and oriented to person, place, and time.     Comments: Distal sensation intact to light touch all extremities  Psychiatric:        Mood and Affect: Mood normal.        Behavior: Behavior normal.        Thought Content: Thought content normal.     Comments: Well groomed, good eye contact, normal speech and thoughts    Results for orders placed or performed in visit on 01/28/21  Lipid panel  Result Value Ref Range   Cholesterol 191 <200 mg/dL   HDL 50 > OR = 50 mg/dL   Triglycerides 74 <150 mg/dL   LDL Cholesterol (Calc) 124 (H) mg/dL (calc)   Total CHOL/HDL Ratio 3.8 <5.0 (calc)   Non-HDL Cholesterol (Calc) 141 (H) <130 mg/dL (calc)  Comprehensive metabolic panel  Result Value Ref Range   Glucose, Bld 85 65 - 99 mg/dL   BUN 8 7 - 25 mg/dL   Creat 0.88 0.50 - 1.10 mg/dL   BUN/Creatinine Ratio NOT APPLICABLE 6 - 22 (calc)   Sodium 136 135 - 146 mmol/L   Potassium 4.1 3.5 - 5.3 mmol/L   Chloride 102 98 - 110 mmol/L   CO2 26 20 - 32 mmol/L   Calcium 9.6 8.6 - 10.2 mg/dL   Total Protein 6.6 6.1 - 8.1 g/dL   Albumin 4.0 3.6 - 5.1 g/dL   Globulin 2.6 1.9 - 3.7 g/dL (calc)   AG Ratio 1.5 1.0 - 2.5 (calc)   Total Bilirubin 0.6 0.2 - 1.2 mg/dL   Alkaline phosphatase (APISO) 62 31 - 125 U/L   AST 15 10 - 35 U/L   ALT 14 6 - 29 U/L  Hemoglobin A1c  Result Value Ref Range   Hgb A1c MFr Bld 5.8 (H) <5.7 % of total Hgb   Mean Plasma Glucose 120 mg/dL   eAG (mmol/L) 6.6 mmol/L  TSH  Result Value Ref Range   TSH 1.33 mIU/L  CBC with Differential/Platelet  Result Value Ref Range   WBC 3.9 3.8 - 10.8 Thousand/uL   RBC 5.52 (H) 3.80 - 5.10  Million/uL   Hemoglobin 12.5 11.7 - 15.5 g/dL   HCT 40.3 35.0 - 45.0 %   MCV 73.0 (L) 80.0 - 100.0 fL   MCH 22.6 (L) 27.0 - 33.0 pg   MCHC 31.0 (L) 32.0 - 36.0 g/dL   RDW 16.1 (H) 11.0 - 15.0 %   Platelets 261 140 - 400 Thousand/uL   MPV 10.8 7.5 - 12.5 fL   Neutro Abs 1,849 1,500 - 7,800 cells/uL   Lymphs Abs 1,552 850 - 3,900 cells/uL   Absolute Monocytes 410 200 - 950 cells/uL   Eosinophils Absolute 31 15 - 500 cells/uL   Basophils Absolute 59 0 - 200 cells/uL   Neutrophils Relative % 47.4 %   Total Lymphocyte 39.8 %   Monocytes Relative 10.5 %   Eosinophils Relative 0.8 %   Basophils Relative 1.5 %      Assessment & Plan:   Problem List Items  Addressed This Visit     Obesity (BMI 35.0-39.9 without comorbidity)   Iron deficiency anemia secondary to inadequate dietary iron intake   Gastroesophageal reflux disease without esophagitis   Relevant Medications   omeprazole (PRILOSEC) 20 MG capsule   Essential hypertension   Relevant Medications   amLODipine (NORVASC) 10 MG tablet   Elevated hemoglobin A1c   Other Visit Diagnoses     Annual physical exam    -  Primary   Elevated LDL cholesterol level          Updated Health Maintenance information Updated Colonoscopy reviewed 12/2020 Reviewed recent lab results with patient Encouraged improvement to lifestyle with diet and exercise Goal of weight loss  PreDM A1c Elevated A1c 5.8, stable in past 1 year Counseling on lifestyle diet exercise goals No rx medication required at this time.  HTN Controlled on Amlodipine 66m daily  LDL Mild elevated 124 The 10-year ASCVD risk score (Mikey BussingDC Jr., et al., 2013) is: 4% Counseling on lifestyle diet regimen, no rx medication indicated  GERD Re order PPI 215mdaily omeprazole.   Meds ordered this encounter  Medications   amLODipine (NORVASC) 10 MG tablet    Sig: TAKE 1 TABLET BY MOUTH DAILY.    Dispense:  90 tablet    Refill:  3   omeprazole (PRILOSEC) 20 MG  capsule    Sig: Take 1 capsule (20 mg total) by mouth daily before breakfast.    Dispense:  90 capsule    Refill:  3     Follow up plan: Return in about 1 year (around 02/01/2022) for 1 year .  Lab orders to be placed after visit next year.  AlNobie PutnamDODawsonedical Group 02/01/2021, 4:22 PM

## 2021-02-01 NOTE — Patient Instructions (Addendum)
Thank you for coming to the office today.  Keep up the good work overall  Main goal now is to focus on limiting extra carbs starches sugars, A1c at 5.8 in mild pre diabetic range. And cholesterol slightly elevated LDL 124, goal to limit cholesterol in diet but not top priority  Likely back pain is radiated pain from abdomen this is possible, f/u with Dr Allen Norris for now.  Please schedule a Follow-up Appointment to: Return in about 1 year (around 02/01/2022) for 1 year AM apt Annual Physical - fasting labs AFTER.  If you have any other questions or concerns, please feel free to call the office or send a message through [place]. You may also schedule an earlier appointment if necessary.  Additionally, you may be receiving a survey about your experience at our office within a few days to 1 week by e-mail or mail. We value your feedback.  [name], DO [place]

## 2021-02-02 ENCOUNTER — Encounter: Payer: Self-pay | Admitting: Gastroenterology

## 2021-02-02 ENCOUNTER — Ambulatory Visit (INDEPENDENT_AMBULATORY_CARE_PROVIDER_SITE_OTHER): Payer: 59 | Admitting: Gastroenterology

## 2021-02-02 VITALS — Ht 66.0 in | Wt 239.4 lb

## 2021-02-02 DIAGNOSIS — S36899A Unspecified injury of other intra-abdominal organs, initial encounter: Secondary | ICD-10-CM | POA: Diagnosis not present

## 2021-02-02 NOTE — Progress Notes (Signed)
Primary Care Physician: Olin Hauser, DO  Primary Gastroenterologist:  Dr. Lucilla Lame  Chief Complaint  Patient presents with   Follow up CT scan results     HPI: Linda Vega is a 45 y.o. female here for follow-up after having a colonoscopy.  At the time of colonoscopy the patient was found to have an external compression at the cecum suspicious for an appendiceal lesion.  The patient was sent for a CT scan of the abdomen which showed:  IMPRESSION: 1. Abnormal wall thickening in the appendix associated with a rounded masslike protuberance near the appendiceal orifice. This probably represents a small appendiceal mass with some associated chronic appendicitis. Mucocele is not entirely excluded although more commonly mucocele on CT has a distended fluid density appearance rather than the higher density appearance present today. In most circumstances, elective appendectomy would be appropriate. 2. Mildly complex but likely benign left ovarian cystic lesion measuring about 52 cubic cm. A similar appearing lesion measuring about 10 cubic cm was shown on the pelvic ultrasound of 08/14/2019. Consider follow up pelvic sonography to re-evaluate. 3. Chronic left pars defect at L5 with 5 mm grade 1 anterolisthesis of L5 on S1. There is moderate to prominent right foraminal impingement at L5-S1 mild left foraminal impingement at L4-5. 4. 1.0 cm nodule of the lateral limb right adrenal gland, probably a small benign adenoma but technically nonspecific.  The patient's colonoscopy was otherwise unremarkable.  Past Medical History:  Diagnosis Date   Anxiety    BRCA negative 12/2019   MyRisk neg except AXIN2 VUS   Carpal tunnel syndrome right   Family history of breast cancer 12/2019   IBIS=16.7%   GERD (gastroesophageal reflux disease)    Headache    h/o migraines   Hypertension     Current Outpatient Medications  Medication Sig Dispense Refill   acetaminophen  (TYLENOL) 500 MG tablet Take 2 tablets (1,000 mg total) by mouth every 6 (six) hours as needed for mild pain.     amLODipine (NORVASC) 10 MG tablet TAKE 1 TABLET BY MOUTH DAILY. 90 tablet 3   AUVI-Q 0.3 MG/0.3ML SOAJ injection Inject 0.3 mg into the muscle as needed for anaphylaxis.      B Complex-C-Folic Acid (STRESS FORMULA PO) Take 1 tablet by mouth daily. GoodBye Stress by OLLY     Biotin w/ Vitamins C & E (HAIR/SKIN/NAILS PO) Take by mouth.     chlorhexidine (PERIDEX) 0.12 % solution SMARTSIG:By Mouth     Cholecalciferol (VITAMIN D3) 125 MCG (5000 UT) CAPS Take 5,000 Units by mouth every other day.     diclofenac (VOLTAREN) 75 MG EC tablet Take 75 mg by mouth 2 (two) times daily.     ibuprofen (ADVIL) 800 MG tablet TAKE 1 TABLET BY MOUTH EVERY 8 HOURS AS NEEDED FOR MODERATE PAIN. 60 tablet 1   ketoconazole (NIZORAL) 2 % cream Apply 1 application topically 2 (two) times daily as needed (skin irritation/rash.).   1   omeprazole (PRILOSEC) 20 MG capsule Take 1 capsule (20 mg total) by mouth daily before breakfast. 90 capsule 3   OVER THE COUNTER MEDICATION Take 3 tablets by mouth daily. Dairy Relief     valACYclovir (VALTREX) 1000 MG tablet TAKE 1/2 TABLET BY MOUTH 2 TIMES A DAY FOR 3 TO 5 DAYS AS NEEDED FOR FLARE (Patient not taking: Reported on 02/02/2021) 20 tablet 3   No current facility-administered medications for this visit.    Allergies as of 02/02/2021 -  Review Complete 02/02/2021  Allergen Reaction Noted   Shellfish allergy Hives, Hypertension, Itching, Palpitations, Shortness Of Breath, Swelling, and Anaphylaxis 07/13/2011   Eggs or egg-derived products Hives 08/11/2015   Lactose intolerance (gi)  12/01/2020     Physical Examination:   Ht _0  (1.676 m)   Wt 239 lb 6.4 oz (108.6 kg)   LMP 10/04/2019 (Exact Date)   BMI 38.64 kg/m   General: Well-nourished, well-developed in no acute distress.  Eyes: No icterus. Conjunctivae pink. Neuro: Alert and oriented x 3.  Grossly  intact. Skin: Warm and dry, no jaundice.   Psych: Alert and cooperative, normal mood and affect.  Labs:    Imaging Studies: CT Abdomen Pelvis W Contrast  Result Date: 01/29/2021 CLINICAL DATA:  Extrinsic compression of the colon wall at the appendiceal orifice on colonoscopy dated 01/17/2021. EXAM: CT ABDOMEN AND PELVIS WITH CONTRAST TECHNIQUE: Multidetector CT imaging of the abdomen and pelvis was performed using the standard protocol following bolus administration of intravenous contrast. CONTRAST:  128m OMNIPAQUE IOHEXOL 300 MG/ML  SOLN COMPARISON:  None. FINDINGS: Lower chest: Unremarkable Hepatobiliary: Unremarkable Pancreas: Unremarkable Spleen: Unremarkable Adrenals/Urinary Tract: 1.0 by 1.0 cm nodule of the lateral limb right adrenal gland on image 23 of series 2 is technically nonspecific (although statistically likely to be a small benign adenoma). 1.0 by 0.7 cm hypodense lesion in the left mid kidney anterolaterally on 11 series 9 is likely a cyst or similar benign lesion although technically too small to characterize. Stomach/Bowel: The appendix appears abnormal, with a somewhat rounded 1.8 cm nodular component at the appendiceal orifice, and with appendiceal wall thickening and mild dilatation in the rest of the appendix with appendiceal diameter at 1.2 cm, some central lower density along the appendiceal lumen although less than is often encountered in the setting of a mucocele. Appearance suspicious for appendiceal mass or mucocele, superimposed chronic appendicitis cannot be readily excluded given the suggestion of wall thickening. Vascular/Lymphatic: Unremarkable Reproductive: Uterus absent. 5.0 by 4.7 by 4.2 cm (volume = 52 cm^3) homogeneous exophytic lesion of the left ovary has a higher contrast density than the contents of the urinary bladder, compatible with a mildly complex cystic lesion. On 08/14/2019, a left ovarian cystic lesion measured up to 3.2 cm in long axis and had some faint  internal echoes. There is a suggestion of mild wall thickening of the vaginal wall, possibly incidental although an inflammatory process is not excluded. Other: No supplemental non-categorized findings. Musculoskeletal: Chronic left unilateral pars defect at L5 with 5 mm of grade 1 anterolisthesis of L5 on S1, degenerative endplate findings and degenerative disc disease at L5-S1, and moderate to prominent right foraminal stenosis at the L5-S1 level. There is also mild left and borderline right foraminal stenosis at L4-5 due to facet spurring. IMPRESSION: 1. Abnormal wall thickening in the appendix associated with a rounded masslike protuberance near the appendiceal orifice. This probably represents a small appendiceal mass with some associated chronic appendicitis. Mucocele is not entirely excluded although more commonly mucocele on CT has a distended fluid density appearance rather than the higher density appearance present today. In most circumstances, elective appendectomy would be appropriate. 2. Mildly complex but likely benign left ovarian cystic lesion measuring about 52 cubic cm. A similar appearing lesion measuring about 10 cubic cm was shown on the pelvic ultrasound of 08/14/2019. Consider follow up pelvic sonography to re-evaluate. 3. Chronic left pars defect at L5 with 5 mm grade 1 anterolisthesis of L5 on S1. There is moderate to  prominent right foraminal impingement at L5-S1 mild left foraminal impingement at L4-5. 4. 1.0 cm nodule of the lateral limb right adrenal gland, probably a small benign adenoma but technically nonspecific. Electronically Signed   By: Van Clines M.D.   On: 01/29/2021 15:47    Assessment and Plan:   SALLYE LUNZ is a 45 y.o. y/o female who comes in today with a CT scan showing multiple abnormalities.  Of concern is the appendiceal lesion with wall thickening and a rounded masslike protuberance.  There is also a mildly complex but reported to be likely benign  ovarian cyst measuring 52 cm.  It was recommended that a pelvic ultrasound should be done for reevaluation and the patient should follow-up with her GYN doctor for that.  There was also suggestion of a small benign adenoma in the adrenal gland.  Due to the patient's finding at the appendiceal orifice it was recommended that the patient undergo an appendectomy.  The patient will be referred to a surgeon for an appendectomy and has been encouraged to follow-up with her PCP and her GYN for the remaining findings of the CT scan of the abdomen and pelvis.      Lucilla Lame, MD. Marval Regal    Note: This dictation was prepared with Dragon dictation along with smaller phrase technology. Any transcriptional errors that result from this process are unintentional.

## 2021-02-14 ENCOUNTER — Encounter: Payer: Self-pay | Admitting: Surgery

## 2021-02-14 ENCOUNTER — Other Ambulatory Visit: Payer: Self-pay

## 2021-02-14 ENCOUNTER — Ambulatory Visit (INDEPENDENT_AMBULATORY_CARE_PROVIDER_SITE_OTHER): Payer: 59 | Admitting: Surgery

## 2021-02-14 VITALS — BP 150/98 | HR 89 | Temp 98.4°F | Ht 66.0 in | Wt 238.0 lb

## 2021-02-14 DIAGNOSIS — R1031 Right lower quadrant pain: Secondary | ICD-10-CM | POA: Diagnosis not present

## 2021-02-14 DIAGNOSIS — K388 Other specified diseases of appendix: Secondary | ICD-10-CM

## 2021-02-14 DIAGNOSIS — R935 Abnormal findings on diagnostic imaging of other abdominal regions, including retroperitoneum: Secondary | ICD-10-CM

## 2021-02-14 NOTE — Progress Notes (Signed)
Patient ID: Linda Vega, female   DOB: February 25, 1976, 45 y.o.   MRN: [id number]  HPI Linda Vega is a 45 y.o. female seen in consultation at the request of Dr.Wohl for potential appendiceal mass.  She has been having intermittent chronic abdominal pain for the last few months.  Pain is sharp intermittent and moderate intensity. Specific alleviating or aggravating factors.  She has a recent CT scan that have personally reviewed showing evidence of thickening of the appendix and thickening near the appendiceal orifice likely mucocele.  She underwent colonoscopy as well that have personally reviewed showing no obvious cecal masses but there was some extrinsic compression.  He had a recent normal CBC and CMP.  She is able to perform more than 4 METS of activity without any shortness of breath or chest pain.  She has had a history of abdominal hysterectomy and lumpectomy in the past.  HPI  Past Medical History:  Diagnosis Date   Anxiety    BRCA negative 12/2019   MyRisk neg except AXIN2 VUS   Carpal tunnel syndrome right   Family history of breast cancer 12/2019   IBIS=16.7%   GERD (gastroesophageal reflux disease)    Headache    h/o migraines   Hypertension     Past Surgical History:  Procedure Laterality Date   ABDOMINAL HYSTERECTOMY     BREAST BIOPSY Right 11/23/2017   US guided breast biopsy of 2:00 mass, coil marker, cellular fibroadenoma   BREAST BIOPSY Left 07/05/2020   [place] Bx, 3:00, Heart Clip   RF tag [id number].   BREAST BIOPSY Left 07/05/2020   [place] Bx, 5:00, X Clip  RF tag [id number]   BREAST CYST ASPIRATION Left    neg   BREAST EXCISIONAL BIOPSY Left 07/29/2020   BENIGN FIBROADENOMA   BREAST EXCISIONAL BIOPSY Left 07/29/2020   COMPLEX FIBROADENOMA   BREAST LUMPECTOMY WITH RADIOFREQUENCY TAG IDENTIFICATION Left 27/12/2374   Procedure: BREAST LUMPECTOMY WITH RADIOFREQUENCY TAG IDENTIFICATION, lumpectomy x 2;  Surgeon: Olean Ree, MD;  Location: [facility];   Service: General;  Laterality: Left;   CESAREAN SECTION     COLONOSCOPY WITH PROPOFOL N/A 01/17/2021   Procedure: COLONOSCOPY WITH PROPOFOL;  Surgeon: Lucilla Lame, MD;  Location: [place];  Service: Endoscopy;  Laterality: N/A;   CYSTOSCOPY N/A 10/14/2019   Procedure: CYSTOSCOPY;  Surgeon: Homero Fellers, MD;  Location: [facility];  Service: Gynecology;  Laterality: N/A;   fibrocystic breast     IUD REMOVAL N/A 10/14/2019   Procedure: INTRAUTERINE DEVICE (IUD) REMOVAL;  Surgeon: Homero Fellers, MD;  Location: [facility];  Service: Gynecology;  Laterality: N/A;   LAPAROSCOPY FOR ECTOPIC PREGNANCY     TOTAL LAPAROSCOPIC HYSTERECTOMY WITH SALPINGECTOMY Right 10/14/2019   Procedure: TOTAL LAPAROSCOPIC HYSTERECTOMY WITH RIGHT SALPINGECTOMY;  Surgeon: Homero Fellers, MD;  Location: [facility];  Service: Gynecology;  Laterality: Right;    Family History  Problem Relation Age of Onset   Hypertension Mother    Breast cancer Maternal Aunt        38s   Breast cancer Maternal Grandmother 20   Breast cancer Maternal Aunt        50s    Social History Social History   Tobacco Use   Smoking status: Never   Smokeless tobacco: Never  Vaping Use   Vaping Use: Never used  Substance Use Topics   Alcohol use: Yes    Comment: occasional, about 3x a month   Drug use: No  Allergies  Allergen Reactions   Shellfish Allergy Hives, Hypertension, Itching, Palpitations, Shortness Of Breath, Swelling and Anaphylaxis   Eggs Or Egg-Derived Products Hives   Lactose Intolerance (Gi)     Current Outpatient Medications  Medication Sig Dispense Refill   acetaminophen (TYLENOL) 500 MG tablet Take 2 tablets (1,000 mg total) by mouth every 6 (six) hours as needed for mild pain.     amLODipine (NORVASC) 10 MG tablet TAKE 1 TABLET BY MOUTH DAILY. 90 tablet 3   AUVI-Q 0.3 MG/0.3ML SOAJ injection Inject 0.3 mg into the muscle as needed for anaphylaxis.      B Complex-C-Folic Acid (STRESS  FORMULA PO) Take 1 tablet by mouth daily. GoodBye Stress by OLLY     Biotin w/ Vitamins C & E (HAIR/SKIN/NAILS PO) Take by mouth.     chlorhexidine (PERIDEX) 0.12 % solution SMARTSIG:By Mouth     Cholecalciferol (VITAMIN D3) 125 MCG (5000 UT) CAPS Take 5,000 Units by mouth every other day.     ibuprofen (ADVIL) 800 MG tablet TAKE 1 TABLET BY MOUTH EVERY 8 HOURS AS NEEDED FOR MODERATE PAIN. 60 tablet 1   ketoconazole (NIZORAL) 2 % cream Apply 1 application topically 2 (two) times daily as needed (skin irritation/rash.).   1   omeprazole (PRILOSEC) 20 MG capsule Take 1 capsule (20 mg total) by mouth daily before breakfast. 90 capsule 3   No current facility-administered medications for this visit.     Review of Systems Full ROS  was asked and was negative except for the information on the HPI  Physical Exam Blood pressure (!) 150/98, pulse 89, temperature 98.4 F (36.9 C), temperature source Oral, height 5' 6" (1.676 m), weight 238 lb (108 kg), last menstrual period 10/04/2019, SpO2 97 %. CONSTITUTIONAL: NAD EYES: Pupils are equal, round, and reactive to light, Sclera are non-icteric. EARS, NOSE, MOUTH AND THROAT: The oropharynx is clear. The oral mucosa is pink and moist. Hearing is intact to voice. LYMPH NODES:  Lymph nodes in the neck are normal. RESPIRATORY:  Lungs are clear. There is normal respiratory effort, with equal breath sounds bilaterally, and without pathologic use of accessory muscles. CARDIOVASCULAR: Heart is regular without murmurs, gallops, or rubs. GI: The abdomen is soft, nontender, and nondistended. There are no palpable masses. There is no hepatosplenomegaly. There are normal bowel sounds in all quadrants. GU: Rectal deferred.   MUSCULOSKELETAL: Normal muscle strength and tone. No cyanosis or edema.   SKIN: Turgor is good and there are no pathologic skin lesions or ulcers. NEUROLOGIC: Motor and sensation is grossly normal. Cranial nerves are grossly intact. PSYCH:   Oriented to person, place and time. Affect is normal.  Data Reviewed  I have personally reviewed the patient's imaging, laboratory findings and medical records.    Assessment/Plan  45 year old female with chronic intermittent right lower quadrant pain and findings consistent with an appendiceal lesion question about mucocele versus chronic inflammation.Marland Kitchen  Cussed with patient detail about my thought process and first order of business is to establish a more definitive diagnosis.  We will be able to perform this by doing a diagnostic laparoscopy and appendectomy possible seek ectomy.  I will like to wait until final pathology is back before committing to a completion colectomy.  She is in agreement.  Procedure discussed with the patient in detail.  Risks, benefits and possible indications including but not limited to: Bleeding, infection, injury to adjacent structures and the need for possible interventions.  She understands and wished to proceed. A  copy of this report was sent to the referring provider      Diego Pabon, MD FACS General Surgeon 02/14/2021, 4:14 PM    

## 2021-02-14 NOTE — H&P (View-Only) (Signed)
Patient ID: Linda Vega, female   DOB: 04/13/1976, 45 y.o.   MRN: [id number]  HPI Linda Vega is a 45 y.o. female seen in consultation at the request of Dr.Wohl for potential appendiceal mass.  She has been having intermittent chronic abdominal pain for the last few months.  Pain is sharp intermittent and moderate intensity. Specific alleviating or aggravating factors.  She has a recent CT scan that have personally reviewed showing evidence of thickening of the appendix and thickening near the appendiceal orifice likely mucocele.  She underwent colonoscopy as well that have personally reviewed showing no obvious cecal masses but there was some extrinsic compression.  He had a recent normal CBC and CMP.  She is able to perform more than 4 METS of activity without any shortness of breath or chest pain.  She has had a history of abdominal hysterectomy and lumpectomy in the past.  HPI  Past Medical History:  Diagnosis Date   Anxiety    BRCA negative 12/2019   MyRisk neg except AXIN2 VUS   Carpal tunnel syndrome right   Family history of breast cancer 12/2019   IBIS=16.7%   GERD (gastroesophageal reflux disease)    Headache    h/o migraines   Hypertension     Past Surgical History:  Procedure Laterality Date   ABDOMINAL HYSTERECTOMY     BREAST BIOPSY Right 11/23/2017   US guided breast biopsy of 2:00 mass, coil marker, cellular fibroadenoma   BREAST BIOPSY Left 07/05/2020   US Bx, 3:00, Heart Clip   RF tag [id number].   BREAST BIOPSY Left 07/05/2020   US Bx, 5:00, X Clip  RF tag [id number]   BREAST CYST ASPIRATION Left    neg   BREAST EXCISIONAL BIOPSY Left 07/29/2020   BENIGN FIBROADENOMA   BREAST EXCISIONAL BIOPSY Left 07/29/2020   COMPLEX FIBROADENOMA   BREAST LUMPECTOMY WITH RADIOFREQUENCY TAG IDENTIFICATION Left 07/29/2020   Procedure: BREAST LUMPECTOMY WITH RADIOFREQUENCY TAG IDENTIFICATION, lumpectomy x 2;  Surgeon: Piscoya, Jose, MD;  Location: [facility];   Service: General;  Laterality: Left;   CESAREAN SECTION     COLONOSCOPY WITH PROPOFOL N/A 01/17/2021   Procedure: COLONOSCOPY WITH PROPOFOL;  Surgeon: Wohl, Darren, MD;  Location: [facility];  Service: Endoscopy;  Laterality: N/A;   CYSTOSCOPY N/A 10/14/2019   Procedure: CYSTOSCOPY;  Surgeon: Schuman, Christanna R, MD;  Location: [facility];  Service: Gynecology;  Laterality: N/A;   fibrocystic breast     IUD REMOVAL N/A 10/14/2019   Procedure: INTRAUTERINE DEVICE (IUD) REMOVAL;  Surgeon: Schuman, Christanna R, MD;  Location: [facility];  Service: Gynecology;  Laterality: N/A;   LAPAROSCOPY FOR ECTOPIC PREGNANCY     TOTAL LAPAROSCOPIC HYSTERECTOMY WITH SALPINGECTOMY Right 10/14/2019   Procedure: TOTAL LAPAROSCOPIC HYSTERECTOMY WITH RIGHT SALPINGECTOMY;  Surgeon: Schuman, Christanna R, MD;  Location: [facility];  Service: Gynecology;  Laterality: Right;    Family History  Problem Relation Age of Onset   Hypertension Mother    Breast cancer Maternal Aunt        50s   Breast cancer Maternal Grandmother 20   Breast cancer Maternal Aunt        50s    Social History Social History   Tobacco Use   Smoking status: Never   Smokeless tobacco: Never  Vaping Use   Vaping Use: Never used  Substance Use Topics   Alcohol use: Yes    Comment: occasional, about 3x a month   Drug use: No      Allergies  Allergen Reactions   Shellfish Allergy Hives, Hypertension, Itching, Palpitations, Shortness Of Breath, Swelling and Anaphylaxis   Eggs Or Egg-Derived Products Hives   Lactose Intolerance (Gi)     Current Outpatient Medications  Medication Sig Dispense Refill   acetaminophen (TYLENOL) 500 MG tablet Take 2 tablets (1,000 mg total) by mouth every 6 (six) hours as needed for mild pain.     amLODipine (NORVASC) 10 MG tablet TAKE 1 TABLET BY MOUTH DAILY. 90 tablet 3   AUVI-Q 0.3 MG/0.3ML SOAJ injection Inject 0.3 mg into the muscle as needed for anaphylaxis.      B Complex-C-Folic Acid (STRESS  FORMULA PO) Take 1 tablet by mouth daily. GoodBye Stress by OLLY     Biotin w/ Vitamins C & E (HAIR/SKIN/NAILS PO) Take by mouth.     chlorhexidine (PERIDEX) 0.12 % solution SMARTSIG:By Mouth     Cholecalciferol (VITAMIN D3) 125 MCG (5000 UT) CAPS Take 5,000 Units by mouth every other day.     ibuprofen (ADVIL) 800 MG tablet TAKE 1 TABLET BY MOUTH EVERY 8 HOURS AS NEEDED FOR MODERATE PAIN. 60 tablet 1   ketoconazole (NIZORAL) 2 % cream Apply 1 application topically 2 (two) times daily as needed (skin irritation/rash.).   1   omeprazole (PRILOSEC) 20 MG capsule Take 1 capsule (20 mg total) by mouth daily before breakfast. 90 capsule 3   No current facility-administered medications for this visit.     Review of Systems Full ROS  was asked and was negative except for the information on the HPI  Physical Exam Blood pressure (!) 150/98, pulse 89, temperature 98.4 F (36.9 C), temperature source Oral, height 5' 6" (1.676 m), weight 238 lb (108 kg), last menstrual period 10/04/2019, SpO2 97 %. CONSTITUTIONAL: NAD EYES: Pupils are equal, round, and reactive to light, Sclera are non-icteric. EARS, NOSE, MOUTH AND THROAT: The oropharynx is clear. The oral mucosa is pink and moist. Hearing is intact to voice. LYMPH NODES:  Lymph nodes in the neck are normal. RESPIRATORY:  Lungs are clear. There is normal respiratory effort, with equal breath sounds bilaterally, and without pathologic use of accessory muscles. CARDIOVASCULAR: Heart is regular without murmurs, gallops, or rubs. GI: The abdomen is soft, nontender, and nondistended. There are no palpable masses. There is no hepatosplenomegaly. There are normal bowel sounds in all quadrants. GU: Rectal deferred.   MUSCULOSKELETAL: Normal muscle strength and tone. No cyanosis or edema.   SKIN: Turgor is good and there are no pathologic skin lesions or ulcers. NEUROLOGIC: Motor and sensation is grossly normal. Cranial nerves are grossly intact. PSYCH:   Oriented to person, place and time. Affect is normal.  Data Reviewed  I have personally reviewed the patient's imaging, laboratory findings and medical records.    Assessment/Plan  45-year-old female with chronic intermittent right lower quadrant pain and findings consistent with an appendiceal lesion question about mucocele versus chronic inflammation..  Cussed with patient detail about my thought process and first order of business is to establish a more definitive diagnosis.  We will be able to perform this by doing a diagnostic laparoscopy and appendectomy possible seek ectomy.  I will like to wait until final pathology is back before committing to a completion colectomy.  She is in agreement.  Procedure discussed with the patient in detail.  Risks, benefits and possible indications including but not limited to: Bleeding, infection, injury to adjacent structures and the need for possible interventions.  She understands and wished to proceed. A   copy of this report was sent to the referring provider      Lance Galas, MD FACS General Surgeon 02/14/2021, 4:14 PM    

## 2021-02-14 NOTE — Patient Instructions (Signed)
Our surgery scheduler Pamala Hurry will call you within 24-48 hours to get you scheduled. If you have not heard from her after 48 hours, please call our office. You will not need to get Covid tested before surgery and have the blue sheet available when she calls to write down important information.   If you have any concerns or questions, please feel free to call our office.   Laparoscopic Appendectomy, Adult   A laparoscopic appendectomy is a surgery to take out the appendix. The appendix is a finger-like structure that is attached to the large intestine. In this surgery, the appendix is removed through three small incisions with the help of a thin, lighted tube that has a camera (laparoscope). This procedure may be done to prevent an inflamed appendix from bursting (rupturing). It may also be done to treat the infection from an appendix that has already ruptured. It is usually done right after inflammation of the appendix (appendicitis) is diagnosed. This is a minimally invasive surgery. It usually results in less pain, fewer problems, and a quicker recovery than surgery done through alarge incision. Tell a health care provider about: Any allergies you have. All medicines you are taking, including vitamins, herbs, eye drops, creams, and over-the-counter medicines. Use of steroids (by mouth or creams). Any problems you or family members have had with anesthetic medicines. Any blood disorders you have. Any surgeries you have had. Any medical conditions you have. Whether you are pregnant or may be pregnant. What are the risks? Generally, this is a safe procedure. However, problems may occur, including: Infection. Bleeding. Allergic reactions to medicines. Damage to other structures or organs. The formation of pus (abscesses). Long-lasting pain or scarring at the incision sites or inside the abdomen. Blood clots in the legs. What happens before the procedure? Eating and drinking  restrictions Follow instructions from your health care provider about eating and drinking restrictions. You may be asked not to eat or drink as soon as the diagnosis ofappendicitis is made. Medicines Ask your health care provider about: Changing or stopping your regular medicines. This is especially important if you are taking diabetes medicines or blood thinners. Taking medicines such as aspirin and ibuprofen. These medicines can thin your blood. Do not take these medicines unless your health care provider tells you to take them. Taking over-the-counter medicines, vitamins, herbs, and supplements. General instructions Plan to have someone take you home from the hospital. If you will be going home right after the procedure, plan to have someone with you for 24 hours. You may be given antibiotic medicine to help prevent infection or to treat existing inflammation or infection. Ask your health care provider how your surgical site will be marked or identified. Ask your health care provider what steps will be taken to help prevent infection. These may include: Removing hair at the surgery site. Washing skin with a germ-killing soap. Taking antibiotic medicine. What happens during the procedure?  An IV will be inserted into one of your veins. You will be given one or more of the following: A medicine to help you relax (sedative). A medicine to numb the area (local anesthetic). A medicine to make you fall asleep (general anesthetic). A thin, flexible tube (catheter) may be put into your bladder to drain urine. A tube may be passed through your nose and into your stomach (NG tube, or nasogastric tube) to drain any stomach contents. Your surgeon will make three small incisions near your belly button (navel). Air-like gas will be used  to fill your abdomen. The gas will make your abdomen expand. This helps the surgeon see clearly and gives him or her more room to work. A laparoscope will be passed  through one of the incisions. Other long, thin surgical instruments will be passed through the other incisions. The appendix will be located and removed through one of the incisions. The abdomen may be washed out to remove bacteria. The incisions will be closed with stitches (sutures), staples, or adhesive strips. A bandage (dressing) may be used to cover the incisions. If a tube was inserted into your bladder or stomach, it will be removed. The procedure may vary among health care providers and hospitals. What happens after the procedure? Your blood pressure, heart rate, breathing rate, and blood oxygen level will be monitored until you leave the hospital. You will be given medicines as needed to control pain. Do not drive for 24 hours if you were given a sedative during your procedure. If your appendix did not rupture, you may be able to go home the same day after your surgery. If your appendix ruptured: You will get antibiotic medicine through an IV line. You may be sent home with a temporary drain. Summary A laparoscopic appendectomy is a surgery to take out the appendix. The appendix is removed through three small incisions with the help of a thin, lighted tube that has a camera. This is a safe procedure, but there are some risks, including bleeding, infection, allergic reaction to medicines, or damage to other organs. You may be asked not to eat or drink as soon as a diagnosis of appendicitis is made. After the procedure, your blood pressure, heart rate, breathing rate, and blood oxygen level will be monitored until you leave the hospital. This information is not intended to replace advice given to you by your health care provider. Make sure you discuss any questions you have with your healthcare provider. Document Revised: 01/17/2018 Document Reviewed: 01/17/2018 Elsevier Patient Education  2022 [employer].

## 2021-02-15 ENCOUNTER — Telehealth: Payer: Self-pay | Admitting: Surgery

## 2021-02-15 NOTE — Telephone Encounter (Signed)
Patient has been advised of Pre-Admission date/time, COVID Testing date and Surgery date.  Surgery Date: 02/17/21 Preadmission Testing Date: 02/16/21 (phone 8a-1p) Covid Testing Date: Not needed.     Patient has been made aware to call [phone], between 1-3:00pm the day before surgery, to find out what time to arrive for surgery.

## 2021-02-16 ENCOUNTER — Other Ambulatory Visit: Payer: Self-pay

## 2021-02-16 ENCOUNTER — Other Ambulatory Visit
Admission: RE | Admit: 2021-02-16 | Discharge: 2021-02-16 | Disposition: A | Discharge status: Home / Self Care | Payer: 59 | Source: Ambulatory Visit | Attending: Obstetrics and Gynecology | Admitting: Obstetrics and Gynecology

## 2021-02-16 HISTORY — DX: Unspecified osteoarthritis, unspecified site: M19.90

## 2021-02-16 MED ORDER — ORAL CARE MOUTH RINSE: 15.0000 mL | Freq: Once | OROMUCOSAL | Status: AC

## 2021-02-16 MED ORDER — ACETAMINOPHEN 500 MG PO TABS: 1000.0000 mg | ORAL_TABLET | ORAL | Status: AC

## 2021-02-16 MED ORDER — LACTATED RINGERS IV SOLN: INTRAVENOUS | Status: DC

## 2021-02-16 MED ORDER — GABAPENTIN 300 MG PO CAPS: 300.0000 mg | ORAL_CAPSULE | ORAL | Status: AC

## 2021-02-16 MED ORDER — CHLORHEXIDINE GLUCONATE 0.12 % MT SOLN: 15.0000 mL | Freq: Once | OROMUCOSAL | Status: AC

## 2021-02-16 MED ORDER — CELECOXIB 200 MG PO CAPS: 200.0000 mg | ORAL_CAPSULE | ORAL | Status: AC

## 2021-02-16 MED ORDER — CHLORHEXIDINE GLUCONATE CLOTH 2 % EX PADS
6.0000 | MEDICATED_PAD | Freq: Once | CUTANEOUS | Status: AC
  Administered 2021-02-17: 6 via TOPICAL

## 2021-02-16 MED ORDER — SODIUM CHLORIDE 0.9 % IV SOLN
2.0000 g | INTRAVENOUS | Status: AC
  Administered 2021-02-17: 2 g via INTRAVENOUS

## 2021-02-16 NOTE — Patient Instructions (Addendum)
Your procedure is scheduled on: 02/17/21 Report to the Registration Desk on the 1st floor of the [facility]. To find out your arrival time, please call [phone] between 1PM - 3PM on: 02/16/21  REMEMBER: Instructions that are not followed completely may result in serious medical risk, up to and including death; or upon the discretion of your surgeon and anesthesiologist your surgery may need to be rescheduled.  Do not eat food after midnight the night before surgery.  No gum chewing, lozengers or hard candies.  You may however, drink CLEAR liquids up to 2 hours before you are scheduled to arrive for your surgery. Do not drink anything within 2 hours of your scheduled arrival time.  Clear liquids include: - water  - apple juice without pulp - gatorade (not RED, PURPLE, OR BLUE) - black coffee or tea (Do NOT add milk or creamers to the coffee or tea) Do NOT drink anything that is not on this list.  TAKE THESE MEDICATIONS THE MORNING OF SURGERY WITH A SIP OF WATER:  - amLODipine (NORVASC) 10 MG tablet - omeprazole (PRILOSEC) 20 MG capsule  One week prior to surgery: Stop Anti-inflammatories (NSAIDS) such as Advil, Aleve, Ibuprofen, Motrin, Naproxen, Naprosyn and Aspirin based products such as Excedrin, Goodys Powder, BC Powder.  Stop ANY OVER THE COUNTER supplements until after surgery.  You may however, continue to take Tylenol if needed for pain up until the day of surgery.  No Alcohol for 24 hours before or after surgery.  No Smoking including e-cigarettes for 24 hours prior to surgery.  No chewable tobacco products for at least 6 hours prior to surgery.  No nicotine patches on the day of surgery.  Do not use any "recreational" drugs for at least a week prior to your surgery.  Please be advised that the combination of cocaine and anesthesia may have negative outcomes, up to and including death. If you test positive for cocaine, your surgery will be cancelled.  On the  morning of surgery brush your teeth with toothpaste and water, you may rinse your mouth with mouthwash if you wish. Do not swallow any toothpaste or mouthwash.  Do not wear jewelry, make-up, hairpins, clips or nail polish.  Do not wear lotions, powders, or perfumes.   Do not shave body from the neck down 48 hours prior to surgery just in case you cut yourself which could leave a site for infection.  Also, freshly shaved skin may become irritated if using the CHG soap.  Contact lenses, hearing aids and dentures may not be worn into surgery.  Do not bring valuables to the hospital. [facility] is not responsible for any missing/lost belongings or valuables.   Notify your doctor if there is any change in your medical condition (cold, fever, infection).  Wear comfortable clothing (specific to your surgery type) to the hospital.  After surgery, you can help prevent lung complications by doing breathing exercises.  Take deep breaths and cough every 1-2 hours. Your doctor may order a device called an Incentive Spirometer to help you take deep breaths. When coughing or sneezing, hold a pillow firmly against your incision with both hands. This is called "splinting." Doing this helps protect your incision. It also decreases belly discomfort.  If you are being admitted to the hospital overnight, leave your suitcase in the car. After surgery it may be brought to your room.  If you are being discharged the day of surgery, you will not be allowed to drive home.  You will need a responsible adult (18 years or older) to drive you home and stay with you that night.   If you are taking public transportation, you will need to have a responsible adult (18 years or older) with you. Please confirm with your physician that it is acceptable to use public transportation.   Please call the [facility] Dept. at [phone] if you have any questions about these instructions.  Surgery Visitation  Policy:  Patients undergoing a surgery or procedure may have one family member or support person with them as long as that person is not COVID-19 positive or experiencing its symptoms.  That person may remain in the waiting area during the procedure.  Inpatient Visitation:    Visiting hours are 7 a.m. to 8 p.m. Inpatients will be allowed two visitors daily. The visitors may change each day during the patient's stay. No visitors under the age of 8. Any visitor under the age of 44 must be accompanied by an adult. The visitor must pass COVID-19 screenings, use hand sanitizer when entering and exiting the patient's room and wear a mask at all times, including in the patient's room. Patients must also wear a mask when staff or their visitor are in the room. Masking is required regardless of vaccination status.

## 2021-02-17 ENCOUNTER — Encounter
Admission: RE | Disposition: A | Discharge status: Home / Self Care | Payer: Self-pay | Source: Home / Self Care | Attending: Surgery

## 2021-02-17 ENCOUNTER — Ambulatory Visit: Payer: 59 | Admitting: Certified Registered Nurse Anesthetist

## 2021-02-17 ENCOUNTER — Other Ambulatory Visit: Payer: Self-pay

## 2021-02-17 ENCOUNTER — Encounter: Payer: Self-pay | Admitting: Surgery

## 2021-02-17 ENCOUNTER — Ambulatory Visit
Admission: RE | Admit: 2021-02-17 | Discharge: 2021-02-17 | Disposition: A | Discharge status: Home / Self Care | Payer: 59 | Attending: Surgery | Admitting: Surgery

## 2021-02-17 DIAGNOSIS — K389 Disease of appendix, unspecified: Secondary | ICD-10-CM | POA: Diagnosis not present

## 2021-02-17 DIAGNOSIS — K219 Gastro-esophageal reflux disease without esophagitis: Secondary | ICD-10-CM | POA: Diagnosis not present

## 2021-02-17 DIAGNOSIS — Z90710 Acquired absence of both cervix and uterus: Secondary | ICD-10-CM | POA: Diagnosis not present

## 2021-02-17 DIAGNOSIS — Z79899 Other long term (current) drug therapy: Secondary | ICD-10-CM | POA: Diagnosis not present

## 2021-02-17 DIAGNOSIS — K388 Other specified diseases of appendix: Secondary | ICD-10-CM | POA: Diagnosis not present

## 2021-02-17 DIAGNOSIS — Z8759 Personal history of other complications of pregnancy, childbirth and the puerperium: Secondary | ICD-10-CM | POA: Insufficient documentation

## 2021-02-17 DIAGNOSIS — I10 Essential (primary) hypertension: Secondary | ICD-10-CM | POA: Diagnosis not present

## 2021-02-17 DIAGNOSIS — Z8249 Family history of ischemic heart disease and other diseases of the circulatory system: Secondary | ICD-10-CM | POA: Insufficient documentation

## 2021-02-17 DIAGNOSIS — Z9079 Acquired absence of other genital organ(s): Secondary | ICD-10-CM | POA: Diagnosis not present

## 2021-02-17 HISTORY — PX: LAPAROSCOPIC APPENDECTOMY: SHX408

## 2021-02-17 SURGERY — APPENDECTOMY, LAPAROSCOPIC
Anesthesia: General

## 2021-02-17 MED ORDER — 0.9 % SODIUM CHLORIDE (POUR BTL) OPTIME
TOPICAL | Status: DC | PRN
  Administered 2021-02-17: 50 mL

## 2021-02-17 MED ORDER — BUPIVACAINE LIPOSOME 1.3 % IJ SUSP
INTRAMUSCULAR | Status: DC | PRN
  Administered 2021-02-17: 20 mL

## 2021-02-17 MED ORDER — SUGAMMADEX SODIUM 200 MG/2ML IV SOLN
INTRAVENOUS | Status: DC | PRN
  Administered 2021-02-17: 216 mg via INTRAVENOUS

## 2021-02-17 MED ORDER — SODIUM CHLORIDE 0.9 % IV SOLN
INTRAVENOUS | Status: AC
  Filled 2021-02-17: qty 2

## 2021-02-17 MED ORDER — PROMETHAZINE HCL 25 MG/ML IJ SOLN: 6.2500 mg | INTRAMUSCULAR | Status: DC | PRN

## 2021-02-17 MED ORDER — CHLORHEXIDINE GLUCONATE 0.12 % MT SOLN
OROMUCOSAL | Status: AC
  Administered 2021-02-17: 15 mL via OROMUCOSAL
  Filled 2021-02-17: qty 15

## 2021-02-17 MED ORDER — OXYCODONE HCL 5 MG PO TABS
5.0000 mg | ORAL_TABLET | Freq: Once | ORAL | Status: AC | PRN
  Administered 2021-02-17: 5 mg via ORAL

## 2021-02-17 MED ORDER — GABAPENTIN 300 MG PO CAPS
ORAL_CAPSULE | ORAL | Status: AC
  Administered 2021-02-17: 300 mg via ORAL
  Filled 2021-02-17: qty 1

## 2021-02-17 MED ORDER — BUPIVACAINE-EPINEPHRINE 0.25% -1:200000 IJ SOLN
INTRAMUSCULAR | Status: DC | PRN
  Administered 2021-02-17: 30 mL

## 2021-02-17 MED ORDER — ACETAMINOPHEN 500 MG PO TABS
ORAL_TABLET | ORAL | Status: AC
  Administered 2021-02-17: 1000 mg via ORAL
  Filled 2021-02-17: qty 2

## 2021-02-17 MED ORDER — HYDROMORPHONE HCL 1 MG/ML IJ SOLN
INTRAMUSCULAR | Status: AC
  Filled 2021-02-17: qty 1

## 2021-02-17 MED ORDER — PROPOFOL 10 MG/ML IV BOLUS
INTRAVENOUS | Status: AC
  Filled 2021-02-17: qty 20

## 2021-02-17 MED ORDER — ONDANSETRON HCL 4 MG/2ML IJ SOLN
INTRAMUSCULAR | Status: DC | PRN
  Administered 2021-02-17: 4 mg via INTRAVENOUS

## 2021-02-17 MED ORDER — OXYCODONE HCL 5 MG PO TABS
ORAL_TABLET | ORAL | Status: AC
  Filled 2021-02-17: qty 1

## 2021-02-17 MED ORDER — HYDROCODONE-ACETAMINOPHEN 5-325 MG PO TABS: 1.0000 | ORAL_TABLET | ORAL | 0 refills | Status: DC | PRN

## 2021-02-17 MED ORDER — DEXAMETHASONE SODIUM PHOSPHATE 10 MG/ML IJ SOLN
INTRAMUSCULAR | Status: DC | PRN
  Administered 2021-02-17: 10 mg via INTRAVENOUS

## 2021-02-17 MED ORDER — SUCCINYLCHOLINE CHLORIDE 20 MG/ML IJ SOLN
INTRAMUSCULAR | Status: DC | PRN
  Administered 2021-02-17: 100 mg via INTRAVENOUS

## 2021-02-17 MED ORDER — CELECOXIB 200 MG PO CAPS
ORAL_CAPSULE | ORAL | Status: AC
  Administered 2021-02-17: 200 mg via ORAL
  Filled 2021-02-17: qty 1

## 2021-02-17 MED ORDER — LACTATED RINGERS IV SOLN: INTRAVENOUS | Status: DC | PRN

## 2021-02-17 MED ORDER — OXYCODONE HCL 5 MG/5ML PO SOLN
5.0000 mg | Freq: Once | ORAL | Status: AC | PRN
Start: 2021-02-17 — End: 2021-02-17

## 2021-02-17 MED ORDER — BUPIVACAINE LIPOSOME 1.3 % IJ SUSP
INTRAMUSCULAR | Status: AC
  Filled 2021-02-17: qty 20

## 2021-02-17 MED ORDER — FENTANYL CITRATE (PF) 100 MCG/2ML IJ SOLN
INTRAMUSCULAR | Status: DC | PRN
  Administered 2021-02-17: 100 ug via INTRAVENOUS

## 2021-02-17 MED ORDER — FENTANYL CITRATE (PF) 100 MCG/2ML IJ SOLN
INTRAMUSCULAR | Status: AC
  Filled 2021-02-17: qty 2

## 2021-02-17 MED ORDER — ROCURONIUM BROMIDE 100 MG/10ML IV SOLN: INTRAVENOUS | Status: DC | PRN

## 2021-02-17 MED ORDER — MIDAZOLAM HCL 2 MG/2ML IJ SOLN
INTRAMUSCULAR | Status: AC
  Filled 2021-02-17: qty 2

## 2021-02-17 MED ORDER — LIDOCAINE HCL (CARDIAC) PF 100 MG/5ML IV SOSY
PREFILLED_SYRINGE | INTRAVENOUS | Status: DC | PRN
  Administered 2021-02-17: 100 mg via INTRAVENOUS

## 2021-02-17 MED ORDER — MIDAZOLAM HCL 2 MG/2ML IJ SOLN
INTRAMUSCULAR | Status: DC | PRN
  Administered 2021-02-17: 2 mg via INTRAVENOUS

## 2021-02-17 MED ORDER — BUPIVACAINE-EPINEPHRINE (PF) 0.25% -1:200000 IJ SOLN
INTRAMUSCULAR | Status: AC
  Filled 2021-02-17: qty 30

## 2021-02-17 MED ORDER — PHENYLEPHRINE HCL (PRESSORS) 10 MG/ML IV SOLN
INTRAVENOUS | Status: DC | PRN
  Administered 2021-02-17 (×3): 100 ug via INTRAVENOUS

## 2021-02-17 MED ORDER — PROPOFOL 10 MG/ML IV BOLUS
INTRAVENOUS | Status: DC | PRN
  Administered 2021-02-17: 160 mg via INTRAVENOUS

## 2021-02-17 MED ORDER — FENTANYL CITRATE (PF) 100 MCG/2ML IJ SOLN
INTRAMUSCULAR | Status: AC
  Administered 2021-02-17: 50 ug via INTRAVENOUS
  Filled 2021-02-17: qty 2

## 2021-02-17 MED ORDER — ROCURONIUM BROMIDE 100 MG/10ML IV SOLN
INTRAVENOUS | Status: DC | PRN
  Administered 2021-02-17: 40 mg via INTRAVENOUS

## 2021-02-17 MED ORDER — FENTANYL CITRATE (PF) 100 MCG/2ML IJ SOLN
25.0000 ug | INTRAMUSCULAR | Status: DC | PRN
  Administered 2021-02-17 (×2): 25 ug via INTRAVENOUS

## 2021-02-17 SURGICAL SUPPLY — 46 items
ADH SKN CLS APL DERMABOND .7 (GAUZE/BANDAGES/DRESSINGS) ×1
APL PRP STRL LF DISP 70% ISPRP (MISCELLANEOUS) ×1
APL SWBSTK 6 STRL LF DISP (MISCELLANEOUS) ×1
APPLICATOR COTTON TIP 6 STRL (MISCELLANEOUS) ×1 IMPLANT
APPLICATOR COTTON TIP 6IN STRL (MISCELLANEOUS) ×2
APPLIER CLIP 5 13 M/L LIGAMAX5 (MISCELLANEOUS) ×2
APR CLP MED LRG 5 ANG JAW (MISCELLANEOUS) ×1
BAG SPEC RTRVL LRG 6X4 10 (ENDOMECHANICALS) ×1
BLADE CLIPPER SURG (BLADE) ×2 IMPLANT
CANISTER SUCT 1200ML W/VALVE (MISCELLANEOUS) ×2 IMPLANT
CHLORAPREP W/TINT 26 (MISCELLANEOUS) ×2 IMPLANT
CLIP APPLIE 5 13 M/L LIGAMAX5 (MISCELLANEOUS) ×1 IMPLANT
CUTTER FLEX LINEAR 45M (STAPLE) ×2 IMPLANT
DEFOGGER SCOPE WARMER CLEARIFY (MISCELLANEOUS) ×2 IMPLANT
DERMABOND ADVANCED (GAUZE/BANDAGES/DRESSINGS) ×1
DERMABOND ADVANCED .7 DNX12 (GAUZE/BANDAGES/DRESSINGS) ×1 IMPLANT
ELECT CAUTERY BLADE 6.4 (BLADE) ×2 IMPLANT
ELECT REM PT RETURN 9FT ADLT (ELECTROSURGICAL) ×2
ELECTRODE REM PT RTRN 9FT ADLT (ELECTROSURGICAL) ×1 IMPLANT
GAUZE 4X4 16PLY ~~LOC~~+RFID DBL (SPONGE) ×2 IMPLANT
GLOVE SURG ENC MOIS LTX SZ7 (GLOVE) ×2 IMPLANT
GOWN STRL REUS W/ TWL LRG LVL3 (GOWN DISPOSABLE) ×2 IMPLANT
GOWN STRL REUS W/TWL LRG LVL3 (GOWN DISPOSABLE) ×4
IRRIGATION STRYKERFLOW (MISCELLANEOUS) ×1 IMPLANT
IRRIGATOR STRYKERFLOW (MISCELLANEOUS) ×2
IV NS 1000ML (IV SOLUTION) ×2
IV NS 1000ML BAXH (IV SOLUTION) ×1 IMPLANT
MANIFOLD NEPTUNE II (INSTRUMENTS) ×2 IMPLANT
NEEDLE HYPO 22GX1.5 SAFETY (NEEDLE) ×2 IMPLANT
NS IRRIG 500ML POUR BTL (IV SOLUTION) ×2 IMPLANT
PACK LAP CHOLECYSTECTOMY (MISCELLANEOUS) ×2 IMPLANT
PENCIL ELECTRO HAND CTR (MISCELLANEOUS) ×2 IMPLANT
POUCH SPECIMEN RETRIEVAL 10MM (ENDOMECHANICALS) ×2 IMPLANT
RELOAD 45 VASCULAR/THIN (ENDOMECHANICALS) ×4 IMPLANT
RELOAD STAPLE TA45 3.5 REG BLU (ENDOMECHANICALS) ×2 IMPLANT
SCISSORS METZENBAUM CVD 33 (INSTRUMENTS) IMPLANT
SHEARS HARMONIC ACE PLUS 36CM (ENDOMECHANICALS) ×2 IMPLANT
SLEEVE ENDOPATH XCEL 5M (ENDOMECHANICALS) ×2 IMPLANT
SPONGE T-LAP 18X18 ~~LOC~~+RFID (SPONGE) ×2 IMPLANT
SUT MNCRL AB 4-0 PS2 18 (SUTURE) ×2 IMPLANT
SUT VICRYL 0 AB UR-6 (SUTURE) ×4 IMPLANT
SYR 20ML LL LF (SYRINGE) ×2 IMPLANT
TRAY FOLEY MTR SLVR 16FR STAT (SET/KITS/TRAYS/PACK) ×2 IMPLANT
TROCAR XCEL BLUNT TIP 100MML (ENDOMECHANICALS) ×2 IMPLANT
TROCAR XCEL NON-BLD 5MMX100MML (ENDOMECHANICALS) ×4 IMPLANT
TUBING EVAC SMOKE HEATED PNEUM (TUBING) ×2 IMPLANT

## 2021-02-17 NOTE — OR Nursing (Addendum)
Yipee!  He peed!  Not sure where 1st note went, he as having issues with void, but no issues now :)))  OOPS - WRONG PT, SORRY :)))

## 2021-02-17 NOTE — Discharge Instructions (Signed)
AMBULATORY SURGERY  DISCHARGE INSTRUCTIONS   The drugs that you were given will stay in your system until tomorrow so for the next 24 hours you should not:  Drive an automobile Make any legal decisions Drink any alcoholic beverage   You may resume regular meals tomorrow.  Today it is better to start with liquids and gradually work up to solid foods.  You may eat anything you prefer, but it is better to start with liquids, then soup and crackers, and gradually work up to solid foods.   Please notify your doctor immediately if you have any unusual bleeding, trouble breathing, redness and pain at the surgery site, drainage, fever, or pain not relieved by medication.    Additional Instructions:        Please contact your physician with any problems or Same Day Surgery at [phone], Monday through Friday 6 am to 4 pm, or [place] at [facility] number at [phone].

## 2021-02-17 NOTE — Transfer of Care (Signed)
Immediate Anesthesia Transfer of Care Note  Patient: Linda Vega  Procedure(s) Performed: APPENDECTOMY LAPAROSCOPIC PARTIAL CECECTOMY  Patient Location: PACU  Anesthesia Type:General  Level of Consciousness: awake, alert  and oriented  Airway & Oxygen Therapy: Patient Spontanous Breathing and Patient connected to face mask oxygen  Post-op Assessment: Report given to RN and Post -op Vital signs reviewed and stable  Post vital signs: Reviewed and stable  Last Vitals:  Vitals Value Taken Time  BP 137/87 02/17/21 1135  Temp    Pulse 75 02/17/21 1137  Resp 12 02/17/21 1137  SpO2 100 % 02/17/21 1137  Vitals shown include unvalidated device data.  Last Pain:  Vitals:   02/17/21 0921  TempSrc: Temporal  PainSc: 0-No pain         Complications: No notable events documented.

## 2021-02-17 NOTE — Anesthesia Procedure Notes (Signed)
Procedure Name: Intubation Date/Time: 02/17/2021 10:23 AM Performed by: Willette Alma, CRNA Pre-anesthesia Checklist: Patient identified, Patient being monitored, Timeout performed, Emergency Drugs available and Suction available Patient Re-evaluated:Patient Re-evaluated prior to induction Oxygen Delivery Method: Circle system utilized Preoxygenation: Pre-oxygenation with 100% oxygen Induction Type: IV induction Ventilation: Mask ventilation without difficulty Laryngoscope Size: 3 and McGraph Grade View: Grade I Tube type: Oral Tube size: 7.0 mm Number of attempts: 1 Airway Equipment and Method: Stylet Placement Confirmation: ETT inserted through vocal cords under direct vision, positive ETCO2 and breath sounds checked- equal and bilateral Secured at: 21 cm Tube secured with: Tape Dental Injury: Teeth and Oropharynx as per pre-operative assessment

## 2021-02-17 NOTE — Anesthesia Postprocedure Evaluation (Signed)
Anesthesia Post Note  Patient: Linda Vega  Procedure(s) Performed: APPENDECTOMY LAPAROSCOPIC PARTIAL CECECTOMY  Patient location during evaluation: PACU Anesthesia Type: General Level of consciousness: awake and alert Pain management: pain level controlled Vital Signs Assessment: post-procedure vital signs reviewed and stable Respiratory status: spontaneous breathing, nonlabored ventilation and respiratory function stable Cardiovascular status: blood pressure returned to baseline and stable Postop Assessment: no apparent nausea or vomiting Anesthetic complications: no   No notable events documented.   Last Vitals:  Vitals:   02/17/21 1259 02/17/21 1356  BP: 131/82 128/82  Pulse: 78 87  Resp: 14 16  Temp: 36.7 C   SpO2: 100% 98%    Last Pain:  Vitals:   02/17/21 1356  TempSrc:   PainSc: 2                  Iran Ouch

## 2021-02-17 NOTE — Op Note (Signed)
laparascopic Partial Cecectomy  [name] Date of operation:  02/17/2021  Indications: The patient presented with a history of  abdominal pain And appendiceal mass.  Pre-operative Diagnosis: Appendiceal mass  Post-operative Diagnosis: Same  Surgeon: Caroleen Hamman, MD, FACS  Anesthesia: General with endotracheal tube  Findings: Dilated appendix, no evidence of metastatic disease. Mobile and not attached to deep structures. Ovaries w/o masses. No evidence of carcinomatosis or peritoneal implants   Estimated Blood Loss: 5cc         Specimens: appendix cecum         Complications:  none  Procedure Details  The patient was seen again in the preop area. The options of surgery versus observation were reviewed with the patient and/or family. The risks of bleeding, infection, recurrence of symptoms, negative laparoscopy, potential for an open procedure, bowel injury, abscess or infection, were all reviewed as well. The patient was taken to Operating Room, identified as Linda Vega and the procedure verified as laparoscopic appendectomy. A Time Out was held and the above information confirmed.  The patient was placed in the supine position and general anesthesia was induced.  Antibiotic prophylaxis was administered and VT E prophylaxis was in place. The abdomen was prepped and draped in a sterile fashion. A supraumbilical incision was made. A cutdown technique was used to enter the abdominal cavity. Two vicryl stitches were placed on the fascia and a Hasson trocar inserted. Pneumoperitoneum obtained. Two 5 mm ports were placed under direct visualization.   The appendix was identified and found to be dilated but not inflamed. NO obvious evidence of masses.   The appendix was carefully dissected. The mesoappendix was divided with Harmonic scalpel. I mobilized the Right colon in a lateral to medial fashion The base of the appendix was dissected out I performed a good circumferential  dissection around the cecum.  with a standard load Endo GIA a partial cecectomy was performed incorporating 1 cm of cecal margin circumferentially.The specimen was placed in a Endo Catch bag and removed via the Hasson port. The right lower quadrant and pelvis was then irrigated with  normal saline which was aspirated. Inspection  failed to identify any additional bleeding and there were no signs of bowel injury. Again the right lower quadrant was inspected there was no sign of bleeding or bowel injury therefore pneumoperitoneum was released, all ports were removed. Liposomal marcaine was infiltrated on all incisions.   The umbilical fascia was closed with 0 Vicryl interrupted sutures and the skin incisions were approximated with subcuticular 4-0 Monocryl. Dermabond was placed The patient tolerated the procedure well, there were no complications. The sponge lap and needle count were correct at the end of the procedure.  The patient was taken to the recovery room in stable condition to be admitted for continued care.    Caroleen Hamman, MD FACS

## 2021-02-17 NOTE — Interval H&P Note (Signed)
History and Physical Interval Note:  02/17/2021 9:32 AM  Linda Vega  has presented today for surgery, with the diagnosis of appendicidal mass.  The various methods of treatment have been discussed with the patient and family. After consideration of risks, benefits and other options for treatment, the patient has consented to  Procedure(s): APPENDECTOMY LAPAROSCOPIC (N/A) as a surgical intervention.  The patient's history has been reviewed, patient examined, no change in status, stable for surgery.  I have reviewed the patient's chart and labs.  Questions were answered to the patient's satisfaction.     [place]avenel

## 2021-02-17 NOTE — Anesthesia Preprocedure Evaluation (Signed)
Anesthesia Evaluation  Patient identified by MRN, date of birth, ID band Patient awake    Reviewed: Allergy & Precautions, NPO status , Patient's Chart, lab work & pertinent test results  Airway Mallampati: III  TM Distance: >3 FB Neck ROM: Full    Dental no notable dental hx.    Pulmonary neg pulmonary ROS,    Pulmonary exam normal        Cardiovascular hypertension, Pt. on medications Normal cardiovascular exam     Neuro/Psych  Headaches, Anxiety    GI/Hepatic Neg liver ROS, GERD  Controlled,  Endo/Other  negative endocrine ROS  Renal/GU negative Renal ROS     Musculoskeletal  (+) Arthritis , Osteoarthritis,    Abdominal (+) + obese,   Peds  Hematology negative hematology ROS (+)   Anesthesia Other Findings   Reproductive/Obstetrics negative OB ROS                             Anesthesia Physical  Anesthesia Plan  ASA: 2  Anesthesia Plan: General   Post-op Pain Management:    Induction: Intravenous  PONV Risk Score and Plan: 3 and Treatment may vary due to age or medical condition, Ondansetron and Dexamethasone  Airway Management Planned: Oral ETT  Additional Equipment:   Intra-op Plan:   Post-operative Plan: Extubation in OR  Informed Consent: I have reviewed the patients History and Physical, chart, labs and discussed the procedure including the risks, benefits and alternatives for the proposed anesthesia with the patient or authorized representative who has indicated his/her understanding and acceptance.     Dental advisory given  Plan Discussed with: CRNA, Anesthesiologist and Surgeon  Anesthesia Plan Comments: (Patient consented for risks of anesthesia including but not limited to:  - adverse reactions to medications - damage to eyes, teeth, lips or other oral mucosa - nerve damage due to positioning  - sore throat or hoarseness - Damage to heart, brain,  nerves, lungs, other parts of body or loss of life  Patient voiced understanding.)        Anesthesia Quick Evaluation

## 2021-02-18 ENCOUNTER — Encounter: Payer: Self-pay | Admitting: Surgery

## 2021-02-18 LAB — SURGICAL PATHOLOGY

## 2021-02-21 ENCOUNTER — Other Ambulatory Visit: Payer: Self-pay

## 2021-02-28 ENCOUNTER — Encounter: Payer: Self-pay | Admitting: Surgery

## 2021-02-28 ENCOUNTER — Ambulatory Visit (INDEPENDENT_AMBULATORY_CARE_PROVIDER_SITE_OTHER): Payer: 59 | Admitting: Surgery

## 2021-02-28 ENCOUNTER — Other Ambulatory Visit: Payer: Self-pay

## 2021-02-28 VITALS — BP 138/82 | HR 97 | Temp 98.5°F | Ht 66.0 in | Wt 238.0 lb

## 2021-02-28 DIAGNOSIS — Z09 Encounter for follow-up examination after completed treatment for conditions other than malignant neoplasm: Secondary | ICD-10-CM

## 2021-02-28 NOTE — Patient Instructions (Addendum)
If you have any concerns or questions, please feel free to call our office.   GENERAL POST-OPERATIVE PATIENT INSTRUCTIONS   WOUND CARE INSTRUCTIONS:  Keep a dry clean dressing on the wound if there is drainage. The initial bandage may be removed after 24 hours.  Once the wound has quit draining you may leave it open to air.  If clothing rubs against the wound or causes irritation and the wound is not draining you may cover it with a dry dressing during the daytime.  Try to keep the wound dry and avoid ointments on the wound unless directed to do so.  If the wound becomes bright red and painful or starts to drain infected material that is not clear, please contact your physician immediately.  If the wound is mildly pink and has a thick firm ridge underneath it, this is normal, and is referred to as a healing ridge.  This will resolve over the next 4-6 weeks.  BATHING: You may shower if you have been informed of this by your surgeon. However, Please do not submerge in a tub, hot tub, or pool until incisions are completely sealed or have been told by your surgeon that you may do so.  DIET:  You may eat any foods that you can tolerate.  It is a good idea to eat a high fiber diet and take in plenty of fluids to prevent constipation.  If you do become constipated you may want to take a mild laxative or take ducolax tablets on a daily basis until your bowel habits are regular.  Constipation can be very uncomfortable, along with straining, after recent surgery.  ACTIVITY:  You are encouraged to cough and deep breath or use your incentive spirometer if you were given one, every 15-30 minutes when awake.  This will help prevent respiratory complications and low grade fevers post-operatively if you had a general anesthetic.  You may want to hug a pillow when coughing and sneezing to add additional support to the surgical area, if you had abdominal or chest surgery, which will decrease pain during these times.  You  are encouraged to walk and engage in light activity for the next two weeks.  You should not lift more than 15 pounds, until 03/17/2021 as it could put you at increased risk for complications.  Twenty pounds is roughly equivalent to a plastic bag of groceries. At that time- Listen to your body when lifting, if you have pain when lifting, stop and then try again in a few days. Soreness after doing exercises or activities of daily living is normal as you get back in to your normal routine.  MEDICATIONS:  Try to take narcotic medications and anti-inflammatory medications, such as tylenol, ibuprofen, naprosyn, etc., with food.  This will minimize stomach upset from the medication.  Should you develop nausea and vomiting from the pain medication, or develop a rash, please discontinue the medication and contact your physician.  You should not drive, make important decisions, or operate machinery when taking narcotic pain medication.  SUNBLOCK Use sun block to incision area over the next year if this area will be exposed to sun. This helps decrease scarring and will allow you avoid a permanent darkened area over your incision.

## 2021-03-02 NOTE — Progress Notes (Signed)
Linda Vega is a 45 year old female status post laparoscopic cecectomy for an appendiceal mass.  Final pathology consistent with lipoma.  She is doing okay no fevers no chills tolerating diet.  Some abdominal discomfort that is expected.  PE NAD Abd: Soft, vision healing well without evidence of infection.  No hernias no peritonitis  A/P  No complications related to her surgery.  We will follow her up in a few months. She is a bit anxious regarding her pathology.  We will see her back in a few months

## 2021-03-07 ENCOUNTER — Encounter: Payer: 59 | Admitting: Surgery

## 2021-03-18 ENCOUNTER — Encounter: Payer: Self-pay | Admitting: Family Medicine

## 2021-03-24 ENCOUNTER — Ambulatory Visit: Payer: 59

## 2021-03-28 ENCOUNTER — Ambulatory Visit (INDEPENDENT_AMBULATORY_CARE_PROVIDER_SITE_OTHER): Payer: 59

## 2021-03-28 ENCOUNTER — Other Ambulatory Visit: Payer: Self-pay

## 2021-03-28 DIAGNOSIS — Z23 Encounter for immunization: Secondary | ICD-10-CM | POA: Diagnosis not present

## 2021-04-20 ENCOUNTER — Encounter: Payer: Self-pay | Admitting: Family Medicine

## 2021-04-20 DIAGNOSIS — K219 Gastro-esophageal reflux disease without esophagitis: Secondary | ICD-10-CM

## 2021-05-09 ENCOUNTER — Other Ambulatory Visit: Payer: Self-pay

## 2021-05-09 MED ORDER — OMEPRAZOLE 20 MG PO CPDR
20.0000 mg | DELAYED_RELEASE_CAPSULE | Freq: Two times a day (BID) | ORAL | 3 refills | Status: DC
  Filled 2021-05-09 – 2021-05-10 (×2): qty 180, 90d supply, fill #0

## 2021-05-09 NOTE — Addendum Note (Signed)
Addended by: Olin Hauser on: 05/09/2021 06:20 PM   Modules accepted: Orders

## 2021-05-10 ENCOUNTER — Other Ambulatory Visit: Payer: Self-pay

## 2021-05-11 ENCOUNTER — Other Ambulatory Visit: Payer: Self-pay

## 2021-06-01 ENCOUNTER — Encounter: Payer: 59 | Admitting: Surgery

## 2021-06-16 ENCOUNTER — Ambulatory Visit: Payer: 59 | Admitting: Internal Medicine

## 2021-06-16 ENCOUNTER — Other Ambulatory Visit: Payer: Self-pay

## 2021-06-16 ENCOUNTER — Encounter: Payer: Self-pay | Admitting: Internal Medicine

## 2021-06-16 VITALS — BP 135/81 | HR 93 | Temp 98.4°F | Resp 17 | Ht 66.0 in | Wt 238.0 lb

## 2021-06-16 DIAGNOSIS — B9789 Other viral agents as the cause of diseases classified elsewhere: Secondary | ICD-10-CM

## 2021-06-16 DIAGNOSIS — J329 Chronic sinusitis, unspecified: Secondary | ICD-10-CM | POA: Diagnosis not present

## 2021-06-16 MED ORDER — CETIRIZINE HCL 10 MG PO TABS
10.0000 mg | ORAL_TABLET | Freq: Every day | ORAL | 11 refills | Status: DC
  Filled 2021-06-16 – 2022-05-05 (×3): qty 30, 30d supply, fill #0

## 2021-06-16 MED ORDER — FLUTICASONE PROPIONATE 50 MCG/ACT NA SUSP
2.0000 | Freq: Every day | NASAL | 3 refills | Status: DC
  Filled 2021-06-16: qty 16, 30d supply, fill #0

## 2021-06-16 MED ORDER — PREDNISONE 10 MG PO TABS
ORAL_TABLET | ORAL | 0 refills | Status: DC
  Filled 2021-06-16: qty 21, 6d supply, fill #0

## 2021-06-16 NOTE — Progress Notes (Signed)
HPI  Patient presents the clinic today with complaint of intermittent headache, sneezing, nasal congestion, ringing in the ears, cough and chest congestion.  She reports this started 1 week ago.  The headache is in different places at different times.  She is not blowing anything out of her nose.  The cough is mostly nonproductive.  She denies sore throat, shortness of breath.  She has had some loose stools but denies nausea or vomiting.  She denies fever or chills but has had body aches in her neck, shoulders and upper arms.  She has tried Mucinex and Tylenol OTC with minimal relief of symptoms.  She has a history of allergies but reports this feels different.  She has not had sick contacts that she is aware of although she does work in a [employer].  She does not smoke.  She is up-to-date with her flu and COVID vaccines.  Review of Systems     Past Medical History:  Diagnosis Date   Anxiety    Arthritis    DDD   BRCA negative 12/2019   MyRisk neg except AXIN2 VUS   Carpal tunnel syndrome right   Family history of breast cancer 12/2019   IBIS=16.7%   GERD (gastroesophageal reflux disease)    Headache    h/o migraines   Hypertension     Family History  Problem Relation Age of Onset   Hypertension Mother    Breast cancer Maternal Aunt        87s   Breast cancer Maternal Grandmother 20   Breast cancer Maternal Aunt        31s    Social History   Socioeconomic History   Marital status: Significant Other    Spouse name: Not on file   Number of children: Not on file   Years of education: Not on file   Highest education level: Not on file  Occupational History   Occupation: Office Rep/Admin    Comment: [employer]  Tobacco Use   Smoking status: Never   Smokeless tobacco: Never  Vaping Use   Vaping Use: Never used  Substance and Sexual Activity   Alcohol use: Yes    Comment: occasional, about 3x a month   Drug use: No   Sexual activity: Not Currently     Birth control/protection: None  Other Topics Concern   Not on file  Social History Narrative   ** Merged History Encounter **       Social Determinants of Health   Financial Resource Strain: Not on file  Food Insecurity: Not on file  Transportation Needs: Not on file  Physical Activity: Not on file  Stress: Not on file  Social Connections: Not on file  Intimate Partner Violence: Not on file    Allergies  Allergen Reactions   Shellfish Allergy Hives, Hypertension, Itching, Palpitations, Shortness Of Breath, Swelling and Anaphylaxis   Eggs Or Egg-Derived Products Hives   Lactose Intolerance (Gi) Diarrhea    Upset stomach, bloating      Constitutional: Positive headache. Denies fatigue, fever abrupt weight changes.  HEENT:  Positive nasal congestion and ringing in the ears. Denies eye redness, ear pain,  wax buildup, runny nose or sore throat. Respiratory: Positive cough. Denies difficulty breathing or shortness of breath.  Cardiovascular: Denies chest pain, chest tightness, palpitations or swelling in the hands or feet.  GI: Patient reports loose stools.  Denies nausea, vomiting, constipation or blood in her stool.  No other specific complaints in a  complete review of systems (except as listed in HPI above).  Objective:   BP 135/81 (BP Location: Right Arm, Patient Position: Sitting, Cuff Size: Large)   Pulse 93   Temp 98.4 F (36.9 C) (Temporal)   Resp 17   Ht 5' 6"  (1.676 m)   Wt 238 lb (108 kg)   LMP 10/04/2019 (Exact Date)   SpO2 100%   BMI 38.41 kg/m    General: Appears her stated age, well developed, well nourished in NAD. HEENT: Head: normal shape and size, no sinus tenderness noted; Eyes: sclera white, no icterus, conjunctiva pink; Ears: Tm's gray and intact, normal light reflex; Throat/Mouth: + PND. Teeth present, mucosa erythematous and moist, no exudate noted, no lesions or ulcerations noted.  Neck:  No adenopathy noted.  Cardiovascular: Normal rate and  rhythm. S1,S2 noted.  No murmur, rubs or gallops noted.  Pulmonary/Chest: Normal effort and positive vesicular breath sounds. No respiratory distress. No wheezes, rales or ronchi noted.       Assessment & Plan:  Viral Sinusitis:  Can use a Neti Pot which can be purchased from your local drug store. Flonase 2 sprays each nostril for 3 days and then as needed. Zyrtec 10 mg daily at bedtime Rx for Pred taper x6 days  RTC as needed or if symptoms persist. Webb Silversmith, NP This visit occurred during the SARS-CoV-2 public health emergency.  Safety protocols were in place, including screening questions prior to the visit, additional usage of staff PPE, and extensive cleaning of exam room while observing appropriate contact time as indicated for disinfecting solutions.

## 2021-06-16 NOTE — Patient Instructions (Signed)

## 2021-08-11 ENCOUNTER — Other Ambulatory Visit: Payer: Self-pay | Admitting: Family Medicine

## 2021-08-11 ENCOUNTER — Other Ambulatory Visit: Payer: Self-pay

## 2021-08-11 DIAGNOSIS — A6000 Herpesviral infection of urogenital system, unspecified: Secondary | ICD-10-CM

## 2021-08-11 MED ORDER — VALACYCLOVIR HCL 1 G PO TABS
ORAL_TABLET | ORAL | 3 refills | Status: DC
  Filled 2021-08-11: qty 20, 4d supply, fill #0
  Filled 2022-02-08: qty 20, 4d supply, fill #1
  Filled 2022-05-05: qty 20, 4d supply, fill #2

## 2021-08-11 NOTE — Telephone Encounter (Signed)
Requested medication (s) are due for refill today: yes  Requested medication (s) are on the active medication list: yes  Last refill:  09/15/20  Future visit scheduled: yes  Notes to clinic:  med was dc'd on 02/02/21. Please advise for refill     Requested Prescriptions  Pending Prescriptions Disp Refills   valACYclovir (VALTREX) 1000 MG tablet 20 tablet 3    Sig: TAKE 1/2 TABLET BY MOUTH 2 TIMES A DAY FOR 3 TO 5 DAYS AS NEEDED FOR FLARE     Antimicrobials:  Antiviral Agents - Anti-Herpetic Passed - 08/11/2021  1:35 PM      Passed - Valid encounter within last 12 months    Recent Outpatient Visits           1 month ago Viral sinusitis   [facility] [place], Coralie Keens, NP   6 months ago Annual physical exam   [facility] Olin Hauser, DO   1 year ago Suspected COVID-19 virus infection   [place], DO   1 year ago Chronic pain of right wrist   [facility] Olin Hauser, DO   1 year ago Paresthesia of left upper extremity   [facility] Olin Hauser, DO       Future Appointments             In 6 months [place], Devonne Doughty, [facility], [facility]

## 2021-08-18 IMAGING — [facility] BREAST LOC DEV 1ST LESION INC MAMMO GUIDE*L*
8 of 11 series · 8 of 11 positions shown · non-contrast
Comparison: Previous exam(s)

CLINICAL DATA: Biopsy proven fibroadenoma with focal atypical
lobular hyperplasia in the 3 o'clock region of the left breast and a
complex fibroadenoma from the 5 o'clock region of the left breast.
Radiofrequency takes placed prior to surgical excision.

EXAM:
MAMMOGRAPHIC GUIDED RADIOFREQUENCY DEVICE
LOCALIZATIONS OF THE LEFT BREAST

[L LM (1 of 5)]
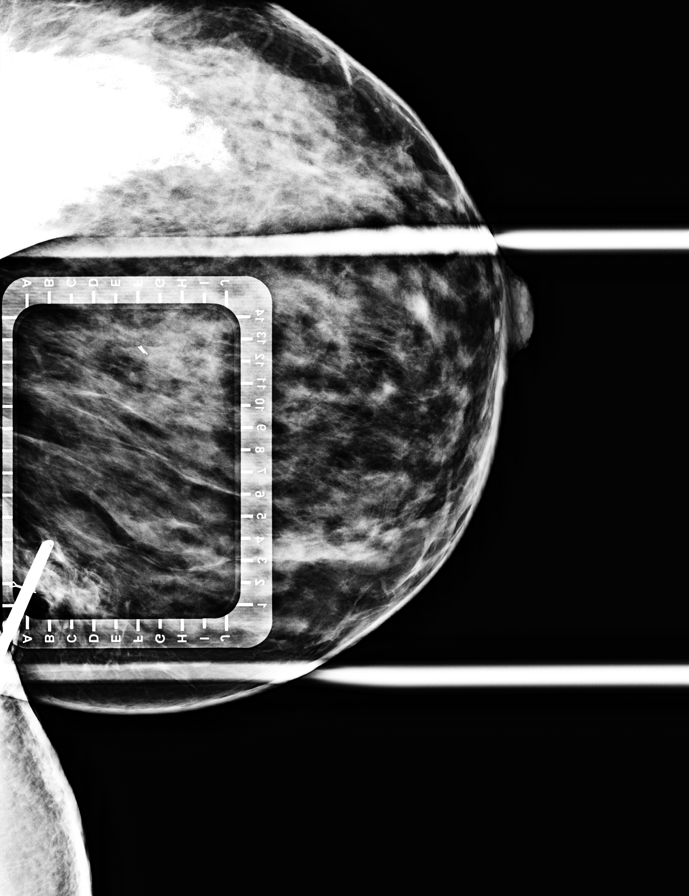

[L LM (2 of 5)]
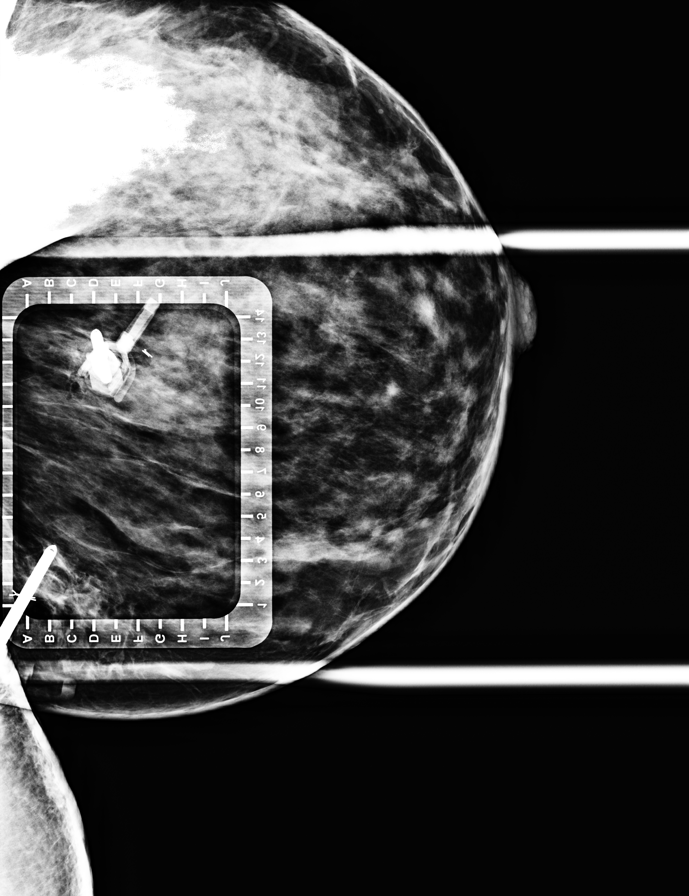

[L LM (3 of 5)]
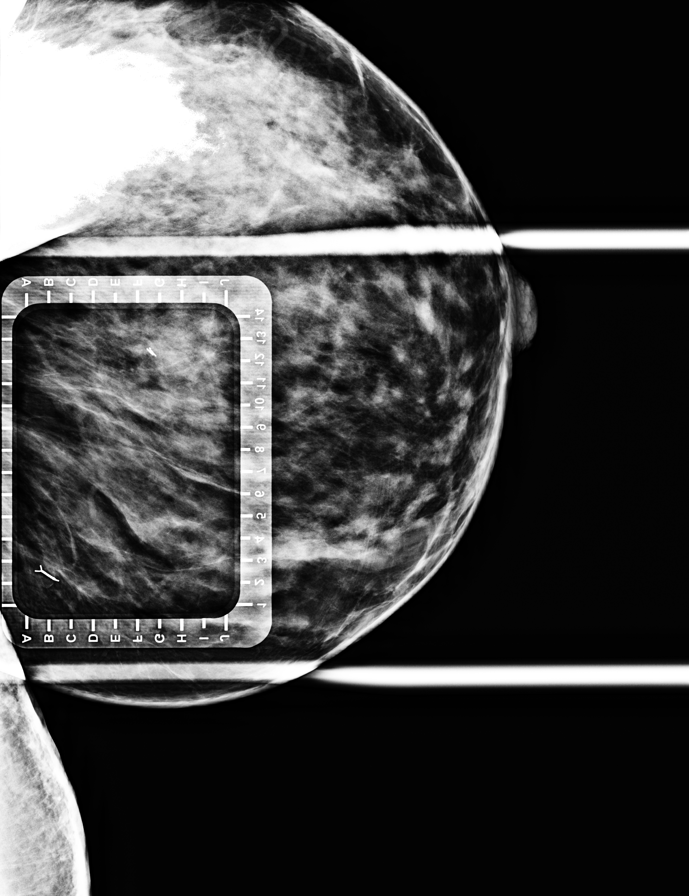

[L LM (4 of 5)]
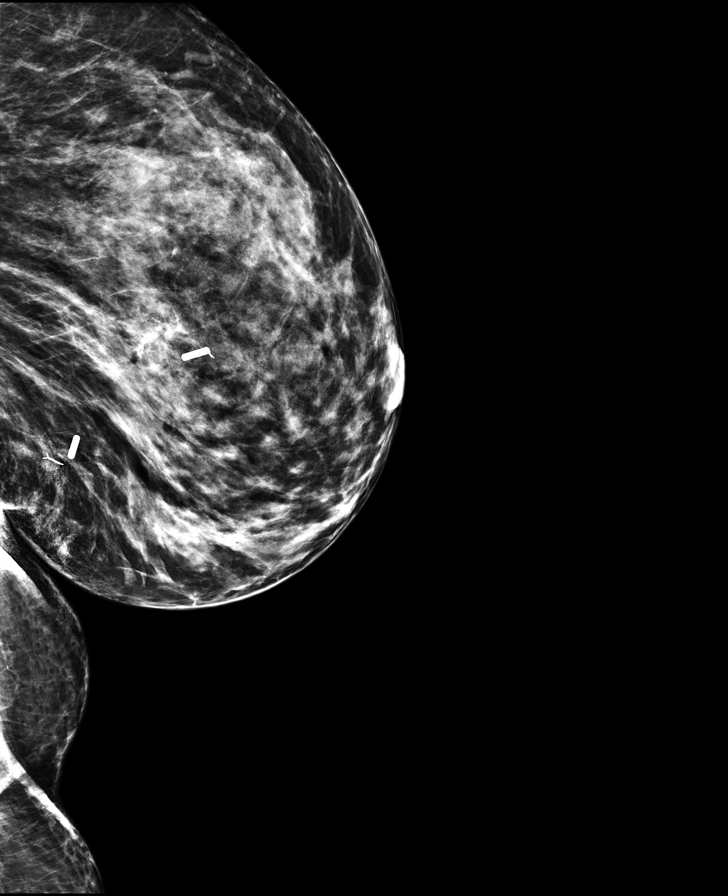

[L CC (1 of 3)]
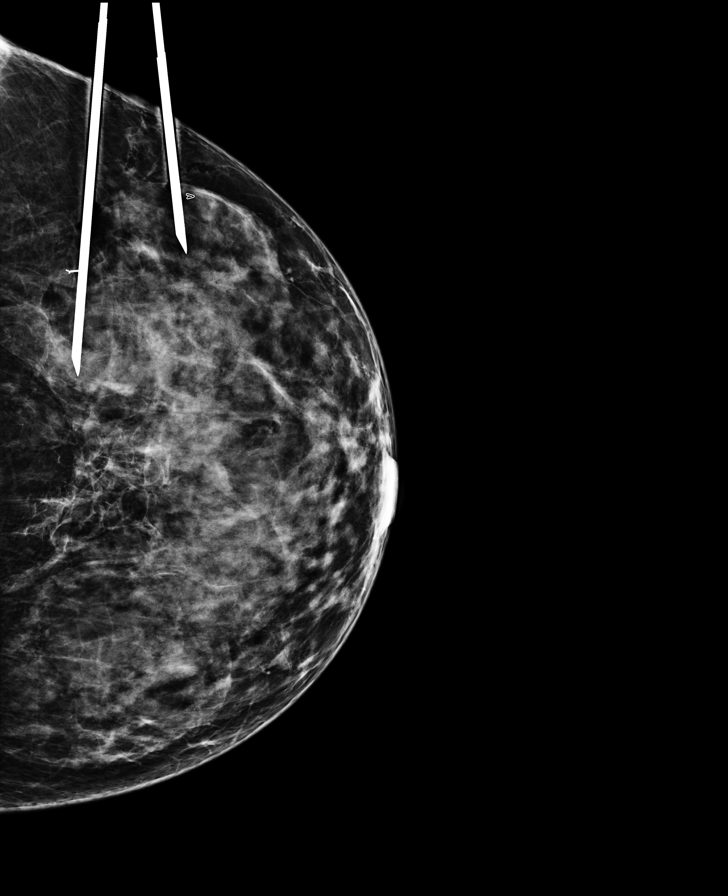

[L CC (2 of 3)]
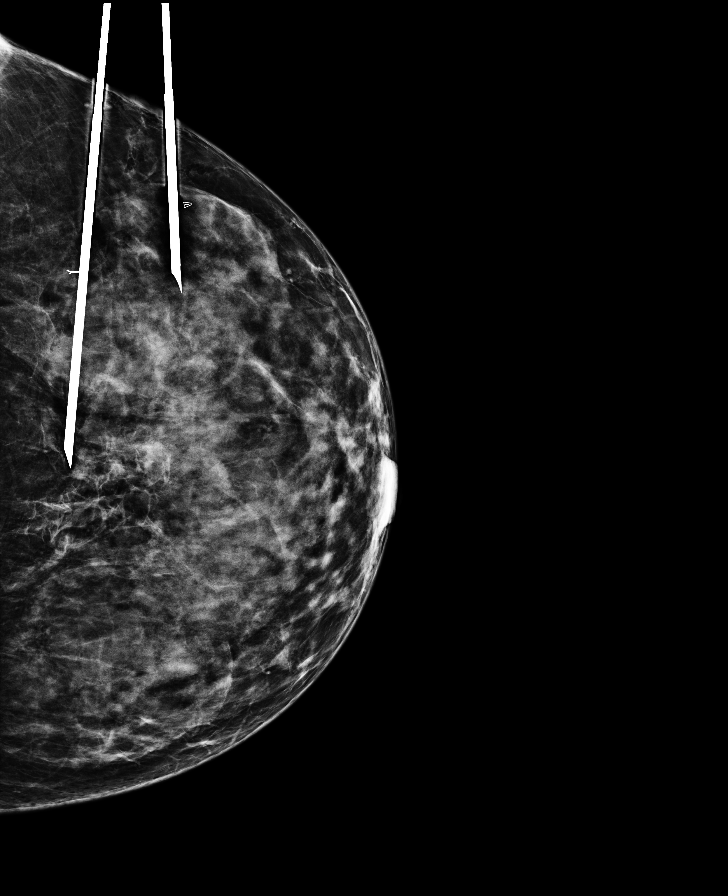

[L CC (3 of 3)]
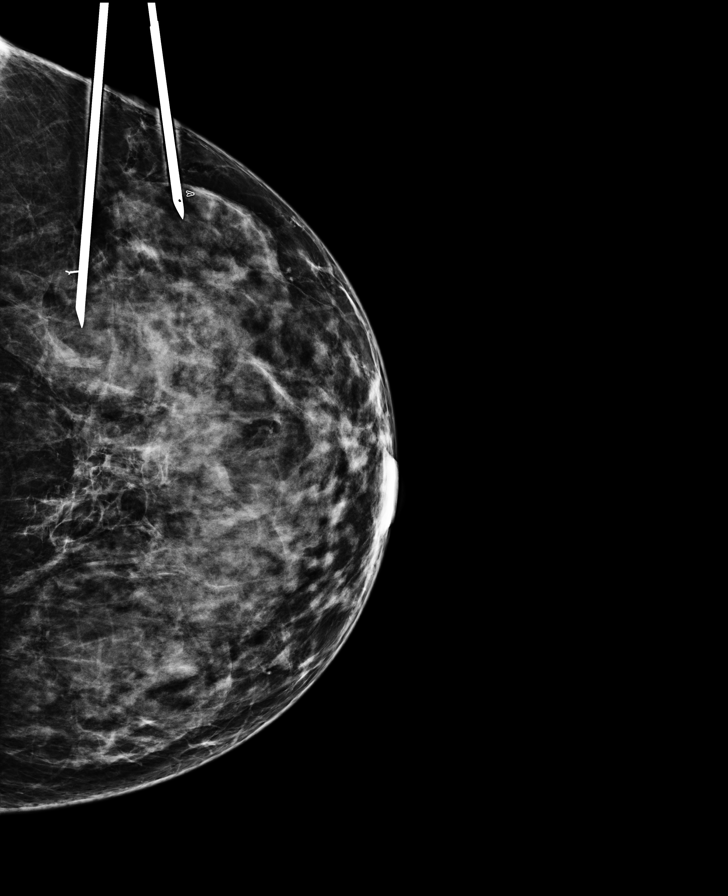

[L LM (5 of 5)]
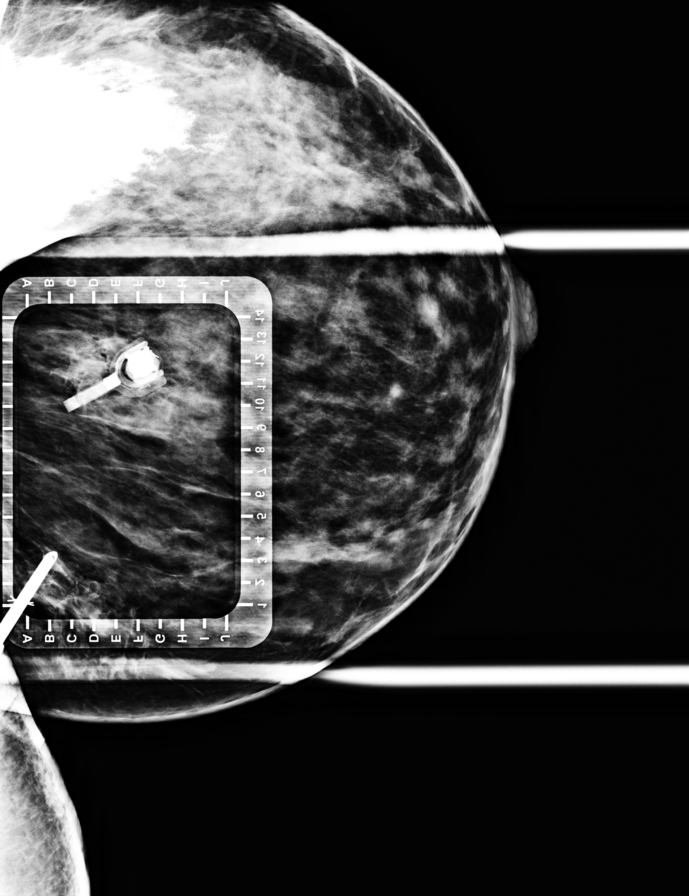

[8 of 11 positions shown; findings below may reference images not displayed]

FINDINGS: Patient presents for radiofrequency device localization prior to
surgery. I met with the patient and we discussed the procedure of
radiofrequency device localization including benefits and
alternatives. We discussed the high likelihood of a successful
procedure. We discussed the risks of the procedure including
infection, bleeding, tissue injury and further surgery. Informed,
written consent was given.

The usual time-out protocol was performed immediately prior to the
procedure.

Using mammographic guidance, sterile technique, 1% lidocaine as
local anesthesia, a radiofrequency tag # [id number] was used to localize
X shaped using a lateral to medial approach.

The follow-up mammogram images confirm that the RF device is in the
expected location and are marked for Dr. Keumars.

Follow-up survey of the patient confirms the presence of the RF
device.

The patient tolerated the procedure well and was released from the
[REDACTED].

Patient presents for radiofrequency device localization prior to
surgery. I met with the patient and we discussed the procedure of
radiofrequency device localization including benefits and
alternatives. We discussed the high likelihood of a successful
procedure. We discussed the risks of the procedure including
infection, bleeding, tissue injury and further surgery. Informed,
written consent was given.

The usual time-out protocol was performed immediately prior to the
procedure.

Using mammographic guidance, sterile technique, 1% lidocaine as
local anesthesia, a radiofrequency tag # [id number] was used to localize
heart shaped using a lateral to medial approach.

The follow-up mammogram images confirm that the RF device is in the
expected location and are marked for Dr. Keumars.

Follow-up survey of the patient confirms the presence of the RF
device.

The patient tolerated the procedure well and was released from the
[REDACTED].
IMPRESSION: Radiofrequency device localizations of the LEFT breast. No apparent
complications.

## 2021-10-19 ENCOUNTER — Ambulatory Visit: Payer: Self-pay | Admitting: *Deleted

## 2021-10-19 NOTE — Telephone Encounter (Signed)
Per agent: ?"Patient called in needing an appointment complains of back pain, right side discomfort, frequent urination and foul odor say she think its a UTI. No appointments please advise and call patient at Ph# [phone]" ? ? ?Chief Complaint: UTI Symptoms ?Symptoms: Frequency, urgency, foul odor to urine, lower back discomfort at times ?Frequency: 2 weeks ago ?Pertinent Negatives: Patient denies fever ?Disposition: [] ED /[] Urgent Care (no appt availability in office) / [x] Appointment(In office/virtual)/ []  [facility]/ [] Home Care/ [] Refused Recommended Disposition /[] [facility]/ []  Follow-up with PCP ?Additional Notes: Scheduled first available with Junie Panning, tomorrow. Care advise given, ED for worsening pain,fever. ?Reason for Disposition ? [1] Painful urination AND [2] EITHER frequency or urgency AND [3] has on-call doctor ? ?Answer Assessment - Initial Assessment Questions ?1. SEVERITY: "How bad is the pain?"  (e.g., Scale 1-10; mild, moderate, or severe) ?  - MILD (1-3): complains slightly about urination hurting ?  - MODERATE (4-7): interferes with normal activities   ?  - SEVERE (8-10): excruciating, unwilling or unable to urinate because of the pain  ?    Lower back pain 5/10, comes and goes ?2. FREQUENCY: "How many times have you had painful urination today?"  ?    None ?3. PATTERN: "Is pain present every time you urinate or just sometimes?"  ?     ?4. ONSET: "When did the painful urination start?"  ?    2  weeks ago ?5. FEVER: "Do you have a fever?" If Yes, ask: "What is your temperature, how was it measured, and when did it start?" ?    Maybe other day, chills, sweating ?6. PAST UTI: "Have you had a urine infection before?" If Yes, ask: "When was the last time?" and "What happened that time?"  ?    no ?7. CAUSE: "What do you think is causing the painful urination?"  (e.g., UTI, scratch, Herpes sore) ?     ?8. OTHER SYMPTOMS: "Do you have any other symptoms?" (e.g., flank pain,  vaginal discharge, genital sores, urgency, blood in urine) ?    Frequency, foul odor, urgency. ? ?Protocols used: Urination Pain - Female-A-AH ? ?

## 2021-10-20 ENCOUNTER — Ambulatory Visit: Payer: 59 | Admitting: Physician Assistant

## 2021-11-09 ENCOUNTER — Other Ambulatory Visit: Payer: Self-pay

## 2021-11-11 ENCOUNTER — Ambulatory Visit (INDEPENDENT_AMBULATORY_CARE_PROVIDER_SITE_OTHER): Payer: No Typology Code available for payment source | Admitting: Family Medicine

## 2021-11-11 ENCOUNTER — Encounter: Payer: Self-pay | Admitting: Family Medicine

## 2021-11-11 ENCOUNTER — Other Ambulatory Visit: Payer: Self-pay

## 2021-11-11 VITALS — BP 128/84 | HR 94 | Ht 66.0 in | Wt 242.4 lb

## 2021-11-11 DIAGNOSIS — M4726 Other spondylosis with radiculopathy, lumbar region: Secondary | ICD-10-CM | POA: Diagnosis not present

## 2021-11-11 DIAGNOSIS — R350 Frequency of micturition: Secondary | ICD-10-CM

## 2021-11-11 DIAGNOSIS — M48061 Spinal stenosis, lumbar region without neurogenic claudication: Secondary | ICD-10-CM | POA: Diagnosis not present

## 2021-11-11 DIAGNOSIS — M62830 Muscle spasm of back: Secondary | ICD-10-CM

## 2021-11-11 LAB — POCT URINALYSIS DIPSTICK
Bilirubin, UA: NEGATIVE
Blood, UA: NEGATIVE
Glucose, UA: NEGATIVE
Ketones, UA: NEGATIVE
Leukocytes, UA: NEGATIVE
Nitrite, UA: NEGATIVE
Protein, UA: NEGATIVE
Spec Grav, UA: 1.02 (ref 1.010–1.025)
Urobilinogen, UA: 0.2 E.U./dL
pH, UA: 7 (ref 5.0–8.0)

## 2021-11-11 MED ORDER — BACLOFEN 10 MG PO TABS
5.0000 mg | ORAL_TABLET | Freq: Three times a day (TID) | ORAL | 1 refills | Status: DC | PRN
Start: 2021-11-11 — End: 2023-09-14
  Filled 2021-11-11: qty 90, 30d supply, fill #0

## 2021-11-11 NOTE — Progress Notes (Signed)
? ?Subjective:  ? ? Patient ID: Linda Vega, female    DOB: 09/28/1975, 46 y.o.   MRN: [id number] ? ?Linda Vega is a 46 y.o. female presenting on 11/11/2021 for Back Pain and Hip Pain ? ?Patient presents for a same day appointment. ? ?HPI ? ?Urinary Frequency ?Increasing water intake up to 90 oz per day approx. Too frequent urination, with going to bathroom every 10 min approx ?Denies dysuria or burning, hematuria blood in urine, fever chills ? ?Chronic Back Pain ?Lumbar DDD ?Chronic problem now for several years, prior imaging Lumbar X-ray 2020, has had CT Abdomen that showed degenerative disc and spinal stenosis lumbar L5-S1 see below ?Describes some radiating pain from low back, has had hip worked up with negative x-rays in past. Describes variety of other assoc symptoms see above with bladder, and lower extremity radiating pain. ?Has taken Gabapentin, Prednisone before for back. ?Not on muscle relaxant. ?[facility]opedics at [facility] for C-spine neck. Has not been seen for back ? ? ?  02/01/2021  ?  4:37 PM 01/14/2020  ?  2:26 PM 09/12/2019  ?  2:24 PM  ?Depression screen PHQ 2/9  ?Decreased Interest 0 0 0  ?Down, Depressed, Hopeless 0 0 0  ?PHQ - 2 Score 0 0 0  ?Altered sleeping   0  ?Tired, decreased energy   0  ?Change in appetite   0  ?Feeling bad or failure about yourself    0  ?Trouble concentrating   1  ?Moving slowly or fidgety/restless   0  ?Suicidal thoughts   0  ?PHQ-9 Score   1  ?Difficult doing work/chores   Very difficult  ? ? ?Social History  ? ?Tobacco Use  ? Smoking status: Never  ? Smokeless tobacco: Never  ?Vaping Use  ? Vaping Use: Never used  ?Substance Use Topics  ? Alcohol use: Yes  ?  Comment: occasional, about 3x a month  ? Drug use: No  ? ? ?Review of Systems ?Per HPI unless specifically indicated above ? ?   ?Objective:  ?  ?BP 128/84   Pulse 94   Ht 5' 6"  (1.676 m)   Wt 242 lb 6.4 oz (110 kg)   LMP 10/04/2019 (Exact Date)   SpO2 99%   BMI 39.12 kg/m?   ?Wt Readings from  Last 3 Encounters:  ?11/11/21 242 lb 6.4 oz (110 kg)  ?06/16/21 238 lb (108 kg)  ?02/28/21 238 lb (108 kg)  ?  ?Physical Exam ?Vitals and nursing note reviewed.  ?Constitutional:   ?   General: She is not in acute distress. ?   Appearance: Normal appearance. She is well-developed. She is not diaphoretic.  ?   Comments: Well-appearing, comfortable, cooperative  ?HENT:  ?   Head: Normocephalic and atraumatic.  ?Eyes:  ?   General:     ?   Right eye: No discharge.     ?   Left eye: No discharge.  ?   Conjunctiva/sclera: Conjunctivae normal.  ?Cardiovascular:  ?   Rate and Rhythm: Normal rate.  ?Pulmonary:  ?   Effort: Pulmonary effort is normal.  ?Skin: ?   General: Skin is warm and dry.  ?   Findings: No erythema or rash.  ?Neurological:  ?   Mental Status: She is alert and oriented to person, place, and time.  ?Psychiatric:     ?   Mood and Affect: Mood normal.     ?   Behavior: Behavior normal.     ?  Thought Content: Thought content normal.  ?   Comments: Well groomed, good eye contact, normal speech and thoughts  ? ? ?I have personally reviewed the radiology report from CT Abdomen 01/28/21 ? ?CT Abdomen Pelvis W Contrast (Accession [id number]) (Order [id number]) ?Imaging ?Date: 01/28/2021 Department: [facility] Released By: Janan Ridge Authorizing: Lucilla Lame, MD  ? ?Exam Status ? ?Status  ?Final [99]  ? ?PACS Intelerad Image Link ? ? Show images for CT Abdomen Pelvis W Contrast ? ?Study Result ? ?Narrative & Impression  ?CLINICAL DATA:  Extrinsic compression of the colon wall at the ?appendiceal orifice on colonoscopy dated 01/17/2021. ?  ?EXAM: ?CT ABDOMEN AND PELVIS WITH CONTRAST ?  ?TECHNIQUE: ?Multidetector CT imaging of the abdomen and pelvis was performed ?using the standard protocol following bolus administration of ?intravenous contrast. ?  ?CONTRAST:  125m OMNIPAQUE IOHEXOL 300 MG/ML  SOLN ?  ?COMPARISON:  None. ?  ?FINDINGS: ?Lower chest: Unremarkable ?   ?Hepatobiliary: Unremarkable ?  ?Pancreas: Unremarkable ?  ?Spleen: Unremarkable ?  ?Adrenals/Urinary Tract: 1.0 by 1.0 cm nodule of the lateral limb ?right adrenal gland on image 23 of series 2 is technically ?nonspecific (although statistically likely to be a small benign ?adenoma). 1.0 by 0.7 cm hypodense lesion in the left mid kidney ?anterolaterally on 11 series 9 is likely a cyst or similar benign ?lesion although technically too small to characterize. ?  ?Stomach/Bowel: The appendix appears abnormal, with a somewhat ?rounded 1.8 cm nodular component at the appendiceal orifice, and ?with appendiceal wall thickening and mild dilatation in the rest of ?the appendix with appendiceal diameter at 1.2 cm, some central lower ?density along the appendiceal lumen although less than is often ?encountered in the setting of a mucocele. Appearance suspicious for ?appendiceal mass or mucocele, superimposed chronic appendicitis ?cannot be readily excluded given the suggestion of wall thickening. ?  ?Vascular/Lymphatic: Unremarkable ?  ?Reproductive: Uterus absent. 5.0 by 4.7 by 4.2 cm (volume = 52 ?cm^3) homogeneous exophytic lesion of the left ovary has a higher ?contrast density than the contents of the urinary bladder, ?compatible with a mildly complex cystic lesion. On 08/14/2019, a ?left ovarian cystic lesion measured up to 3.2 cm in long axis and ?had some faint internal echoes. ?  ?There is a suggestion of mild wall thickening of the vaginal wall, ?possibly incidental although an inflammatory process is not ?excluded. ?  ?Other: No supplemental non-categorized findings. ?  ?Musculoskeletal: Chronic left unilateral pars defect at L5 with 5 mm ?of grade 1 anterolisthesis of L5 on S1, degenerative endplate ?findings and degenerative disc disease at L5-S1, and moderate to ?prominent right foraminal stenosis at the L5-S1 level. There is also ?mild left and borderline right foraminal stenosis at L4-5 due to ?facet  spurring. ?  ?IMPRESSION: ?1. Abnormal wall thickening in the appendix associated with a ?rounded masslike protuberance near the appendiceal orifice. This ?probably represents a small appendiceal mass with some associated ?chronic appendicitis. Mucocele is not entirely excluded although ?more commonly mucocele on CT has a distended fluid density ?appearance rather than the higher density appearance present today. ?In most circumstances, elective appendectomy would be appropriate. ?2. Mildly complex but likely benign left ovarian cystic lesion ?measuring about 52 cubic cm. A similar appearing lesion measuring ?about 10 cubic cm was shown on the pelvic ultrasound of 08/14/2019. ?Consider follow up pelvic sonography to re-evaluate. ?3. Chronic left pars defect at L5 with 5 mm grade 1 anterolisthesis ?of L5 on S1. There is  moderate to prominent right foraminal ?impingement at L5-S1 mild left foraminal impingement at L4-5. ?4. 1.0 cm nodule of the lateral limb right adrenal gland, probably a ?small benign adenoma but technically nonspecific. ?  ?  ?Electronically Signed ?  By: Van Clines M.D. ?  On: 01/29/2021 15:47 ?  ? ? ?Results for orders placed or performed in visit on 11/11/21  ?POCT urinalysis dipstick  ?Result Value Ref Range  ? Color, UA Yellow   ? Clarity, UA Clear   ? Glucose, UA Negative Negative  ? Bilirubin, UA Negative   ? Ketones, UA Negative   ? Spec Grav, UA 1.020 1.010 - 1.025  ? Blood, UA Negative   ? pH, UA 7.0 5.0 - 8.0  ? Protein, UA Negative Negative  ? Urobilinogen, UA 0.2 0.2 or 1.0 E.U./dL  ? Nitrite, UA Negative   ? Leukocytes, UA Negative Negative  ? Appearance    ? Odor    ? ?   ?Assessment & Plan:  ? ?Problem List Items Addressed This Visit   ? ? Osteoarthritis of spine with radiculopathy, lumbar region  ? Relevant Medications  ? baclofen (LIORESAL) 10 MG tablet  ? ?Other Visit Diagnoses   ? ? Urinary frequency    -  Primary  ? Relevant Orders  ? POCT urinalysis dipstick (Completed)   ? Muscle spasm of back      ? Relevant Medications  ? baclofen (LIORESAL) 10 MG tablet  ? ?  ?  ?Urine today is normal. No sign of infection or blood. ? ?Reduce water intake to 30 to 60 oz per day. ? ?Could be overact

## 2021-11-11 NOTE — Patient Instructions (Addendum)
Thank you for coming to the office today. ? ?Urine today is normal. No sign of infection or blood. ? ?Reduce water intake to 30 to 60 oz per day. ? ?Start taking Baclofen (Lioresal) 17m (muscle relaxant) - start with half (cut) to one whole pill at night as needed for next 1-3 nights (may make you drowsy, caution with driving) see how it affects you, then if tolerated increase to one pill 2 to 3 times a day or (every 8 hours as needed) ? ?Could be overactive bladder. Since there is no urinary tract infection. ?This can cause bladder spasms and discomfort and frequent urination and sometimes incontinence ? ?If reducing water intake and treating the back don't help, then we can consider the following ? ?Let me know if you want to try a medicine ? ?Oxybutynin XL low dose - risk of side effects (dry mouth eyes constipation) ? ?Next level is Myrbetriq XL 24 hour pill - very good results for bladder symptoms, but higher cost. Less side effects ? ?Consider Referral to Urologist - if you don't want to mess with medication. ? ? ?Please schedule a Follow-up Appointment to: Return if symptoms worsen or fail to improve. ? ?If you have any other questions or concerns, please feel free to call the office or send a message through [place] You may also schedule an earlier appointment if necessary. ? ?Additionally, you may be receiving a survey about your experience at our office within a few days to 1 week by e-mail or mail. We value your feedback. ? ?ANobie Putnam DO ?[place]?

## 2021-12-09 ENCOUNTER — Other Ambulatory Visit: Payer: Self-pay

## 2021-12-30 ENCOUNTER — Ambulatory Visit (INDEPENDENT_AMBULATORY_CARE_PROVIDER_SITE_OTHER): Payer: No Typology Code available for payment source | Admitting: Family Medicine

## 2021-12-30 ENCOUNTER — Other Ambulatory Visit: Payer: Self-pay

## 2021-12-30 ENCOUNTER — Encounter: Payer: Self-pay | Admitting: Family Medicine

## 2021-12-30 VITALS — BP 128/84 | HR 103 | Ht 66.0 in | Wt 234.6 lb

## 2021-12-30 DIAGNOSIS — J020 Streptococcal pharyngitis: Secondary | ICD-10-CM | POA: Diagnosis not present

## 2021-12-30 DIAGNOSIS — J358 Other chronic diseases of tonsils and adenoids: Secondary | ICD-10-CM | POA: Diagnosis not present

## 2021-12-30 DIAGNOSIS — J039 Acute tonsillitis, unspecified: Secondary | ICD-10-CM

## 2021-12-30 LAB — POCT RAPID STREP A (OFFICE): Rapid Strep A Screen: NEGATIVE

## 2021-12-30 MED ORDER — AMOXICILLIN-POT CLAVULANATE 875-125 MG PO TABS
1.0000 | ORAL_TABLET | Freq: Two times a day (BID) | ORAL | 0 refills | Status: DC
Start: 2021-12-30 — End: 2022-02-09
  Filled 2021-12-30: qty 20, 10d supply, fill #0

## 2021-12-30 NOTE — Patient Instructions (Addendum)
  We will cover you for Strep Throat with antibiotics, take Amoxicillin 541m twice a day for 10 days. We will send swab for a culture, and notify you next week if we need to change antibiotics.  - Take regular NSAID with either Aleve 1-2 pills twice a day OR Ibuprofen 400 to 6046mper dose with food every 6-8 hours or 3 times a day for next 3 to 5 days regularly, then as needed only - Take Tylenol [phone] dose as needed every 8 hours or 3 times a day between for pain, fevers, or chills - Drink extra clear fluids (water, or G2 gatorade), try colder soft foods if needed otherwise regular diet - Drink warm herbal tea with honey for sore throat   There is a chance that this is more of a Viral Pharyngitis, in which case it will take time to run it's course regardless, and may have some hoarseness or throat pain lasting for up to 7-10 days then improve  Please schedule a Follow-up Appointment to: Return if symptoms worsen or fail to improve.  If you have any other questions or concerns, please feel free to call the office or send a message through [facility] may also schedule an earlier appointment if necessary.  Additionally, you may be receiving a survey about your experience at our office within a few days to 1 week by e-mail or mail. We value your feedback.  AleNobie PutnamO [place]

## 2021-12-30 NOTE — Progress Notes (Signed)
Subjective:    Patient ID: Linda Vega, female    DOB: [birth year], 46 y.o.   MRN: [id number]  Linda Vega is a 46 y.o. female presenting on 12/30/2021 for Sore Throat   HPI  Sore Throat Tonsillith Recent symptoms onset few days with sore throat fullness, both ears with aching. Prior history of strep throat. Denies fevers chills, cough dyspnea drainage or congestion      02/01/2021    4:37 PM 01/14/2020    2:26 PM 09/12/2019    2:24 PM  Depression screen PHQ 2/9  Decreased Interest 0 0 0  Down, Depressed, Hopeless 0 0 0  PHQ - 2 Score 0 0 0  Altered sleeping   0  Tired, decreased energy   0  Change in appetite   0  Feeling bad or failure about yourself    0  Trouble concentrating   1  Moving slowly or fidgety/restless   0  Suicidal thoughts   0  PHQ-9 Score   1  Difficult doing work/chores   Very difficult    Social History   Tobacco Use   Smoking status: Never   Smokeless tobacco: Never  Vaping Use   Vaping Use: Never used  Substance Use Topics   Alcohol use: Yes    Comment: occasional, about 3x a month   Drug use: No    Review of Systems Per HPI unless specifically indicated above     Objective:    BP 128/84   Pulse (!) 103   Ht 5' 6"  (1.676 m)   Wt 234 lb 9.6 oz (106.4 kg)   LMP 10/04/2019 (Exact Date)   SpO2 99%   BMI 37.87 kg/m   Wt Readings from Last 3 Encounters:  12/30/21 234 lb 9.6 oz (106.4 kg)  11/11/21 242 lb 6.4 oz (110 kg)  06/16/21 238 lb (108 kg)    Physical Exam Vitals and nursing note reviewed.  Constitutional:      General: She is not in acute distress.    Appearance: She is well-developed. She is not diaphoretic.     Comments: Well-appearing, comfortable, cooperative  HENT:     Head: Normocephalic and atraumatic.     Right Ear: Ear canal normal. No drainage or swelling.     Left Ear: Ear canal normal. No drainage or swelling.     Ears:     Comments: R ear some pressure no effusion    Mouth/Throat:     Mouth: No  oral lesions.     Pharynx: Pharyngeal swelling, oropharyngeal exudate and posterior oropharyngeal erythema present.  Eyes:     General:        Right eye: No discharge.        Left eye: No discharge.     Conjunctiva/sclera: Conjunctivae normal.  Neck:     Thyroid: No thyromegaly.  Cardiovascular:     Rate and Rhythm: Normal rate and regular rhythm.     Heart sounds: Normal heart sounds. No murmur heard. Pulmonary:     Effort: Pulmonary effort is normal. No respiratory distress.     Breath sounds: Normal breath sounds. No wheezing or rales.  Musculoskeletal:        General: Normal range of motion.     Cervical back: Normal range of motion and neck supple.  Lymphadenopathy:     Cervical: No cervical adenopathy.  Skin:    General: Skin is warm and dry.     Findings: No erythema or rash.  Neurological:     Mental Status: She is alert and oriented to person, place, and time.  Psychiatric:        Behavior: Behavior normal.     Comments: Well groomed, good eye contact, normal speech and thoughts   Results for orders placed or performed in visit on 12/30/21  POCT rapid strep A  Result Value Ref Range   Rapid Strep A Screen Negative Negative      Assessment & Plan:   Problem List Items Addressed This Visit   None Visit Diagnoses     Strep throat    -  Primary   Relevant Medications   amoxicillin-clavulanate (AUGMENTIN) 875-125 MG tablet   Other Relevant Orders   POCT rapid strep A (Completed)   Tonsillitis       Relevant Medications   amoxicillin-clavulanate (AUGMENTIN) 875-125 MG tablet   Tonsillith            Consistent with acute pharyngitis, concern for possible strep given history acute pharyngitis without viral prodrome, lack of cough, tender anterior cervical LAD, appearance of posterior pharynx. No known strep or sick contacts. Possible viral pharyngitis. No other localized infection, TMs clear. Able to tolerate PO.   Plan: 1. Rapid strep negative today - still  have high suspicion for strep given very abnormal tonsils 3. Start Augmentin broader coverage BID 10 day 4. Symptomatic control with NSAID / Tylenol regularly then PRN 5. Improve hydration, advance diet, warm herbal tea with honey 6. Follow-up within 1 week if worsening  Tonsilliths may use OTC peroxyl    Meds ordered this encounter  Medications   amoxicillin-clavulanate (AUGMENTIN) 875-125 MG tablet    Sig: Take 1 tablet by mouth 2 (two) times daily.    Dispense:  20 tablet    Refill:  0     Follow up plan: Return if symptoms worsen or fail to improve.   [name], [facility] 12/30/2021, 2:44 PM

## 2022-01-24 ENCOUNTER — Other Ambulatory Visit: Payer: Self-pay | Admitting: Family Medicine

## 2022-01-24 DIAGNOSIS — Z1231 Encounter for screening mammogram for malignant neoplasm of breast: Secondary | ICD-10-CM

## 2022-02-01 ENCOUNTER — Encounter: Payer: Self-pay | Admitting: Family Medicine

## 2022-02-06 ENCOUNTER — Encounter (INDEPENDENT_AMBULATORY_CARE_PROVIDER_SITE_OTHER): Payer: No Typology Code available for payment source | Admitting: Family Medicine

## 2022-02-06 DIAGNOSIS — G8929 Other chronic pain: Secondary | ICD-10-CM

## 2022-02-06 DIAGNOSIS — M79671 Pain in right foot: Secondary | ICD-10-CM

## 2022-02-06 NOTE — Telephone Encounter (Signed)

## 2022-02-08 ENCOUNTER — Other Ambulatory Visit: Payer: Self-pay | Admitting: Family Medicine

## 2022-02-08 ENCOUNTER — Other Ambulatory Visit (HOSPITAL_COMMUNITY): Payer: Self-pay

## 2022-02-08 DIAGNOSIS — I10 Essential (primary) hypertension: Secondary | ICD-10-CM

## 2022-02-08 MED ORDER — AMLODIPINE BESYLATE 10 MG PO TABS
10.0000 mg | ORAL_TABLET | Freq: Every day | ORAL | 1 refills | Status: DC
  Filled 2022-02-08: qty 90, 90d supply, fill #0
  Filled 2022-05-05: qty 90, 90d supply, fill #1

## 2022-02-08 NOTE — Telephone Encounter (Signed)
Requested Prescriptions  Pending Prescriptions Disp Refills  . amLODipine (NORVASC) 10 MG tablet 90 tablet 3    Sig: TAKE 1 TABLET BY MOUTH DAILY.     Cardiovascular: Calcium Channel Blockers 2 Passed - 02/08/2022  9:52 AM      Passed - Last BP in normal range    BP Readings from Last 1 Encounters:  12/30/21 128/84         Passed - Last Heart Rate in normal range    Pulse Readings from Last 1 Encounters:  12/30/21 (!) 103         Passed - Valid encounter within last 6 months    Recent Outpatient Visits          1 month ago Strep throat   [place], DO   2 months ago Urinary frequency   [place], DO   7 months ago Viral sinusitis   [facility] [place], Coralie Keens, NP   1 year ago Annual physical exam   [facility] Olin Hauser, DO   1 year ago Suspected COVID-19 virus infection   [place], Devonne Doughty, DO      Future Appointments            Tomorrow [place], Devonne Doughty, [facility], [facility]

## 2022-02-09 ENCOUNTER — Ambulatory Visit (INDEPENDENT_AMBULATORY_CARE_PROVIDER_SITE_OTHER): Payer: No Typology Code available for payment source | Admitting: Family Medicine

## 2022-02-09 ENCOUNTER — Encounter: Payer: Self-pay | Admitting: Family Medicine

## 2022-02-09 VITALS — BP 138/92 | HR 90 | Ht 66.0 in | Wt 240.8 lb

## 2022-02-09 DIAGNOSIS — Z0000 Encounter for general adult medical examination without abnormal findings: Secondary | ICD-10-CM

## 2022-02-09 DIAGNOSIS — D508 Other iron deficiency anemias: Secondary | ICD-10-CM

## 2022-02-09 DIAGNOSIS — E559 Vitamin D deficiency, unspecified: Secondary | ICD-10-CM

## 2022-02-09 DIAGNOSIS — R7309 Other abnormal glucose: Secondary | ICD-10-CM | POA: Diagnosis not present

## 2022-02-09 DIAGNOSIS — E669 Obesity, unspecified: Secondary | ICD-10-CM | POA: Diagnosis not present

## 2022-02-09 DIAGNOSIS — E7800 Pure hypercholesterolemia, unspecified: Secondary | ICD-10-CM | POA: Insufficient documentation

## 2022-02-09 DIAGNOSIS — I10 Essential (primary) hypertension: Secondary | ICD-10-CM

## 2022-02-09 NOTE — Patient Instructions (Addendum)
Thank you for coming to the office today.  BP still mild elevated. Check regularly at home, if readings consistently >140/90, we can pursue a 2nd BP med Goal for lifestyle intervention and weight management to improve Keep on current medication If palpitations are worse or new concerns for heart let me know we can refer / test Future consider the Coronary CT Calcium Score Limit caffeine if possible  Labs today  Please schedule a Follow-up Appointment to: Return in about 6 months (around 08/12/2022) for 6 month follow-up HTN.  If you have any other questions or concerns, please feel free to call the office or send a message through [place]. You may also schedule an earlier appointment if necessary.  Additionally, you may be receiving a survey about your experience at our office within a few days to 1 week by e-mail or mail. We value your feedback.  [name], DO [place]

## 2022-02-09 NOTE — Progress Notes (Signed)
Subjective:    Patient ID: Linda Vega, female    DOB: 09-11-1975, 46 y.o.   MRN: [id number]  Linda Vega is a 46 y.o. female presenting on 02/09/2022 for Annual Exam   HPI  Here for Annual Physical and Due fasting labs.  Chronic Back Pain Lumbar DDD Chronic problem now for several years, prior imaging Lumbar X-ray 2020, has had CT Abdomen that showed degenerative disc and spinal stenosis lumbar L5-S1 see below Describes some radiating pain from low back, has had hip worked up with negative x-rays in past. Describes variety of other assoc symptoms see above with bladder, and lower extremity radiating pain.  Foot Pain, referral to Podiatry Awaiting apt, no longer walking for exercise, more sedentary  Wellness / Lifestyle Exercise - will resume exercise when foot is improved Diet - improved, goal to be more consistent   Anxiety Mild persistent problem, worse in AM No longer on anxiety med Lexapro   CHRONIC HTN: Checking BP at home overall improved results Current Meds - Amlodipine 32m - Previously on Propranolol for migraine prophylaxis has been off of this for while now Reports good compliance, took meds today. Tolerating well, w/o complaints.   Pre-Diabetes Reports no concerns Prior result A1c 5.8, stable from 1 year ago. Due today for labs Meds: none Lifestyle: - Diet (goal to improve)  Denies hypoglycemia, polyuria, visual changes, numbness or tingling.   Vitamin D Deficiency   History of low MCV / Anemia Reports prior history of heavier periods, had IUD then this caused other menstrual irregularity, she has had history of lower Hgb in 11 in past. Recent lab CBC showed Hgb 12 range, still low MCV. Asymptomatic. Bleeding seems stable with periods now.   HYPERLIPIDEMIA / Obesity BMI >38 Prior mild elevated LDL. Due for lipids and fasting blood work. Not on medicine Lifestyle: - Diet: goal for low salt diet, inc water intake, inc fruits / veggies -  Exercise: plans to increase regular exercise now, cardio exercise     Health Maintenance:   Breast CA Screening: Last mammogram 12/08/20 - negative birads 1 repeat 1 year. Prior dx Fibrocystic disease and dense tissue, no other abnormal. Maternal aunts x 2, age 48-50s treated, maternal grandmother breast cancer. Currently asymptomatic.  next scheduled for 02/15/22   Cervical CA Screening - Last Pap smear done 10/16/16 per [facility] PCP, negative for abnormal or malignant cells and negative HPV co testing - S/p Hysterectomy. [facility] now no longer needs pap smears due to hysterectomy.  Colonoscopy Last done 01/17/21, negative, good for 10 years, repeat 2023  Past Surgical History:  Procedure Laterality Date   ABDOMINAL HYSTERECTOMY     BREAST BIOPSY Right 11/23/2017   [place] breast biopsy of 2:00 mass, coil marker, cellular fibroadenoma   BREAST BIOPSY Left 07/05/2020   [place], 3:00, Heart Clip   RF tag [id number].   BREAST BIOPSY Left 07/05/2020   [place], 5:00, X Clip  RF tag [id number]   BREAST CYST ASPIRATION Left    neg   BREAST EXCISIONAL BIOPSY Left 07/29/2020   BENIGN FIBROADENOMA   BREAST EXCISIONAL BIOPSY Left 07/29/2020   COMPLEX FIBROADENOMA   BREAST LUMPECTOMY WITH RADIOFREQUENCY TAG IDENTIFICATION Left 163/89/3734  Procedure: BREAST LUMPECTOMY WITH RADIOFREQUENCY TAG IDENTIFICATION, lumpectomy x 2;  Surgeon: POlean Ree MD;  Location: [facility];  Service: General;  Laterality: Left;   CESAREAN SECTION     COLONOSCOPY WITH PROPOFOL N/A 01/17/2021  Procedure: COLONOSCOPY WITH PROPOFOL;  Surgeon: Lucilla Lame, MD;  Location: [place];  Service: Endoscopy;  Laterality: N/A;   CYSTOSCOPY N/A 10/14/2019   Procedure: CYSTOSCOPY;  Surgeon: Homero Fellers, MD;  Location: [facility];  Service: Gynecology;  Laterality: N/A;   fibrocystic breast     IUD REMOVAL N/A 10/14/2019   Procedure: INTRAUTERINE DEVICE (IUD) REMOVAL;   Surgeon: Homero Fellers, MD;  Location: [facility];  Service: Gynecology;  Laterality: N/A;   LAPAROSCOPIC APPENDECTOMY N/A 02/17/2021   Procedure: APPENDECTOMY LAPAROSCOPIC PARTIAL CECECTOMY;  Surgeon: Jules Husbands, MD;  Location: [facility];  Service: General;  Laterality: N/A;   LAPAROSCOPY FOR ECTOPIC PREGNANCY     TOTAL LAPAROSCOPIC HYSTERECTOMY WITH SALPINGECTOMY Right 10/14/2019   Procedure: TOTAL LAPAROSCOPIC HYSTERECTOMY WITH RIGHT SALPINGECTOMY;  Surgeon: Homero Fellers, MD;  Location: [facility];  Service: Gynecology;  Laterality: Right;        02/09/2022    1:08 PM 12/30/2021    3:31 PM 02/01/2021    4:37 PM  Depression screen PHQ 2/9  Decreased Interest 0 0 0  Down, Depressed, Hopeless 0  0  PHQ - 2 Score 0 0 0  Altered sleeping 0 0   Tired, decreased energy 0 0   Change in appetite 0 0   Feeling bad or failure about yourself  0 0   Trouble concentrating 0 1   Moving slowly or fidgety/restless 0 0   Suicidal thoughts 0 0   PHQ-9 Score 0 1   Difficult doing work/chores Not difficult at all Not difficult at all       02/09/2022    1:09 PM 12/30/2021    3:31 PM 01/14/2020    2:26 PM 09/12/2019    2:26 PM  GAD 7 : Generalized Anxiety Score  Nervous, Anxious, on Edge 0 0 0 2  Control/stop worrying 0 0 0 2  Worry too much - different things _0 Trouble relaxing 0  0 0  Restless  0 0 0  Easily annoyed or irritable _1 Afraid - awful might happen 0 0 0 1  Total GAD 7 Score   2 9  Anxiety Difficulty Not difficult at all Not difficult at all Not difficult at all Somewhat difficult      Past Medical History:  Diagnosis Date   Anxiety    Arthritis    DDD   BRCA negative 12/2019   MyRisk neg except AXIN2 VUS   Carpal tunnel syndrome right   Family history of breast cancer 12/2019   IBIS=16.7%   GERD (gastroesophageal reflux disease)    Headache    h/o migraines   Hypertension    Past Surgical History:  Procedure Laterality Date   ABDOMINAL  HYSTERECTOMY     BREAST BIOPSY Right 11/23/2017   US guided breast biopsy of 2:00 mass, coil marker, cellular fibroadenoma   BREAST BIOPSY Left 07/05/2020   [place] Bx, 3:00, Heart Clip   RF tag [id number]-FIBROADENOMA.   BREAST BIOPSY Left 07/05/2020   [place] Bx, 5:00, X Clip  RF tag [id number]   BREAST CYST ASPIRATION Left    neg   BREAST EXCISIONAL BIOPSY Left 07/29/2020   BENIGN FIBROADENOMA   BREAST EXCISIONAL BIOPSY Left 07/29/2020   COMPLEX FIBROADENOMA   BREAST LUMPECTOMY WITH RADIOFREQUENCY TAG IDENTIFICATION Left 61/60/7371   Procedure: BREAST LUMPECTOMY WITH RADIOFREQUENCY TAG IDENTIFICATION, lumpectomy x 2;  Surgeon: Olean Ree, MD;  Location: [facility];  Service: General;  Laterality: Left;   CESAREAN SECTION     COLONOSCOPY WITH PROPOFOL N/A 01/17/2021   Procedure: COLONOSCOPY WITH PROPOFOL;  Surgeon: Lucilla Lame, MD;  Location: [place];  Service: Endoscopy;  Laterality: N/A;   CYSTOSCOPY N/A 10/14/2019   Procedure: CYSTOSCOPY;  Surgeon: Homero Fellers, MD;  Location: [facility];  Service: Gynecology;  Laterality: N/A;   fibrocystic breast     IUD REMOVAL N/A 10/14/2019   Procedure: INTRAUTERINE DEVICE (IUD) REMOVAL;  Surgeon: Homero Fellers, MD;  Location: [facility];  Service: Gynecology;  Laterality: N/A;   LAPAROSCOPIC APPENDECTOMY N/A 02/17/2021   Procedure: APPENDECTOMY LAPAROSCOPIC PARTIAL CECECTOMY;  Surgeon: Jules Husbands, MD;  Location: [facility];  Service: General;  Laterality: N/A;   LAPAROSCOPY FOR ECTOPIC PREGNANCY     TOTAL LAPAROSCOPIC HYSTERECTOMY WITH SALPINGECTOMY Right 10/14/2019   Procedure: TOTAL LAPAROSCOPIC HYSTERECTOMY WITH RIGHT SALPINGECTOMY;  Surgeon: Homero Fellers, MD;  Location: [facility];  Service: Gynecology;  Laterality: Right;   Social History   Socioeconomic History   Marital status: Single    Spouse name: Not on file   Number of children: Not on file   Years of education: Not on file   Highest education level: Not  on file  Occupational History   Occupation: Office Rep/Admin    Comment: [employer]  Tobacco Use   Smoking status: Never   Smokeless tobacco: Never  Vaping Use   Vaping Use: Never used  Substance and Sexual Activity   Alcohol use: Yes    Comment: occasional, about 3x a month   Drug use: No   Sexual activity: Not Currently    Birth control/protection: None  Other Topics Concern   Not on file  Social History Narrative   ** Merged History Encounter **       Social Determinants of Health   Financial Resource Strain: Not on file  Food Insecurity: Not on file  Transportation Needs: Not on file  Physical Activity: Not on file  Stress: Not on file  Social Connections: Not on file  Intimate Partner Violence: Not on file   Family History  Problem Relation Age of Onset   Hypertension Mother    Breast cancer Maternal Aunt        66s   Breast cancer Maternal Grandmother 20   Breast cancer Maternal Aunt        75s   Current Outpatient Medications on File Prior to Visit  Medication Sig   amLODipine (NORVASC) 10 MG tablet TAKE 1 TABLET BY MOUTH DAILY.   baclofen (LIORESAL) 10 MG tablet Take 0.5-1 tablets (5-10 mg total) by mouth 3 (three) times daily as needed for muscle spasms.   cetirizine (ZYRTEC) 10 MG tablet Take 1 tablet (10 mg total) by mouth daily.   fluticasone (FLONASE) 50 MCG/ACT nasal spray Place 2 sprays into both nostrils daily. Use for 4-6 weeks then stop and use seasonally or as needed.   traMADol (ULTRAM-ER) 100 MG 24 hr tablet Take 100 mg by mouth daily.   valACYclovir (VALTREX) 1000 MG tablet TAKE 1/2 TABLET BY MOUTH 2 TIMES A DAY FOR 3 TO 5 DAYS AS NEEDED FOR FLARE   Biotin w/ Vitamins C & E (HAIR/SKIN/NAILS PO) Take 1 tablet by mouth daily. (Patient not taking: Reported on 06/16/2021)   No current facility-administered medications on file prior to visit.    Review of Systems  Constitutional:  Negative for activity change, appetite change, chills,  diaphoresis, fatigue and fever.  HENT:  Negative for congestion and hearing loss.   Eyes:  Negative for visual disturbance.  Respiratory:  Negative for cough, chest tightness, shortness of breath and wheezing.   Cardiovascular:  Negative for chest pain, palpitations and leg swelling.  Gastrointestinal:  Negative for abdominal pain, constipation, diarrhea, nausea and vomiting.  Genitourinary:  Negative for dysuria, frequency and hematuria.  Musculoskeletal:  Negative for arthralgias and neck pain.  Skin:  Negative for rash.  Neurological:  Negative for dizziness, weakness, light-headedness, numbness and headaches.  Hematological:  Negative for adenopathy.  Psychiatric/Behavioral:  Negative for behavioral problems, dysphoric mood and sleep disturbance.    Per HPI unless specifically indicated above      Objective:    BP (!) 138/92 (BP Location: Left Arm, Cuff Size: Normal)   Pulse 90   Ht _0  (1.676 m)   Wt 240 lb 12.8 oz (109.2 kg)   LMP 10/04/2019 (Exact Date)   SpO2 98%   BMI 38.87 kg/m   Wt Readings from Last 3 Encounters:  02/09/22 240 lb 12.8 oz (109.2 kg)  12/30/21 234 lb 9.6 oz (106.4 kg)  11/11/21 242 lb 6.4 oz (110 kg)    Physical Exam Vitals and nursing note reviewed.  Constitutional:      General: She is not in acute distress.    Appearance: She is well-developed. She is not diaphoretic.     Comments: Well-appearing, comfortable, cooperative  HENT:     Head: Normocephalic and atraumatic.  Eyes:     General:        Right eye: No discharge.        Left eye: No discharge.     Conjunctiva/sclera: Conjunctivae normal.     Pupils: Pupils are equal, round, and reactive to light.  Neck:     Thyroid: No thyromegaly.     Vascular: No carotid bruit.  Cardiovascular:     Rate and Rhythm: Normal rate and regular rhythm.     Pulses: Normal pulses.     Heart sounds: Normal heart sounds. No murmur heard. Pulmonary:     Effort: Pulmonary effort is normal. No  respiratory distress.     Breath sounds: Normal breath sounds. No wheezing or rales.  Abdominal:     General: Bowel sounds are normal. There is no distension.     Palpations: Abdomen is soft. There is no mass.     Tenderness: There is no abdominal tenderness.  Musculoskeletal:        General: No tenderness. Normal range of motion.     Cervical back: Normal range of motion and neck supple.     Right lower leg: No edema.     Left lower leg: No edema.     Comments: Upper / Lower Extremities: - Normal muscle tone, strength bilateral upper extremities 5/5, lower extremities 5/5  Lymphadenopathy:     Cervical: No cervical adenopathy.  Skin:    General: Skin is warm and dry.     Findings: No erythema or rash.  Neurological:     Mental Status: She is alert and oriented to person, place, and time.     Comments: Distal sensation intact to light touch all extremities  Psychiatric:        Mood and Affect: Mood normal.        Behavior: Behavior normal.        Thought Content: Thought content normal.     Comments: Well groomed, good eye contact, normal speech and thoughts  Results for orders placed or performed in visit on 02/09/22  CBC with Differential/Platelet  Result Value Ref Range   WBC 5.6 3.8 - 10.8 Thousand/uL   RBC 5.57 (H) 3.80 - 5.10 Million/uL   Hemoglobin 12.7 11.7 - 15.5 g/dL   HCT 40.9 35.0 - 45.0 %   MCV 73.4 (L) 80.0 - 100.0 fL   MCH 22.8 (L) 27.0 - 33.0 pg   MCHC 31.1 (L) 32.0 - 36.0 g/dL   RDW 17.7 (H) 11.0 - 15.0 %   Platelets 306 140 - 400 Thousand/uL   MPV 11.1 7.5 - 12.5 fL   Neutro Abs 3,058 1,500 - 7,800 cells/uL   Lymphs Abs 1,747 850 - 3,900 cells/uL   Absolute Monocytes 678 200 - 950 cells/uL   Eosinophils Absolute 50 15 - 500 cells/uL   Basophils Absolute 67 0 - 200 cells/uL   Neutrophils Relative % 54.6 %   Total Lymphocyte 31.2 %   Monocytes Relative 12.1 %   Eosinophils Relative 0.9 %   Basophils Relative 1.2 %      Assessment & Plan:    Problem List Items Addressed This Visit     Vitamin D deficiency   Relevant Orders   VITAMIN D 25 Hydroxy (Vit-D Deficiency, Fractures)   Pure hypercholesterolemia   Relevant Orders   COMPLETE METABOLIC PANEL WITH GFR   Lipid panel   TSH   Obesity (BMI 35.0-39.9 without comorbidity)   Relevant Orders   Lipid panel   Iron deficiency anemia secondary to inadequate dietary iron intake   Relevant Orders   CBC with Differential/Platelet (Completed)   Essential hypertension   Relevant Orders   COMPLETE METABOLIC PANEL WITH GFR   CBC with Differential/Platelet (Completed)   Elevated hemoglobin A1c   Relevant Orders   Hemoglobin A1c   Other Visit Diagnoses     Annual physical exam    -  Primary   Relevant Orders   COMPLETE METABOLIC PANEL WITH GFR   CBC with Differential/Platelet (Completed)   Lipid panel   Hemoglobin A1c   TSH      Updated Health Maintenance information Fasting lab today, pending review Encouraged improvement to lifestyle with diet and exercise Goal of weight loss  HTN BP remains elevated, improved manual repeat Check regularly at home, if readings consistently >140/90, we can pursue a 2nd BP med ARB vs HCTZ Goal for lifestyle intervention and weight management to improve Keep on current medication - today no change.   Consider future Zio patch for heart palpitations  Future consider the Coronary CT Calcium Score  Limit caffeine if possible   Orders Placed This Encounter  Procedures   COMPLETE METABOLIC PANEL WITH GFR   CBC with Differential/Platelet   Lipid panel    Order Specific Question:   Has the patient fasted?    Answer:   Yes   Hemoglobin A1c   TSH   VITAMIN D 25 Hydroxy (Vit-D Deficiency, Fractures)     No orders of the defined types were placed in this encounter.     Follow up plan: Return in about 6 months (around 08/12/2022) for 6 month follow-up HTN.  [name], [facility] 02/09/2022, 8:10 AM

## 2022-02-10 LAB — COMPLETE METABOLIC PANEL WITH GFR
AG Ratio: 1.4 (calc) (ref 1.0–2.5)
ALT: 16 U/L (ref 6–29)
AST: 16 U/L (ref 10–35)
Albumin: 4.1 g/dL (ref 3.6–5.1)
Alkaline phosphatase (APISO): 64 U/L (ref 31–125)
BUN: 9 mg/dL (ref 7–25)
CO2: 28 mmol/L (ref 20–32)
Calcium: 9.3 mg/dL (ref 8.6–10.2)
Chloride: 103 mmol/L (ref 98–110)
Creat: 0.79 mg/dL (ref 0.50–0.99)
Globulin: 2.9 g/dL (calc) (ref 1.9–3.7)
Glucose, Bld: 91 mg/dL (ref 65–99)
Potassium: 4.4 mmol/L (ref 3.5–5.3)
Sodium: 137 mmol/L (ref 135–146)
Total Bilirubin: 0.5 mg/dL (ref 0.2–1.2)
Total Protein: 7 g/dL (ref 6.1–8.1)
eGFR: 92 mL/min/{1.73_m2} (ref >=60)

## 2022-02-10 LAB — CBC WITH DIFFERENTIAL/PLATELET
Absolute Monocytes: 678 cells/uL (ref 200–950)
Basophils Absolute: 67 cells/uL (ref 0–200)
Basophils Relative: 1.2 %
Eosinophils Absolute: 50 cells/uL (ref 15–500)
Eosinophils Relative: 0.9 %
HCT: 40.9 % (ref 35.0–45.0)
Hemoglobin: 12.7 g/dL (ref 11.7–15.5)
Lymphs Abs: 1747 cells/uL (ref 850–3900)
MCH: 22.8 pg — ABNORMAL LOW (ref 27.0–33.0)
MCHC: 31.1 g/dL — ABNORMAL LOW (ref 32.0–36.0)
MCV: 73.4 fL — ABNORMAL LOW (ref 80.0–100.0)
MPV: 11.1 fL (ref 7.5–12.5)
Monocytes Relative: 12.1 %
Neutro Abs: 3058 cells/uL (ref 1500–7800)
Neutrophils Relative %: 54.6 %
Platelets: 306 10*3/uL (ref 140–400)
RBC: 5.57 10*6/uL — ABNORMAL HIGH (ref 3.80–5.10)
RDW: 17.7 % — ABNORMAL HIGH (ref 11.0–15.0)
Total Lymphocyte: 31.2 %
WBC: 5.6 10*3/uL (ref 3.8–10.8)

## 2022-02-10 LAB — LIPID PANEL
Cholesterol: 179 mg/dL (ref <200)
HDL: 50 mg/dL (ref >=50)
LDL Cholesterol (Calc): 113 mg/dL (calc) — ABNORMAL HIGH
Non-HDL Cholesterol (Calc): 129 mg/dL (calc) (ref <130)
Total CHOL/HDL Ratio: 3.6 (calc) (ref <5.0)
Triglycerides: 70 mg/dL (ref <150)

## 2022-02-10 LAB — VITAMIN D 25 HYDROXY (VIT D DEFICIENCY, FRACTURES): Vit D, 25-Hydroxy: 23 ng/mL — ABNORMAL LOW (ref 30–100)

## 2022-02-10 LAB — HEMOGLOBIN A1C
Hgb A1c MFr Bld: 5.7 % of total Hgb — ABNORMAL HIGH (ref <5.7)
Mean Plasma Glucose: 117 mg/dL
eAG (mmol/L): 6.5 mmol/L

## 2022-02-10 LAB — TSH: TSH: 1.79 mIU/L

## 2022-02-13 ENCOUNTER — Other Ambulatory Visit: Payer: Self-pay

## 2022-02-13 ENCOUNTER — Encounter: Payer: Self-pay | Admitting: Family Medicine

## 2022-02-13 ENCOUNTER — Ambulatory Visit (INDEPENDENT_AMBULATORY_CARE_PROVIDER_SITE_OTHER): Payer: No Typology Code available for payment source | Admitting: Family Medicine

## 2022-02-13 VITALS — BP 136/82 | HR 106 | Ht 67.0 in | Wt 240.0 lb

## 2022-02-13 DIAGNOSIS — M778 Other enthesopathies, not elsewhere classified: Secondary | ICD-10-CM | POA: Diagnosis not present

## 2022-02-13 MED ORDER — CELECOXIB 100 MG PO CAPS
100.0000 mg | ORAL_CAPSULE | Freq: Two times a day (BID) | ORAL | 0 refills | Status: DC
  Filled 2022-02-13: qty 60, 30d supply, fill #0

## 2022-02-13 NOTE — Assessment & Plan Note (Signed)
Right-hand-dominant patient presenting with 2-day history of atraumatic right lateral shoulder pain after sleeping with her arm fully overhead.  Has had severe pain to lateral shoulder without radiation proximally or distally, no paresthesias, aggravated by abduction and ADLs (getting dressed).  Denies any similar symptoms in the past, is dealing with ipsilateral wrist issue.  Examination shows full painful range of motion with maximal symptoms reproduced during abduction and extension/internal rotation, rotator cuff testing reveals 5/5 external rotation painless, 5/5 isolated supraspinatus testing, minimally painful, 5 -/5 internal rotation that recreates her symptomatology, equivocal impingement testing, nontender throughout, negative speeds, negative Yergason's, negative O'Brien's.  Her clinical history and findings are most consistent with rotator cuff tendinitis with focality to the subscapularis, have advised a 1-2-week course of Celebrex, home exercises to begin this weekend and continue, out of work x1 day restrictions, and low threshold to advance to subacromial corticosteroid injection due to comorbid GERD.  Status update at 4 weeks if symptoms fail to improve, at that time additional interventions (PT, imaging) to be considered.

## 2022-02-13 NOTE — Patient Instructions (Signed)
-   Remain out of work today, can return tomorrow - Start Celebrex every a.m. and every p.m. scheduled x1 week (take with food) - Have a low threshold to continue Celebrex into week 2 - Week 3 onwards can dose twice daily on an as-needed basis - Start home exercises this weekend with information provided, focus on slow and steady advance - Can continue baclofen on as-needed basis as well as tramadol (combine with acetaminophen when dosing) - Contact our office for any lingering symptoms at the 4-week mark or questions between now and then

## 2022-02-13 NOTE — Progress Notes (Signed)
     Primary Care / Sports Medicine Office Visit  Patient Information:  Patient ID: Linda Vega, female DOB: 1976/03/06 Age: 46 y.o. MRN: [id number]   Linda Vega is a pleasant 46 y.o. female presenting with the following:  Chief Complaint  Patient presents with   Shoulder Pain    Right shoulder, was laying on it yesterday morning with it above her head, felt a pain on outside of shoulder, has got worst since.     Vitals:   02/13/22 0822  BP: 136/82  Pulse: (!) 106  SpO2: 96%   Vitals:   02/13/22 0822  Weight: 240 lb (108.9 kg)  Height: '5\' 7"'$  (1.702 m)   Body mass index is 37.59 kg/m.  No results found.   Independent interpretation of notes and tests performed by another provider:   None  Procedures performed:   None  Pertinent History, Exam, Impression, and Recommendations:   Problem List Items Addressed This Visit       Musculoskeletal and Integument   Subscapularis tendinitis of right shoulder - Primary    Right-hand-dominant patient presenting with 2-day history of atraumatic right lateral shoulder pain after sleeping with her arm fully overhead.  Has had severe pain to lateral shoulder without radiation proximally or distally, no paresthesias, aggravated by abduction and ADLs (getting dressed).  Denies any similar symptoms in the past, is dealing with ipsilateral wrist issue.  Examination shows full painful range of motion with maximal symptoms reproduced during abduction and extension/internal rotation, rotator cuff testing reveals 5/5 external rotation painless, 5/5 isolated supraspinatus testing, minimally painful, 5 -/5 internal rotation that recreates her symptomatology, equivocal impingement testing, nontender throughout, negative speeds, negative Yergason's, negative O'Brien's.  Her clinical history and findings are most consistent with rotator cuff tendinitis with focality to the subscapularis, have advised a 1-2-week course of Celebrex, home  exercises to begin this weekend and continue, out of work x1 day restrictions, and low threshold to advance to subacromial corticosteroid injection due to comorbid GERD.  Status update at 4 weeks if symptoms fail to improve, at that time additional interventions (PT, imaging) to be considered.       Relevant Medications   celecoxib (CELEBREX) 100 MG capsule     Orders & Medications Meds ordered this encounter  Medications   celecoxib (CELEBREX) 100 MG capsule    Sig: Take 1 capsule (100 mg total) by mouth 2 (two) times daily.    Dispense:  60 capsule    Refill:  0   No orders of the defined types were placed in this encounter.    No follow-ups on file.     Montel Culver, MD   Primary Care Sports Medicine [place]

## 2022-02-15 ENCOUNTER — Ambulatory Visit
Admission: RE | Admit: 2022-02-15 | Discharge: 2022-02-15 | Disposition: A | Discharge status: Home / Self Care | Payer: No Typology Code available for payment source | Source: Ambulatory Visit | Attending: Family Medicine | Admitting: Family Medicine

## 2022-02-15 DIAGNOSIS — Z1231 Encounter for screening mammogram for malignant neoplasm of breast: Secondary | ICD-10-CM

## 2022-02-22 ENCOUNTER — Encounter: Payer: Self-pay | Admitting: Family Medicine

## 2022-02-24 ENCOUNTER — Other Ambulatory Visit: Payer: Self-pay

## 2022-02-24 DIAGNOSIS — M722 Plantar fascial fibromatosis: Secondary | ICD-10-CM

## 2022-02-28 ENCOUNTER — Encounter: Payer: Self-pay | Admitting: Family Medicine

## 2022-02-28 DIAGNOSIS — M778 Other enthesopathies, not elsewhere classified: Secondary | ICD-10-CM

## 2022-02-28 DIAGNOSIS — M722 Plantar fascial fibromatosis: Secondary | ICD-10-CM

## 2022-03-01 ENCOUNTER — Other Ambulatory Visit: Payer: Self-pay

## 2022-03-01 MED ORDER — IBUPROFEN 800 MG PO TABS
800.0000 mg | ORAL_TABLET | Freq: Three times a day (TID) | ORAL | 1 refills | Status: AC | PRN
  Filled 2022-03-01: qty 90, 30d supply, fill #0

## 2022-03-01 NOTE — Addendum Note (Signed)
Addended by: Olin Hauser on: 03/01/2022 12:09 PM   Modules accepted: Orders

## 2022-03-02 ENCOUNTER — Other Ambulatory Visit: Payer: Self-pay

## 2022-03-07 ENCOUNTER — Ambulatory Visit: Payer: No Typology Code available for payment source | Attending: Family Medicine | Admitting: Physical Therapy

## 2022-03-07 DIAGNOSIS — M722 Plantar fascial fibromatosis: Secondary | ICD-10-CM | POA: Diagnosis not present

## 2022-03-07 DIAGNOSIS — M79671 Pain in right foot: Secondary | ICD-10-CM | POA: Insufficient documentation

## 2022-03-07 DIAGNOSIS — R269 Unspecified abnormalities of gait and mobility: Secondary | ICD-10-CM | POA: Diagnosis present

## 2022-03-08 NOTE — Therapy (Signed)
OUTPATIENT PHYSICAL THERAPY LOWER EXTREMITY EVALUATION   Patient Name: Linda Vega MRN: [id number] DOB:[birth year], 46 y.o., female Today's Date: 03/08/2022   PT End of Session - 03/08/22 1509     Visit Number 1    Number of Visits 9    Date for PT Re-Evaluation 04/04/22    PT Start Time 1718    PT Stop Time 1812    PT Time Calculation (min) 54 min    Activity Tolerance Patient tolerated treatment well             Past Medical History:  Diagnosis Date   Anxiety    Arthritis    DDD   BRCA negative 12/2019   MyRisk neg except AXIN2 VUS   Carpal tunnel syndrome right   Family history of breast cancer 12/2019   IBIS=16.7%   GERD (gastroesophageal reflux disease)    Headache    h/o migraines   Hypertension    Past Surgical History:  Procedure Laterality Date   ABDOMINAL HYSTERECTOMY     BREAST BIOPSY Right 11/23/2017   US guided breast biopsy of 2:00 mass, coil marker, cellular fibroadenoma   BREAST BIOPSY Left 07/05/2020   [place] Bx, 3:00, Heart Clip   RF tag [id number].   BREAST BIOPSY Left 07/05/2020   [place] Bx, 5:00, X Clip  RF tag [id number]   BREAST CYST ASPIRATION Left    neg   BREAST EXCISIONAL BIOPSY Left 07/29/2020   BENIGN FIBROADENOMA   BREAST EXCISIONAL BIOPSY Left 07/29/2020   COMPLEX FIBROADENOMA   BREAST LUMPECTOMY WITH RADIOFREQUENCY TAG IDENTIFICATION Left 57/08/7791   Procedure: BREAST LUMPECTOMY WITH RADIOFREQUENCY TAG IDENTIFICATION, lumpectomy x 2;  Surgeon: Olean Ree, MD;  Location: [facility];  Service: General;  Laterality: Left;   CESAREAN SECTION     COLONOSCOPY WITH PROPOFOL N/A 01/17/2021   Procedure: COLONOSCOPY WITH PROPOFOL;  Surgeon: Lucilla Lame, MD;  Location: [place];  Service: Endoscopy;  Laterality: N/A;   CYSTOSCOPY N/A 10/14/2019   Procedure: CYSTOSCOPY;  Surgeon: Homero Fellers, MD;  Location: [facility];  Service: Gynecology;  Laterality: N/A;   fibrocystic breast     IUD REMOVAL N/A 10/14/2019    Procedure: INTRAUTERINE DEVICE (IUD) REMOVAL;  Surgeon: Homero Fellers, MD;  Location: [facility];  Service: Gynecology;  Laterality: N/A;   LAPAROSCOPIC APPENDECTOMY N/A 02/17/2021   Procedure: APPENDECTOMY LAPAROSCOPIC PARTIAL CECECTOMY;  Surgeon: Jules Husbands, MD;  Location: [facility];  Service: General;  Laterality: N/A;   LAPAROSCOPY FOR ECTOPIC PREGNANCY     TOTAL LAPAROSCOPIC HYSTERECTOMY WITH SALPINGECTOMY Right 10/14/2019   Procedure: TOTAL LAPAROSCOPIC HYSTERECTOMY WITH RIGHT SALPINGECTOMY;  Surgeon: Homero Fellers, MD;  Location: [facility];  Service: Gynecology;  Laterality: Right;   Patient Active Problem List   Diagnosis Date Noted   Subscapularis tendinitis of right shoulder 02/13/2022   Pure hypercholesterolemia 02/09/2022   Elevated hemoglobin A1c 02/01/2021   Gastroesophageal reflux disease without esophagitis 02/01/2021   Encounter for screening colonoscopy    Tingling 11/12/2020   Nerve pain 10/06/2020   Left arm pain 10/06/2020   Carpal tunnel syndrome of right wrist 10/06/2020   Fibroadenoma of breast, left    Abnormal uterine bleeding (AUB) 02/06/2020   Family history of breast cancer 01/20/2020   Osteoarthritis of spine with radiculopathy, lumbar region 07/16/2019   Low back pain 03/11/2018   Obesity (BMI 35.0-39.9 without comorbidity) 09/14/2017   Anemia associated with acute blood loss 08/27/2017   Snoring 08/05/2017   Vitamin D deficiency  08/03/2017   Migraine without aura and without status migrainosus, not intractable 04/12/2017   Iron deficiency anemia secondary to inadequate dietary iron intake 09/03/2016   Essential hypertension 05/29/2015   Anxiety 03/22/2015   Positive PPD 06/04/2012   Genital herpes 02/07/2011    PCP: Olin Hauser, DO  REFERRING PROVIDER: Montel Culver, MD  REFERRING DIAG: M72.2 (ICD-10-CM) - Plantar fasciitis of right foot  THERAPY DIAG:  Pain in right foot  Gait difficulty  Rationale for  Evaluation and Treatment Rehabilitation  ONSET DATE: 01/28/22  SUBJECTIVE:   SUBJECTIVE STATEMENT: Pt. States she likes to walk 3x/week but recently limited due to significant R plantar foot pain.  Pt. Reports 1/10 R mid-arch foot pain at rest and 8/10 pain at worst after prolonged sitting/ walking to bathroom at night.  Pt. States she is trying to lose weight but limited with activity at this time secondary to R foot pain.  Pt. States R foot will get numb with prolonged sitting.  Pt. Works at [employer] and primarily sits for majority of workday.    PERTINENT HISTORY: No significant PMHx for R plantar foot pain.    PAIN:  Are you having pain? Yes: NPRS scale: 1/10 Pain location: R mid-arch of plantar foot Pain description: sharp Aggravating factors: prolonged sitting Relieving factors: rest  PRECAUTIONS: None  WEIGHT BEARING RESTRICTIONS No  FALLS:  Has patient fallen in last 6 months? No  LIVING ENVIRONMENT: Lives with: lives with their family Lives in: House/apartment Has following equipment at home: None  PLOF: Independent  PATIENT GOALS return to walking 3x/week with no pain.  b   OBJECTIVE:   DIAGNOSTIC FINDINGS: N/A  PATIENT SURVEYS:  FOTO initial 15/ goal 83  COGNITION:  Overall cognitive status: Within functional limits for tasks assessed     SENSATION: WFL  EDEMA:  No significant B lower leg/ ankle edema  POSTURE: No Significant postural limitations  PALPATION: Tender along R mid-arch with deep palpation.  No fascia tightness noted in R arch as compared to L.  Slight pes planus bilaterally.    LOWER EXTREMITY ROM:  Active ROM Right eval Left eval  Hip flexion WNL WNL  Hip extension    Hip abduction    Hip adduction    Hip internal rotation    Hip external rotation    Knee flexion WNL WNL  Knee extension WNL WNL  Ankle dorsiflexion WNL WNL  Ankle plantarflexion WNL WNL  Ankle inversion WNL WNL  Ankle eversion WNL WNL   (Blank  rows = not tested)  LOWER EXTREMITY MMT:  MMT Right eval Left eval  Hip flexion 5/5 5/5  Hip extension    Hip abduction 5/5 5/5  Hip adduction 5/5 5/5  Hip internal rotation    Hip external rotation    Knee flexion 5/5 5/5  Knee extension 5/5 5/5  Ankle dorsiflexion 5/5 5/5  Ankle plantarflexion 5/5 5/5  Ankle inversion 5/5 5/5  Ankle eversion 5/5 5/5   (Blank rows = not tested)  GAIT: Distance walked: in clinic  Assistive device utilized: None Level of assistance: Complete Independence Comments: Slight R antalgic gait pattern with initial steps after standing from a prolonged sitting position.    TODAY'S TREATMENT: Evaluation.  Issued HEP  PATIENT EDUCATION:  Education details: Ice bottle massage/ toe crunches/ toe spreading/ great toe extension stretches. Discussed shoe wear and orthotics.  Person educated: Patient Education method: Explanation, Demonstration, and Handouts Education comprehension: verbalized understanding   HOME EXERCISE  PROGRAM: Handouts issued.   ASSESSMENT:  CLINICAL IMPRESSION: Patient is a pleasant 46 y.o. female who was seen today for physical therapy evaluation and treatment for R plantar fascitis.   Pt. Reports 1/10 R plantar foot/ arch pain at rest and >8/10 at worst.  Pt. Is not currently able to participate with walking program due to R foot pain.  Pt. Presents with good R ankle AROM/ strength.  Pt. Will benefit from proper shoe wear/ arch support and education on foot/toe stretches/ massage to manage pain symptoms.     OBJECTIVE IMPAIRMENTS Abnormal gait, decreased activity tolerance, decreased balance, decreased endurance, decreased mobility, difficulty walking, decreased strength, impaired flexibility, and pain.   ACTIVITY LIMITATIONS carrying, lifting, bending, standing, squatting, stairs, transfers, and locomotion level  PARTICIPATION LIMITATIONS: cleaning, laundry, shopping, community activity, occupation, and yard work  PERSONAL  FACTORS Age and Past/current experiences are also affecting patient's functional outcome.   REHAB POTENTIAL: Excellent  CLINICAL DECISION MAKING: Stable/uncomplicated  EVALUATION COMPLEXITY: Low   GOALS: Goals reviewed with patient? Yes  SHORT TERM GOALS: Target date: 03/21/22 Pt. Will report no tenderness in R mid-arch/ plantar aspect of foot to improve return to walking.   Baseline: Goal status: INITIAL   LONG TERM GOALS: Target date: 04/04/22  Pt. Will increase FOTO to 57 to improve pain-free mobility.   Baseline:  03/07/22: 15 Goal status: INITIAL  2.  Pt. Will report no R foot pain when getting out of bed to walk to bathroom at night.  Baseline:  marked increase in pain/ pt. Reports crawling to bathroom Goal status: INITIAL  3.  Pt. Will be able to walk 30 minutes with normalized gait pattern to improve pain-free mobility.   Baseline:  Goal status: INITIAL   PLAN: PT FREQUENCY: 2x/week  PT DURATION: 4 weeks  PLANNED INTERVENTIONS: Therapeutic exercises, Therapeutic activity, Neuromuscular re-education, Balance training, Gait training, Patient/Family education, Self Care, Joint mobilization, Dry Needling, Cryotherapy, Moist heat, and Manual therapy  PLAN FOR NEXT SESSION: reassess shoe wear/ discuss orthotics.   Pura Spice, PT, DPT # [phone] 03/08/2022, 3:11 PM

## 2022-03-09 ENCOUNTER — Ambulatory Visit: Payer: No Typology Code available for payment source | Admitting: Physical Therapy

## 2022-03-14 ENCOUNTER — Ambulatory Visit: Payer: No Typology Code available for payment source | Admitting: Physical Therapy

## 2022-03-16 ENCOUNTER — Ambulatory Visit: Payer: No Typology Code available for payment source | Admitting: Physical Therapy

## 2022-03-21 ENCOUNTER — Encounter: Payer: No Typology Code available for payment source | Admitting: Physical Therapy

## 2022-03-22 ENCOUNTER — Encounter: Payer: Self-pay | Admitting: Family Medicine

## 2022-03-23 ENCOUNTER — Encounter: Payer: No Typology Code available for payment source | Admitting: Physical Therapy

## 2022-03-28 ENCOUNTER — Encounter: Payer: No Typology Code available for payment source | Admitting: Physical Therapy

## 2022-03-30 ENCOUNTER — Encounter: Payer: No Typology Code available for payment source | Admitting: Physical Therapy

## 2022-04-04 ENCOUNTER — Encounter: Payer: No Typology Code available for payment source | Admitting: Physical Therapy

## 2022-04-07 ENCOUNTER — Ambulatory Visit: Payer: No Typology Code available for payment source | Admitting: Physician Assistant

## 2022-05-05 ENCOUNTER — Other Ambulatory Visit (HOSPITAL_COMMUNITY): Payer: Self-pay

## 2022-07-07 ENCOUNTER — Encounter: Payer: Self-pay | Admitting: Family Medicine

## 2022-07-10 ENCOUNTER — Other Ambulatory Visit (HOSPITAL_COMMUNITY)
Admission: RE | Admit: 2022-07-10 | Discharge: 2022-07-10 | Disposition: A | Discharge status: Home / Self Care | Payer: No Typology Code available for payment source | Source: Ambulatory Visit | Attending: Internal Medicine | Admitting: Internal Medicine

## 2022-07-10 ENCOUNTER — Ambulatory Visit (INDEPENDENT_AMBULATORY_CARE_PROVIDER_SITE_OTHER): Payer: No Typology Code available for payment source | Admitting: Internal Medicine

## 2022-07-10 ENCOUNTER — Encounter: Payer: Self-pay | Admitting: Internal Medicine

## 2022-07-10 VITALS — BP 136/84 | HR 99 | Temp 96.8°F | Wt 243.0 lb

## 2022-07-10 DIAGNOSIS — N898 Other specified noninflammatory disorders of vagina: Secondary | ICD-10-CM | POA: Diagnosis not present

## 2022-07-10 DIAGNOSIS — Z113 Encounter for screening for infections with a predominantly sexual mode of transmission: Secondary | ICD-10-CM | POA: Diagnosis not present

## 2022-07-10 DIAGNOSIS — Z202 Contact with and (suspected) exposure to infections with a predominantly sexual mode of transmission: Secondary | ICD-10-CM | POA: Insufficient documentation

## 2022-07-10 MED ORDER — METRONIDAZOLE 500 MG PO TABS: 500.0000 mg | ORAL_TABLET | Freq: Two times a day (BID) | ORAL | 0 refills | Status: DC

## 2022-07-10 NOTE — Progress Notes (Signed)
 Subjective:    Patient ID: Linda Vega, female    DOB: 02/23/1976, 46 y.o.   MRN: [id number]  HPI  Patient presents to the clinic today requesting a Pap smear.  She has had an hysterectomy.  Her last Pap smear was in 2018.  Her biggest concern is that she has been exposed to trichomonas by her partner.  He tested positive last week.  She is having pelvic pressure, vaginal discharge, vaginal odor and itching.  She denies abnormal vaginal bleeding.  She has not tried anything OTC for this.  Review of Systems     Past Medical History:  Diagnosis Date   Anxiety    Arthritis    DDD   BRCA negative 12/2019   MyRisk neg except AXIN2 VUS   Carpal tunnel syndrome right   Family history of breast cancer 12/2019   IBIS=16.7%   GERD (gastroesophageal reflux disease)    Headache    h/o migraines   Hypertension     Current Outpatient Medications  Medication Sig Dispense Refill   amLODipine (NORVASC) 10 MG tablet TAKE 1 TABLET BY MOUTH DAILY. 90 tablet 1   baclofen (LIORESAL) 10 MG tablet Take 0.5-1 tablets (5-10 mg total) by mouth 3 (three) times daily as needed for muscle spasms. 90 each 1   cetirizine (ZYRTEC) 10 MG tablet Take 1 tablet (10 mg total) by mouth daily. 30 tablet 11   fluticasone (FLONASE) 50 MCG/ACT nasal spray Place 2 sprays into both nostrils daily. Use for 4-6 weeks then stop and use seasonally or as needed. 16 g 3   ibuprofen (ADVIL) 800 MG tablet Take 1 tablet (800 mg total) by mouth every 8 (eight) hours as needed. 90 tablet 1   traMADol (ULTRAM-ER) 100 MG 24 hr tablet Take 100 mg by mouth daily.     valACYclovir (VALTREX) 1000 MG tablet TAKE 1/2 TABLET BY MOUTH 2 TIMES A DAY FOR 3 TO 5 DAYS AS NEEDED FOR FLARE 20 tablet 3   No current facility-administered medications for this visit.    Allergies  Allergen Reactions   Shellfish Allergy Hives, Hypertension, Itching, Palpitations, Shortness Of Breath, Swelling and Anaphylaxis   Eggs Or Egg-Derived Products  Hives   Lactose Intolerance (Gi) Diarrhea    Upset stomach, bloating     Family History  Problem Relation Age of Onset   Hypertension Mother    Breast cancer Maternal Aunt        50s   Breast cancer Maternal Grandmother 20   Breast cancer Maternal Aunt        50s    Social History   Socioeconomic History   Marital status: Single    Spouse name: Not on file   Number of children: Not on file   Years of education: Not on file   Highest education level: Not on file  Occupational History   Occupation: Office Rep/Admin    Comment: [employer]  Tobacco Use   Smoking status: Never   Smokeless tobacco: Never  Vaping Use   Vaping Use: Never used  Substance and Sexual Activity   Alcohol use: Yes    Comment: occasional, about 3x a month   Drug use: No   Sexual activity: Not Currently    Birth control/protection: None  Other Topics Concern   Not on file  Social History Narrative   ** Merged History Encounter **       Social Determinants of Health   Financial Resource Strain: Not on   file  Food Insecurity: Not on file  Transportation Needs: Not on file  Physical Activity: Not on file  Stress: Not on file  Social Connections: Not on file  Intimate Partner Violence: Not on file     Constitutional: Denies fever, malaise, fatigue, headache or abrupt weight changes.  Respiratory: Denies difficulty breathing, shortness of breath, cough or sputum production.   Cardiovascular: Denies chest pain, chest tightness, palpitations or swelling in the hands or feet.  Gastrointestinal: Patient reports pelvic pressure.  Denies abdominal pain, bloating, constipation, diarrhea or blood in the stool.  GU: Patient reports vaginal itching, discharge, odor.  Denies urgency, frequency, pain with urination, burning sensation, blood in urine.  No other specific complaints in a complete review of systems (except as listed in HPI above).  Objective:   Physical Exam   BP 136/84 (BP  Location: Left Arm, Patient Position: Sitting, Cuff Size: Normal)   Pulse 99   Temp (!) 96.8 F (36 C) (Temporal)   Wt 243 lb (110.2 kg)   LMP 10/04/2019 (Exact Date)   SpO2 100%   BMI 38.06 kg/m   Wt Readings from Last 3 Encounters:  02/13/22 240 lb (108.9 kg)  02/09/22 240 lb 12.8 oz (109.2 kg)  12/30/21 234 lb 9.6 oz (106.4 kg)    General: Appears her stated age, obese, in NAD. Cardiovascular: Normal rate. Pulmonary/Chest: Normal effort. GU: Self swab.   Neurological: Alert and oriented.   BMET    Component Value Date/Time   NA 137 02/09/2022 0839   K 4.4 02/09/2022 0839   CL 103 02/09/2022 0839   CO2 28 02/09/2022 0839   GLUCOSE 91 02/09/2022 0839   BUN 9 02/09/2022 0839   CREATININE 0.79 02/09/2022 0839   CALCIUM 9.3 02/09/2022 0839   GFRNONAA >60 01/15/2020 0902   GFRAA >60 01/15/2020 0902    Lipid Panel     Component Value Date/Time   CHOL 179 02/09/2022 0839   TRIG 70 02/09/2022 0839   HDL 50 02/09/2022 0839   CHOLHDL 3.6 02/09/2022 0839   VLDL 17 01/15/2020 0902   LDLCALC 113 (H) 02/09/2022 0839    CBC    Component Value Date/Time   WBC 5.6 02/09/2022 0839   RBC 5.57 (H) 02/09/2022 0839   HGB 12.7 02/09/2022 0839   HCT 40.9 02/09/2022 0839   PLT 306 02/09/2022 0839   MCV 73.4 (L) 02/09/2022 0839   MCH 22.8 (L) 02/09/2022 0839   MCHC 31.1 (L) 02/09/2022 0839   RDW 17.7 (H) 02/09/2022 0839   LYMPHSABS 1,747 02/09/2022 0839   MONOABS 0.5 01/15/2020 0902   EOSABS 50 02/09/2022 0839   BASOSABS 67 02/09/2022 0839    Hgb A1C Lab Results  Component Value Date   HGBA1C 5.7 (H) 02/09/2022           Assessment & Plan:   Exposure to Trichomonas, Screen for STD, Vaginal Discharge, Vaginal Odor, Vaginal Itching:  Will check wet prep for gonorrhea, chlamydia, trichomonas, BV and yeast Will check HIV, RPR and hep C Rx for Metronidazole 500 mg twice daily x 7 days  Follow-up with your PCP as previously scheduled  Webb Silversmith, NP

## 2022-07-10 NOTE — Patient Instructions (Signed)
Safe Sex Practicing safe sex means taking steps before and during sex to reduce your risk of: Getting an STI (sexually transmitted infection). Giving your partner an STI. Unwanted or unplanned pregnancy. How to practice safe sex Ways you can practice safe sex  Limit your sexual partners to only one partner who is having sex with only you. Avoid using alcohol and drugs before having sex. Alcohol and drugs can affect your judgment. Before having sex with a new partner: Talk to your partner about past partners, past STIs, and drug use. Get screened for STIs and discuss the results with your partner. Ask your partner to get screened too. Check your body regularly for sores, blisters, rashes, or unusual discharge. If you notice any of these problems, visit your health care provider. Avoid sexual contact if you have symptoms of an infection or you are being treated for an STI. While having sex, use a condom. Make sure to: Use a condom every time you have vaginal, oral, or anal sex. Both females and males should wear condoms during oral sex. Keep condoms in place from the beginning to the end of sexual activity. Use a latex condom, if possible. Latex condoms offer the best protection. Use only water-based lubricants with a condom. Using petroleum-based lubricants or oils will weaken the condom and increase the chance that it will break. Ways your health care provider can help you practice safe sex  See your health care provider for regular screenings, exams, and tests for STIs. Talk with your health care provider about what kind of birth control (contraception) is best for you. Get vaccinated against hepatitis B and human papillomavirus (HPV). If you are at risk of being infected with HIV (human immunodeficiency virus), talk with your health care provider about taking a prescription medicine to prevent HIV infection. You are at risk for HIV if you: Are a man who has sex with other men. Are  sexually active with more than one partner. Take drugs by injection. Have a sex partner who has HIV. Have unprotected sex. Have sex with someone who has sex with both men and women. Have had an STI. Follow these instructions at home: Take over-the-counter and prescription medicines only as told by your health care provider. Keep all follow-up visits. This is important. Where to find more information Centers for Disease Control and Prevention: www.cdc.gov Planned Parenthood: www.plannedparenthood.org Office on Women's Health: www.womenshealth.gov Summary Practicing safe sex means taking steps before and during sex to reduce your risk getting an STI, giving your partner an STI, and having an unwanted or unplanned pregnancy. Before having sex with a new partner, talk to your partner about past partners, past STIs, and drug use. Use a condom every time you have vaginal, oral, or anal sex. Both females and males should wear condoms during oral sex. Check your body regularly for sores, blisters, rashes, or unusual discharge. If you notice any of these problems, visit your health care provider. See your health care provider for regular screenings, exams, and tests for STIs. This information is not intended to replace advice given to you by your health care provider. Make sure you discuss any questions you have with your health care provider. Document Revised: 12/22/2019 Document Reviewed: 12/22/2019 Elsevier Patient Education  2023 Elsevier Inc.  

## 2022-07-11 ENCOUNTER — Ambulatory Visit: Payer: No Typology Code available for payment source | Admitting: Internal Medicine

## 2022-07-11 ENCOUNTER — Ambulatory Visit: Payer: Self-pay | Admitting: Family Medicine

## 2022-07-11 LAB — HEPATITIS C ANTIBODY: Hepatitis C Ab: NONREACTIVE

## 2022-07-11 LAB — RPR: RPR Ser Ql: NONREACTIVE

## 2022-07-11 LAB — HIV ANTIBODY (ROUTINE TESTING W REFLEX): HIV 1&2 Ab, 4th Generation: NONREACTIVE

## 2022-07-12 LAB — CERVICOVAGINAL ANCILLARY ONLY
Bacterial Vaginitis (gardnerella): NEGATIVE
Candida Glabrata: NEGATIVE
Candida Vaginitis: POSITIVE — AB
Chlamydia: NEGATIVE
Comment: NEGATIVE
Comment: NEGATIVE
Comment: NEGATIVE
Comment: NEGATIVE
Comment: NEGATIVE
Comment: NORMAL
Neisseria Gonorrhea: NEGATIVE
Trichomonas: NEGATIVE

## 2022-07-13 ENCOUNTER — Other Ambulatory Visit: Payer: Self-pay

## 2022-07-13 ENCOUNTER — Encounter: Payer: Self-pay | Admitting: Internal Medicine

## 2022-07-13 MED ORDER — FLUCONAZOLE 150 MG PO TABS
150.0000 mg | ORAL_TABLET | Freq: Once | ORAL | 0 refills | Status: AC
  Filled 2022-07-13: qty 1, 1d supply, fill #0

## 2022-08-01 ENCOUNTER — Other Ambulatory Visit: Payer: Self-pay | Admitting: Family Medicine

## 2022-08-01 ENCOUNTER — Other Ambulatory Visit: Payer: Self-pay | Admitting: Internal Medicine

## 2022-08-01 DIAGNOSIS — I10 Essential (primary) hypertension: Secondary | ICD-10-CM

## 2022-08-02 MED ORDER — CETIRIZINE HCL 10 MG PO TABS
10.0000 mg | ORAL_TABLET | Freq: Every day | ORAL | 0 refills | Status: DC
  Filled 2022-08-02 – 2022-08-05 (×2): qty 90, 90d supply, fill #0

## 2022-08-02 MED ORDER — AMLODIPINE BESYLATE 10 MG PO TABS
10.0000 mg | ORAL_TABLET | Freq: Every day | ORAL | 0 refills | Status: DC
  Filled 2022-08-02: qty 30, 30d supply, fill #0

## 2022-08-02 NOTE — Telephone Encounter (Signed)
Requested Prescriptions  Pending Prescriptions Disp Refills   amLODipine (NORVASC) 10 MG tablet 90 tablet 0    Sig: TAKE 1 TABLET BY MOUTH DAILY.     Cardiovascular: Calcium Channel Blockers 2 Passed - 08/01/2022  7:37 PM      Passed - Last BP in normal range    BP Readings from Last 1 Encounters:  07/10/22 136/84         Passed - Last Heart Rate in normal range    Pulse Readings from Last 1 Encounters:  07/10/22 99         Passed - Valid encounter within last 6 months    Recent Outpatient Visits           3 weeks ago Exposure to trichomonas   [place], NP   5 months ago Subscapularis tendinitis of right shoulder   [facility], Earley Abide, MD   5 months ago Annual physical exam   [place], DO   7 months ago Strep throat   [place], DO   8 months ago Urinary frequency   [place], DO       Future Appointments             In 2 weeks [place], Devonne Doughty, [facility], [facility]

## 2022-08-02 NOTE — Telephone Encounter (Signed)
Requested Prescriptions  Pending Prescriptions Disp Refills   cetirizine (ZYRTEC) 10 MG tablet 90 tablet 0    Sig: Take 1 tablet (10 mg total) by mouth daily.     Ear, Nose, and Throat:  Antihistamines 2 Passed - 08/01/2022  7:37 PM      Passed - Cr in normal range and within 360 days    Creat  Date Value Ref Range Status  02/09/2022 0.79 0.50 - 0.99 mg/dL Final         Passed - Valid encounter within last 12 months    Recent Outpatient Visits           3 weeks ago Exposure to trichomonas   [place], NP   5 months ago Subscapularis tendinitis of right shoulder   [facility], Earley Abide, MD   5 months ago Annual physical exam   [place], DO   7 months ago Strep throat   [place], DO   8 months ago Urinary frequency   [place], DO       Future Appointments             In 2 weeks [place] [facility], Devonne Doughty, [facility], [facility]

## 2022-08-03 ENCOUNTER — Other Ambulatory Visit (HOSPITAL_COMMUNITY): Payer: Self-pay

## 2022-08-05 ENCOUNTER — Other Ambulatory Visit (HOSPITAL_COMMUNITY): Payer: Self-pay

## 2022-08-18 ENCOUNTER — Ambulatory Visit: Payer: No Typology Code available for payment source | Admitting: Family Medicine

## 2022-10-24 ENCOUNTER — Ambulatory Visit: Payer: 59 | Admitting: Family Medicine

## 2023-01-02 ENCOUNTER — Other Ambulatory Visit: Payer: Self-pay | Admitting: Family Medicine

## 2023-01-02 DIAGNOSIS — Z1231 Encounter for screening mammogram for malignant neoplasm of breast: Secondary | ICD-10-CM

## 2023-02-20 ENCOUNTER — Ambulatory Visit
Admission: RE | Admit: 2023-02-20 | Discharge: 2023-02-20 | Disposition: A | Discharge status: Home / Self Care | Payer: 59 | Source: Ambulatory Visit | Attending: Family Medicine | Admitting: Family Medicine

## 2023-02-20 ENCOUNTER — Other Ambulatory Visit: Payer: Self-pay | Admitting: Family Medicine

## 2023-02-20 DIAGNOSIS — Z1231 Encounter for screening mammogram for malignant neoplasm of breast: Secondary | ICD-10-CM

## 2023-02-21 ENCOUNTER — Ambulatory Visit: Payer: 59

## 2023-03-09 ENCOUNTER — Ambulatory Visit
Admission: EM | Admit: 2023-03-09 | Discharge: 2023-03-09 | Disposition: A | Discharge status: Home / Self Care | Payer: 59 | Attending: Internal Medicine | Admitting: Internal Medicine

## 2023-03-09 DIAGNOSIS — N760 Acute vaginitis: Secondary | ICD-10-CM | POA: Insufficient documentation

## 2023-03-09 DIAGNOSIS — B9689 Other specified bacterial agents as the cause of diseases classified elsewhere: Secondary | ICD-10-CM | POA: Diagnosis not present

## 2023-03-09 DIAGNOSIS — B3731 Acute candidiasis of vulva and vagina: Secondary | ICD-10-CM | POA: Diagnosis not present

## 2023-03-09 LAB — URINALYSIS, W/ REFLEX TO CULTURE (INFECTION SUSPECTED)
Bilirubin Urine: NEGATIVE
Glucose, UA: NEGATIVE mg/dL
Nitrite: NEGATIVE
Protein, ur: NEGATIVE mg/dL
Specific Gravity, Urine: 1.02 (ref 1.005–1.030)
pH: 6.5 (ref 5.0–8.0)

## 2023-03-09 LAB — WET PREP, GENITAL
Sperm: NONE SEEN
Trich, Wet Prep: NONE SEEN
WBC, Wet Prep HPF POC: <10 (ref <10)

## 2023-03-09 MED ORDER — FLUCONAZOLE 150 MG PO TABS: 150.0000 mg | ORAL_TABLET | Freq: Every day | ORAL | 0 refills | Status: DC

## 2023-03-09 MED ORDER — METRONIDAZOLE 500 MG PO TABS: 500.0000 mg | ORAL_TABLET | Freq: Two times a day (BID) | ORAL | 0 refills | Status: DC

## 2023-03-09 NOTE — ED Provider Notes (Signed)
[facility]    CSN: [id number] Arrival date & time: 03/09/23  0814      History   Chief Complaint Chief Complaint  Patient presents with   Vaginal Itching    HPI Linda Vega is a 47 y.o. female who presents with vaginal itching and swelling x 1 week. Denies discharge.  Slight dusiria is present, and seems to be more on the labia. Has not tried any meds.  Not concerned of STD's Admits she sweats from exercising and does not always change right away.  Has hx of vaginal herpes, but does not feel pain.    Past Medical History:  Diagnosis Date   Anxiety    Arthritis    DDD   BRCA negative 12/2019   MyRisk neg except AXIN2 VUS   Carpal tunnel syndrome right   Family history of breast cancer 12/2019   IBIS=16.7%   GERD (gastroesophageal reflux disease)    Headache    h/o migraines   Hypertension     Patient Active Problem List   Diagnosis Date Noted   Subscapularis tendinitis of right shoulder 02/13/2022   Pure hypercholesterolemia 02/09/2022   Elevated hemoglobin A1c 02/01/2021   Gastroesophageal reflux disease without esophagitis 02/01/2021   Encounter for screening colonoscopy    Tingling 11/12/2020   Nerve pain 10/06/2020   Left arm pain 10/06/2020   Carpal tunnel syndrome of right wrist 10/06/2020   Fibroadenoma of breast, left    Abnormal uterine bleeding (AUB) 02/06/2020   Family history of breast cancer 01/20/2020   Osteoarthritis of spine with radiculopathy, lumbar region 07/16/2019   Low back pain 03/11/2018   Obesity (BMI 35.0-39.9 without comorbidity) 09/14/2017   Anemia associated with acute blood loss 08/27/2017   Snoring 08/05/2017   Vitamin D deficiency 08/03/2017   Migraine without aura and without status migrainosus, not intractable 04/12/2017   Iron deficiency anemia secondary to inadequate dietary iron intake 09/03/2016   Essential hypertension 05/29/2015   Anxiety 03/22/2015   Positive PPD 06/04/2012   Genital herpes  02/07/2011    Past Surgical History:  Procedure Laterality Date   ABDOMINAL HYSTERECTOMY     BREAST BIOPSY Right 11/23/2017   US guided breast biopsy of 2:00 mass, coil marker, cellular fibroadenoma   BREAST BIOPSY Left 07/05/2020   [place] Bx, 3:00, Heart Clip   RF tag [id number].   BREAST BIOPSY Left 07/05/2020   [place] Bx, 5:00, X Clip  RF tag [id number]   BREAST CYST ASPIRATION Left    neg   BREAST EXCISIONAL BIOPSY Left 07/29/2020   BENIGN FIBROADENOMA   BREAST EXCISIONAL BIOPSY Left 07/29/2020   COMPLEX FIBROADENOMA   BREAST LUMPECTOMY WITH RADIOFREQUENCY TAG IDENTIFICATION Left 07/29/2020   Procedure: BREAST LUMPECTOMY WITH RADIOFREQUENCY TAG IDENTIFICATION, lumpectomy x 2;  Surgeon: Henrene Dodge, MD;  Location: [facility];  Service: General;  Laterality: Left;   CESAREAN SECTION     COLONOSCOPY WITH PROPOFOL N/A 01/17/2021   Procedure: COLONOSCOPY WITH PROPOFOL;  Surgeon: Midge Minium, MD;  Location: [facility];  Service: Endoscopy;  Laterality: N/A;   CYSTOSCOPY N/A 10/14/2019   Procedure: CYSTOSCOPY;  Surgeon: Natale Milch, MD;  Location: [facility];  Service: Gynecology;  Laterality: N/A;   fibrocystic breast     IUD REMOVAL N/A 10/14/2019   Procedure: INTRAUTERINE DEVICE (IUD) REMOVAL;  Surgeon: Natale Milch, MD;  Location: [facility];  Service: Gynecology;  Laterality: N/A;   LAPAROSCOPIC APPENDECTOMY N/A 02/17/2021   Procedure: APPENDECTOMY LAPAROSCOPIC PARTIAL CECECTOMY;  Surgeon: Leafy Ro, MD;  Location: [facility];  Service: General;  Laterality: N/A;   LAPAROSCOPY FOR ECTOPIC PREGNANCY     TOTAL LAPAROSCOPIC HYSTERECTOMY WITH SALPINGECTOMY Right 10/14/2019   Procedure: TOTAL LAPAROSCOPIC HYSTERECTOMY WITH RIGHT SALPINGECTOMY;  Surgeon: Natale Milch, MD;  Location: [facility];  Service: Gynecology;  Laterality: Right;    OB History     Gravida  2   Para  1   Term  1   Preterm  0   AB  1   Living         SAB  0   IAB   0   Ectopic  1   Multiple      Live Births               Home Medications    Prior to Admission medications   Medication Sig Start Date End Date Taking? Authorizing Provider  amLODipine (NORVASC) 10 MG tablet Take 1 tablet (10 mg total) by mouth daily. 08/02/22  Yes Karamalegos, Netta Neat, DO  baclofen (LIORESAL) 10 MG tablet Take 0.5-1 tablets (5-10 mg total) by mouth 3 (three) times daily as needed for muscle spasms. 11/11/21  Yes Karamalegos, Netta Neat, DO  cetirizine (ZYRTEC) 10 MG tablet Take 1 tablet (10 mg total) by mouth daily. 08/02/22  Yes Lorre Munroe, NP  fluconazole (DIFLUCAN) 150 MG tablet Take 1 tablet (150 mg total) by mouth daily. 03/09/23  Yes Rodriguez-Southworth, Nettie Elm, PA-C  fluticasone (FLONASE) 50 MCG/ACT nasal spray Place 2 sprays into both nostrils daily. Use for 4-6 weeks then stop and use seasonally or as needed. 06/16/21  Yes Lorre Munroe, NP  ibuprofen (ADVIL) 800 MG tablet Take 1 tablet (800 mg total) by mouth every 8 (eight) hours as needed. 03/01/22  Yes Karamalegos, Netta Neat, DO  metroNIDAZOLE (FLAGYL) 500 MG tablet Take 1 tablet (500 mg total) by mouth 2 (two) times daily. 03/09/23  Yes Rodriguez-Southworth, Nettie Elm, PA-C  traMADol (ULTRAM-ER) 100 MG 24 hr tablet Take 100 mg by mouth daily.   Yes [provider]  valACYclovir (VALTREX) 500 MG tablet     [provider]    Family History Family History  Problem Relation Age of Onset   Hypertension Mother    Breast cancer Maternal Aunt        78s   Breast cancer Maternal Grandmother 20   Breast cancer Maternal Aunt        37s    Social History Social History   Tobacco Use   Smoking status: Never   Smokeless tobacco: Never  Vaping Use   Vaping status: Never Used  Substance Use Topics   Alcohol use: Yes    Comment: occasional, about 3x a month   Drug use: No     Allergies   Shellfish allergy, Egg-derived products, and Lactose intolerance (gi)   Review of  Systems Review of Systems As noted in HPI  Physical Exam Triage Vital Signs ED Triage Vitals  Encounter Vitals Group     BP 03/09/23 0831 (!) 141/90     Systolic BP Percentile --      Diastolic BP Percentile --      Pulse Rate 03/09/23 0831 91     Resp --      Temp 03/09/23 0831 98.4 F (36.9 C)     Temp Source 03/09/23 0831 Oral     SpO2 03/09/23 0831 100 %     Weight 03/09/23 0830 250 lb (113.4 kg)  Height 03/09/23 0830 5\' 6"  (1.676 m)     Head Circumference --      Peak Flow --      Pain Score 03/09/23 0830 0     Pain Loc --      Pain Education --      Exclude from Growth Chart --    No data found.  Updated Vital Signs BP (!) 141/90 (BP Location: Left Arm)   Pulse 91   Temp 98.4 F (36.9 C) (Oral)   Ht 5\' 6"  (1.676 m)   Wt 250 lb (113.4 kg)   LMP 10/04/2019 (Exact Date)   SpO2 100%   BMI 40.35 kg/m   Visual Acuity Right Eye Distance:   Left Eye Distance:   Bilateral Distance:    Right Eye Near:   Left Eye Near:    Bilateral Near:     Physical Exam Vitals and nursing note reviewed.  Constitutional:      General: She is not in acute distress.    Appearance: She is obese. She is not toxic-appearing.  HENT:     Right Ear: External ear normal.     Left Ear: External ear normal.  Eyes:     General: No scleral icterus.    Conjunctiva/sclera: Conjunctivae normal.  Pulmonary:     Effort: Pulmonary effort is normal.  Genitourinary:    General: Normal vulva.     Vagina: No vaginal discharge.     Comments: I noticed a fishy odor Musculoskeletal:        General: Normal range of motion.     Cervical back: Neck supple.  Skin:    General: Skin is warm and dry.  Neurological:     Mental Status: She is alert and oriented to person, place, and time.     Gait: Gait normal.  Psychiatric:        Mood and Affect: Mood normal.        Behavior: Behavior normal.        Thought Content: Thought content normal.        Judgment: Judgment normal.      UC  Treatments / Results  Labs (all labs ordered are listed, but only abnormal results are displayed) Labs Reviewed  WET PREP, GENITAL - Abnormal; Notable for the following components:      Result Value   Yeast Wet Prep HPF POC PRESENT (*)    Clue Cells Wet Prep HPF POC PRESENT (*)    All other components within normal limits  URINALYSIS, W/ REFLEX TO CULTURE (INFECTION SUSPECTED) - Abnormal; Notable for the following components:   APPearance CLOUDY (*)    Hgb urine dipstick TRACE (*)    Ketones, ur TRACE (*)    Leukocytes,Ua SMALL (*)    Bacteria, UA MANY (*)    All other components within normal limits    EKG   Radiology No results found.  Procedures Procedures (including critical care time)  Medications Ordered in UC Medications - No data to display  Initial Impression / Assessment and Plan / UC Course  I have reviewed the triage vital signs and the nursing notes.  Pertinent labs  results that were available during my care of the patient were reviewed by me and considered in my medical decision making (see chart for details).  BV Candida vaginitis  I placed her on Diflucan and Flagyl as noted.  Urine was sent for a culture and we will inform her if treatment is needed. I believe most  of the bacterial in the urine is from the BV, so I dont feel treatment is needed right now specially since she does not have dysuria, but has had vaginal burning.    Final Clinical Impressions(s) / UC Diagnoses   Final diagnoses:  Bacterial vaginitis  Vaginal yeast infection   Discharge Instructions   None    ED Prescriptions     Medication Sig Dispense Auth. Provider   metroNIDAZOLE (FLAGYL) 500 MG tablet Take 1 tablet (500 mg total) by mouth 2 (two) times daily. 14 tablet Rodriguez-Southworth, , PA-C   fluconazole (DIFLUCAN) 150 MG tablet Take 1 tablet (150 mg total) by mouth daily. 2 tablet Rodriguez-Southworth, Nettie Elm, PA-C      PDMP not reviewed this encounter.    Garey Ham, [place] 03/09/23 [id number]

## 2023-03-09 NOTE — ED Triage Notes (Signed)
Pt c/o vaginal itching and swelling x1week

## 2023-03-28 ENCOUNTER — Encounter: Payer: Self-pay | Admitting: Family Medicine

## 2023-03-28 ENCOUNTER — Ambulatory Visit (INDEPENDENT_AMBULATORY_CARE_PROVIDER_SITE_OTHER): Payer: 59 | Admitting: Family Medicine

## 2023-03-28 VITALS — BP 124/78 | Ht 66.0 in | Wt 252.0 lb

## 2023-03-28 DIAGNOSIS — L910 Hypertrophic scar: Secondary | ICD-10-CM

## 2023-03-28 DIAGNOSIS — Z6841 Body Mass Index (BMI) 40.0 and over, adult: Secondary | ICD-10-CM | POA: Diagnosis not present

## 2023-03-28 MED ORDER — TRIAMCINOLONE ACETONIDE 0.5 % EX CREA: 1.0000 | TOPICAL_CREAM | Freq: Two times a day (BID) | CUTANEOUS | 2 refills | Status: AC

## 2023-03-28 MED ORDER — OZEMPIC (0.25 OR 0.5 MG/DOSE) 2 MG/3ML ~~LOC~~ SOPN: 0.2500 mg | PEN_INJECTOR | SUBCUTANEOUS | Status: DC

## 2023-03-28 NOTE — Patient Instructions (Addendum)
Thank you for coming to the office today.  Likely the skin spots are a Hypertrophic Scar / Keloid., caused by the burn or injury to the skin. These can be raised shiny and itchy.  Use the topical Triamcinolone cream twice a day for 2 weeks.  --------------------  For Weight Loss / Obesity only  Start Sample ozempic (semaglutide) 0.25mg  weekly for 4 weeks, and then go up dial dose to 0.5mg  weekly for 2 weeks, so 6 week total sample.  Contact me after 4-5 weeks, if you are doing well and would like me to order the Semaglutide injection (vial / syringe) from [facility], it would be 0.5mg  weekly.  Semaglutide injection (mixed Ozempic) from [employer] [facility]  It comes in a vial and a needle syringe, you need to draw up the shot and self admin it weekly Cost is about $200-300 per month Call them to check pricing and availability  [facility] Address: [address], [place], [place] [address] Phone: [phone]  --------------------------------  [facility] in [place], does not accept insurance. Call to schedule / Walk in to schedule They can do the weekly weight loss injections (same as Ozempic) Pay per visit and per shot. It may be approximately $60-75+ per visit/shot, approximately $200-300 range per month depending  [facility] Address: [address], [place], [place] [address] Phone: [phone]   Please schedule a Follow-up Appointment to: Return if symptoms worsen or fail to improve.  If you have any other questions or concerns, please feel free to call the office or send a message through MyChart. You may also schedule an earlier appointment if necessary.  Additionally, you may be receiving a survey about your experience at our office within a few days to 1 week by e-mail or mail. We value your feedback.  Saralyn Pilar, DO [facility], [place]

## 2023-03-28 NOTE — Progress Notes (Unsigned)
Subjective:    Patient ID: Linda Vega, female    DOB: Aug 31, 1975, 47 y.o.   MRN: [id number]  Linda Vega is a 47 y.o. female presenting on 03/28/2023 for Obesity and Skin Problem (Right wrist, present about a 1.5 weeks)   HPI  Wellness / Lifestyle Exercise - will resume exercise when foot is improved Diet - improved, goal to be more consistent   CHRONIC HTN: Checking BP at home overall improved results Current Meds - Amlodipine 10mg  - Previously on Propranolol for migraine prophylaxis has been off of this for while now Reports good compliance, took meds today. Tolerating well, w/o complaints.   Pre-Diabetes Reports no concerns Prior results 5.7 to 5.8 Due today for labs Meds: none Lifestyle: - Diet (goal to improve)  Denies hypoglycemia, polyuria, visual changes, numbness or tingling.   Morbid Obesity BMI >40 Goal for medicated weight management in addition to continuing lifestyle diet exercise Interested in product Thin 30 Probiotic       03/28/2023    3:30 PM 02/09/2022    1:08 PM 12/30/2021    3:31 PM  Depression screen PHQ 2/9  Decreased Interest 0 0 0  Down, Depressed, Hopeless 0 0   PHQ - 2 Score 0 0 0  Altered sleeping  0 0  Tired, decreased energy  0 0  Change in appetite  0 0  Feeling bad or failure about yourself   0 0  Trouble concentrating  0 1  Moving slowly or fidgety/restless  0 0  Suicidal thoughts  0 0  PHQ-9 Score  0 1  Difficult doing work/chores  Not difficult at all Not difficult at all    Social History   Tobacco Use   Smoking status: Never   Smokeless tobacco: Never  Vaping Use   Vaping status: Never Used  Substance Use Topics   Alcohol use: Yes    Comment: occasional, about 3x a month   Drug use: No    Review of Systems Per HPI unless specifically indicated above     Objective:    BP 124/78   Ht 5\' 6"  (1.676 m)   Wt 252 lb (114.3 kg)   LMP 10/04/2019 (Exact Date)   BMI 40.67 kg/m   Wt Readings from Last 3  Encounters:  03/28/23 252 lb (114.3 kg)  03/09/23 250 lb (113.4 kg)  07/10/22 243 lb (110.2 kg)    Physical Exam Vitals and nursing note reviewed.  Constitutional:      General: She is not in acute distress.    Appearance: Normal appearance. She is well-developed. She is obese. She is not diaphoretic.     Comments: Well-appearing, comfortable, cooperative  HENT:     Head: Normocephalic and atraumatic.  Eyes:     General:        Right eye: No discharge.        Left eye: No discharge.     Conjunctiva/sclera: Conjunctivae normal.  Cardiovascular:     Rate and Rhythm: Normal rate.  Pulmonary:     Effort: Pulmonary effort is normal.  Skin:    General: Skin is warm and dry.     Findings: Lesion (R wrist hypertrophic keloid scar on burn site) present. No erythema or rash.  Neurological:     Mental Status: She is alert and oriented to person, place, and time.  Psychiatric:        Mood and Affect: Mood normal.        Behavior: Behavior normal.  Thought Content: Thought content normal.     Comments: Well groomed, good eye contact, normal speech and thoughts       Results for orders placed or performed during the hospital encounter of 03/09/23  Wet prep, genital   Specimen: Vaginal  Result Value Ref Range   Yeast Wet Prep HPF POC PRESENT (A) NONE SEEN   Trich, Wet Prep NONE SEEN NONE SEEN   Clue Cells Wet Prep HPF POC PRESENT (A) NONE SEEN   WBC, Wet Prep HPF POC <10 <10   Sperm NONE SEEN   Urinalysis, w/ Reflex to Culture (Infection Suspected) -Urine, Clean Catch  Result Value Ref Range   Specimen Source URINE, CLEAN CATCH    Color, Urine YELLOW YELLOW   APPearance CLOUDY (A) CLEAR   Specific Gravity, Urine 1.020 1.005 - 1.030   pH 6.5 5.0 - 8.0   Glucose, UA NEGATIVE NEGATIVE mg/dL   Hgb urine dipstick TRACE (A) NEGATIVE   Bilirubin Urine NEGATIVE NEGATIVE   Ketones, ur TRACE (A) NEGATIVE mg/dL   Protein, ur NEGATIVE NEGATIVE mg/dL   Nitrite NEGATIVE NEGATIVE    Leukocytes,Ua SMALL (A) NEGATIVE   Squamous Epithelial / HPF 11-20 0 - 5 /HPF   WBC, UA 6-10 0 - 5 WBC/hpf   RBC / HPF 6-10 0 - 5 RBC/hpf   Bacteria, UA MANY (A) NONE SEEN   Budding Yeast PRESENT       Assessment & Plan:   Problem List Items Addressed This Visit   None Visit Diagnoses     Morbid obesity with BMI of 40.0-44.9, adult (HCC)    -  Primary   Relevant Medications   Semaglutide,0.25 or 0.5MG /DOS, (OZEMPIC, 0.25 OR 0.5 MG/DOSE,) 2 MG/3ML SOPN   Hypertrophic scar of skin       Relevant Medications   triamcinolone cream (KENALOG) 0.5 %       Likely the skin spots are a Hypertrophic Scar / Keloid., caused by the burn or injury to the skin. These can be raised shiny and itchy.  Use the topical Triamcinolone cream twice a day for 2 weeks.  --------------------  For Weight Loss / Obesity only  Start Sample ozempic (semaglutide) 0.25mg  weekly for 4 weeks, and then go up dial dose to 0.5mg  weekly for 2 weeks, so 6 week total sample.  Contact me after 4-5 weeks, if you are doing well and would like me to order the Semaglutide injection (vial / syringe) from [facility], it would be 0.5mg  weekly.  Semaglutide injection (mixed Ozempic) from [facility]  Meds ordered this encounter  Medications   triamcinolone cream (KENALOG) 0.5 %    Sig: Apply 1 Application topically 2 (two) times daily. To affected areas, for up to 2 weeks.    Dispense:  30 g    Refill:  2   Semaglutide,0.25 or 0.5MG /DOS, (OZEMPIC, 0.25 OR 0.5 MG/DOSE,) 2 MG/3ML SOPN    Sig: Inject 0.25 mg into the skin once a week.      Follow up plan: Return if symptoms worsen or fail to improve.   Saralyn Pilar, DO [facility] 03/28/2023, 3:52 PM

## 2023-03-29 ENCOUNTER — Encounter: Payer: Self-pay | Admitting: Family Medicine

## 2023-04-08 ENCOUNTER — Encounter (INDEPENDENT_AMBULATORY_CARE_PROVIDER_SITE_OTHER): Payer: 59 | Admitting: Family Medicine

## 2023-04-08 DIAGNOSIS — R051 Acute cough: Secondary | ICD-10-CM

## 2023-04-08 DIAGNOSIS — U071 COVID-19: Secondary | ICD-10-CM

## 2023-04-09 MED ORDER — IPRATROPIUM BROMIDE 0.06 % NA SOLN: 2.0000 | Freq: Four times a day (QID) | NASAL | 0 refills | Status: DC

## 2023-04-09 MED ORDER — GUAIFENESIN-CODEINE 100-10 MG/5ML PO SYRP: 5.0000 mL | ORAL_SOLUTION | Freq: Four times a day (QID) | ORAL | 0 refills | Status: DC | PRN

## 2023-04-09 NOTE — Telephone Encounter (Signed)
Please see the MyChart message reply(ies) for my assessment and plan.    This patient gave consent for this Medical Advice Message and is aware that it may result in a bill to their insurance company, as well as the possibility of receiving a bill for a co-payment or deductible. They are an established patient, but are not seeking medical advice exclusively about a problem treated during an in person or video visit in the last seven days. I did not recommend an in person or video visit within seven days of my reply.    I spent a total of 7 minutes cumulative time within 7 days through MyChart messaging.  Alexander Karamalegos, DO   

## 2023-05-08 ENCOUNTER — Ambulatory Visit: Payer: 59 | Admitting: Family Medicine

## 2023-05-22 ENCOUNTER — Encounter: Payer: Self-pay | Admitting: Family Medicine

## 2023-05-27 ENCOUNTER — Encounter: Payer: Self-pay | Admitting: Family Medicine

## 2023-05-30 ENCOUNTER — Telehealth (INDEPENDENT_AMBULATORY_CARE_PROVIDER_SITE_OTHER): Payer: 59 | Admitting: Family Medicine

## 2023-05-30 ENCOUNTER — Encounter: Payer: Self-pay | Admitting: Family Medicine

## 2023-05-30 DIAGNOSIS — R7309 Other abnormal glucose: Secondary | ICD-10-CM

## 2023-05-30 DIAGNOSIS — I10 Essential (primary) hypertension: Secondary | ICD-10-CM

## 2023-05-30 DIAGNOSIS — Z6839 Body mass index (BMI) 39.0-39.9, adult: Secondary | ICD-10-CM | POA: Diagnosis not present

## 2023-05-30 NOTE — Patient Instructions (Signed)
° °  Please schedule a Follow-up Appointment to: No follow-ups on file. ° °If you have any other questions or concerns, please feel free to call the office or send a message through MyChart. You may also schedule an earlier appointment if necessary. ° °Additionally, you may be receiving a survey about your experience at our office within a few days to 1 week by e-mail or mail. We value your feedback. ° °Laster Appling, DO °[facility], [facility] °

## 2023-05-30 NOTE — Progress Notes (Signed)
Subjective:    Patient ID: Linda Vega, female    DOB: 1976/03/11, 47 y.o.   MRN: [id number]  Linda Vega is a 47 y.o. female presenting on 05/30/2023 for Obesity  Virtual / Telehealth Encounter - Video Visit via MyChart The purpose of this virtual visit is to provide medical care while limiting exposure to the novel coronavirus (COVID19) for both patient and office staff.  Consent was obtained for remote visit:  Yes.   Answered questions that patient had about telehealth interaction:  Yes.   I discussed the limitations, risks, security and privacy concerns of performing an evaluation and management service by video/telephone. I also discussed with the patient that there may be a patient responsible charge related to this service. The patient expressed understanding and agreed to proceed.  Patient Location: Home Provider Location: [facility] (Office)  Participants in virtual visit: - Patient: Linda Vega - CMA: Shirley Muscat CMA - Provider: Dr Althea Charon  Discussed the use of AI scribe software for clinical note transcription with the patient, who gave verbal consent to proceed.  HPI  Morbid Obesity BMI >39 Weight Management  Last visit with me August 2024  Started Ozempic sample for weight loss 04/01/23 0.1m5g dose x 4 week then up to 0.5mg  She admits significant reduced appetite and overall did not feel well on medicine due to this and GI side effects. She finished the 6 week sample She started at weight 252 lbs (03/28/23) and lost 6 lbs to 246 lbs now She has pursued diet and exercise regimens for >6 months without success overall Today she asks about Almased for weight loss, OTC protein shake meal regimen and supplement.  Other comorbid conditions  Hypertension Pre-Diabetes Not addressed today       03/28/2023    3:30 PM 02/09/2022    1:08 PM 12/30/2021    3:31 PM  Depression screen PHQ 2/9  Decreased Interest 0 0 0  Down, Depressed,  Hopeless 0 0   PHQ - 2 Score 0 0 0  Altered sleeping  0 0  Tired, decreased energy  0 0  Change in appetite  0 0  Feeling bad or failure about yourself   0 0  Trouble concentrating  0 1  Moving slowly or fidgety/restless  0 0  Suicidal thoughts  0 0  PHQ-9 Score  0 1  Difficult doing work/chores  Not difficult at all Not difficult at all    Social History   Tobacco Use   Smoking status: Never   Smokeless tobacco: Never  Vaping Use   Vaping status: Never Used  Substance Use Topics   Alcohol use: Yes    Comment: occasional, about 3x a month   Drug use: No    Review of Systems Per HPI unless specifically indicated above     Objective:    Ht 5\' 6"  (1.676 m)   Wt 246 lb (111.6 kg)   LMP 10/04/2019 (Exact Date)   BMI 39.71 kg/m   Wt Readings from Last 3 Encounters:  05/30/23 246 lb (111.6 kg)  03/28/23 252 lb (114.3 kg)  03/09/23 250 lb (113.4 kg)    Physical Exam  Note examination was completely remotely via video observation objective data only  Gen -Obese, well-appearing, no acute distress or apparent pain, comfortable HEENT - eyes appear clear without discharge or redness Heart/Lungs - cannot examine virtually - observed no evidence of coughing or labored breathing. Abd - cannot examine virtually  Skin -  face visible today- no rash Neuro - awake, alert, oriented Psych - not anxious appearing   Results for orders placed or performed during the hospital encounter of 03/09/23  Wet prep, genital   Specimen: Vaginal  Result Value Ref Range   Yeast Wet Prep HPF POC PRESENT (A) NONE SEEN   Trich, Wet Prep NONE SEEN NONE SEEN   Clue Cells Wet Prep HPF POC PRESENT (A) NONE SEEN   WBC, Wet Prep HPF POC <10 <10   Sperm NONE SEEN   Urinalysis, w/ Reflex to Culture (Infection Suspected) -Urine, Clean Catch  Result Value Ref Range   Specimen Source URINE, CLEAN CATCH    Color, Urine YELLOW YELLOW   APPearance CLOUDY (A) CLEAR   Specific Gravity, Urine 1.020 1.005  - 1.030   pH 6.5 5.0 - 8.0   Glucose, UA NEGATIVE NEGATIVE mg/dL   Hgb urine dipstick TRACE (A) NEGATIVE   Bilirubin Urine NEGATIVE NEGATIVE   Ketones, ur TRACE (A) NEGATIVE mg/dL   Protein, ur NEGATIVE NEGATIVE mg/dL   Nitrite NEGATIVE NEGATIVE   Leukocytes,Ua SMALL (A) NEGATIVE   Squamous Epithelial / HPF 11-20 0 - 5 /HPF   WBC, UA 6-10 0 - 5 WBC/hpf   RBC / HPF 6-10 0 - 5 RBC/hpf   Bacteria, UA MANY (A) NONE SEEN   Budding Yeast PRESENT       Assessment & Plan:   Problem List Items Addressed This Visit     Elevated hemoglobin A1c   Essential hypertension   Morbid obesity (HCC) - Primary    Trial of Ozempic for weight loss resulted in significant side effects including nausea, stomach pain, and loss of appetite leading to discontinuation. Patient lost 6 pounds over the course of treatment 6 weeks. Patient plans to try over-the-counter weight loss powder, Almased now. Protein meal supplement  -Encouraged patient to monitor progress with Almased and report any concerns or side effects.  -Documented trial and discontinuation of Ozempic for future reference. -Suggested potential future trial of Zepbound if it becomes covered by insurance due to its reported better  tolerability.        No orders of the defined types were placed in this encounter.     Follow up plan: Return if symptoms worsen or fail to improve.  Patient verbalizes understanding with the above medical recommendations including the limitation of remote medical advice.  Specific follow-up and call-back criteria were given for patient to follow-up or seek medical care more urgently if needed.  Total duration of direct patient care provided via video conference: 10 minutes   Saralyn Pilar, DO [facility] 05/30/2023, 12:00 PM

## 2023-07-31 ENCOUNTER — Ambulatory Visit: Payer: 59 | Admitting: Family Medicine

## 2023-08-14 ENCOUNTER — Other Ambulatory Visit (HOSPITAL_COMMUNITY): Payer: Self-pay

## 2023-08-15 ENCOUNTER — Other Ambulatory Visit: Payer: Self-pay

## 2023-08-15 ENCOUNTER — Other Ambulatory Visit (HOSPITAL_COMMUNITY): Payer: Self-pay

## 2023-08-15 MED ORDER — AMLODIPINE BESYLATE 10 MG PO TABS
10.0000 mg | ORAL_TABLET | Freq: Every day | ORAL | 0 refills | Status: DC
  Filled 2023-08-15: qty 90, 90d supply, fill #0

## 2023-08-15 MED ORDER — VALACYCLOVIR HCL 1 G PO TABS
ORAL_TABLET | ORAL | 3 refills | Status: DC
  Filled 2023-08-15: qty 20, 10d supply, fill #0

## 2023-08-16 ENCOUNTER — Other Ambulatory Visit: Payer: Self-pay

## 2023-08-20 ENCOUNTER — Ambulatory Visit: Payer: Medicaid Other | Admitting: Family Medicine

## 2023-08-20 ENCOUNTER — Other Ambulatory Visit: Payer: Self-pay

## 2023-08-20 MED ORDER — TRIAMCINOLONE ACETONIDE 55 MCG/ACT NA AERO: 2.0000 | INHALATION_SPRAY | Freq: Every day | NASAL | 4 refills | Status: AC

## 2023-09-05 ENCOUNTER — Encounter: Payer: Self-pay | Admitting: Family Medicine

## 2023-09-06 ENCOUNTER — Other Ambulatory Visit: Payer: Self-pay

## 2023-09-06 ENCOUNTER — Other Ambulatory Visit (HOSPITAL_COMMUNITY): Payer: Self-pay

## 2023-09-07 ENCOUNTER — Other Ambulatory Visit: Payer: Self-pay | Admitting: Family Medicine

## 2023-09-14 ENCOUNTER — Encounter: Payer: Self-pay | Admitting: Family Medicine

## 2023-09-14 ENCOUNTER — Ambulatory Visit: Payer: Self-pay | Admitting: Family Medicine

## 2023-09-14 ENCOUNTER — Other Ambulatory Visit (HOSPITAL_COMMUNITY): Payer: Self-pay

## 2023-09-14 ENCOUNTER — Other Ambulatory Visit: Payer: Self-pay

## 2023-09-14 DIAGNOSIS — M62830 Muscle spasm of back: Secondary | ICD-10-CM

## 2023-09-14 DIAGNOSIS — M48061 Spinal stenosis, lumbar region without neurogenic claudication: Secondary | ICD-10-CM

## 2023-09-14 DIAGNOSIS — M4726 Other spondylosis with radiculopathy, lumbar region: Secondary | ICD-10-CM

## 2023-09-14 DIAGNOSIS — M62838 Other muscle spasm: Secondary | ICD-10-CM

## 2023-09-14 MED ORDER — TRAMADOL HCL 50 MG PO TABS
50.0000 mg | ORAL_TABLET | Freq: Four times a day (QID) | ORAL | 2 refills | Status: AC | PRN
  Filled 2023-09-14: qty 30, 4d supply, fill #0

## 2023-09-14 MED ORDER — BACLOFEN 10 MG PO TABS
5.0000 mg | ORAL_TABLET | Freq: Three times a day (TID) | ORAL | 3 refills | Status: AC | PRN
  Filled 2023-09-14: qty 90, 30d supply, fill #0

## 2023-09-14 NOTE — Patient Instructions (Addendum)
Thank you for coming to the office today.  Re ordered Tramadol 50mg  to [facility] for mail delivery - +2 refills  Zepbound TO Check Cost & Coverage of Zepbound Please contact Research officer, trade union (manufacturer for [employer]) 1-800-LillyRx [phone]) - Live agent to discuss cost and coverage.   OVFIEP Verified [email or web address]     For Weight Loss / Obesity only   Contrave - oral medication, appetite suppression has wellbutrin/bupropion and naltrexone in it and it can also help with appetite, it is ordered through a speciality pharmacy. - $99 per month   Try 1 week sample Contrave - Week 1: 1 pill daily with meal - Week 2: 1 pill twice a day with meal - Week 3: 2 pill with meal in AM and 1 pill with PM meal - Week 4: 2 pill twice a day with meal   Please notify me after 1 week, and if doing well on Contrave, I can order to [facility] pharmacy $99 per month plan with this med.     Free sample 7 day, 1 pill per day for 1 week   Alternative options   Semaglutide injection (mixed Ozempic) from [facility] 0.25mg  weekly for 4 weeks then increase to 0.5mg  weekly It comes in a vial and a needle syringe, you need to draw up the shot and self admin it weekly Cost is about $200 per month Call them to check pricing and availability   [facility] Address: [address] [place], [place], [place] [address] Phone: [phone]   -----------------   [email or web address]   [email or web address]   Materials engineer Team With insurance $79/mo + copay for medications Without insurance $79/mo + $99/mo to include medication (Semaglutide) Without insurance $79/mo + $199/mo to include medication (Tirzepatide) [email or web address]   Please schedule a Follow-up Appointment to: Return if symptoms worsen or fail to improve.  If you have any other questions or concerns, please feel free to call the office or  send a message through MyChart. You may also schedule an earlier appointment if necessary.  Additionally, you may be receiving a survey about your experience at our office within a few days to 1 week by e-mail or mail. We value your feedback.  Saralyn Pilar, DO [facility], [place]

## 2023-09-14 NOTE — Progress Notes (Signed)
Subjective:    Patient ID: Linda Vega, female    DOB: 15-Sep-1975, 48 y.o.   MRN: [id number]  Linda Vega is a 48 y.o. female presenting on 09/14/2023 for Obesity (/)   HPI  Discussed the use of AI scribe software for clinical note transcription with the patient, who gave verbal consent to proceed.  History of Present Illness   Linda Vega is a 48 year old female who presents for weight management and medication refill.      R Ear Pain / Neck Pain Recent onset 1 week R sided ear throbbing, radiates to neck History of some neck discomfort sore stiff as well  Morbid Obesity BMI >40 Weight Management Previously started Ozempic sample for weight loss 04/01/23 0.27m5g dose x 4 week then up to 0.5mg  She admits significant reduced appetite and overall did not feel well on medicine due to this and GI side effects. She finished the 6 week sample She has pursued diet and exercise regimens for >6 months without success overall Tried OTC supplements Almased for weight loss, OTC protein shake meal regimen and supplement.   Other comorbid conditions   Hypertension Pre-Diabetes  Chronic Back Pain Lumbar DDD She is currently taking tramadol for back pain, which is particularly bothersome at night. She typically takes one 100 mg pill in the evening as needed, approximately three times a week. The back pain is primarily located in the lower back.       09/14/2023    2:48 PM 03/28/2023    3:30 PM 02/09/2022    1:08 PM  Depression screen PHQ 2/9  Decreased Interest 0 0 0  Down, Depressed, Hopeless 0 0 0  PHQ - 2 Score 0 0 0  Altered sleeping   0  Tired, decreased energy   0  Change in appetite   0  Feeling bad or failure about yourself    0  Trouble concentrating   0  Moving slowly or fidgety/restless   0  Suicidal thoughts   0  PHQ-9 Score   0  Difficult doing work/chores   Not difficult at all       09/14/2023    2:48 PM 03/28/2023    3:30 PM 02/09/2022    1:09 PM  12/30/2021    3:31 PM  GAD 7 : Generalized Anxiety Score  Nervous, Anxious, on Edge 0 1 0 0  Control/stop worrying 0 0 0 0  Worry too much - different things 1 0 1 1  Trouble relaxing 0 0 0   Restless 1 1  0  Easily annoyed or irritable 1 1 1 1   Afraid - awful might happen 0 0 0 0  Total GAD 7 Score 3 3    Anxiety Difficulty   Not difficult at all Not difficult at all    Social History   Tobacco Use   Smoking status: Never   Smokeless tobacco: Never  Vaping Use   Vaping status: Never Used  Substance Use Topics   Alcohol use: Yes    Comment: occasional, about 3x a month   Drug use: No    Review of Systems Per HPI unless specifically indicated above     Objective:    BP 128/82   Pulse 88   Ht 5\' 6"  (1.676 m)   Wt 251 lb (113.9 kg)   LMP 10/04/2019 (Exact Date)   SpO2 96%   BMI 40.51 kg/m   Wt Readings from Last 3 Encounters:  09/14/23 251 lb (113.9 kg)  05/30/23 246 lb (111.6 kg)  03/28/23 252 lb (114.3 kg)    Physical Exam Vitals and nursing note reviewed.  Constitutional:      General: She is not in acute distress.    Appearance: Normal appearance. She is well-developed. She is obese. She is not diaphoretic.     Comments: Well-appearing, comfortable, cooperative  HENT:     Head: Normocephalic and atraumatic.  Eyes:     General:        Right eye: No discharge.        Left eye: No discharge.     Conjunctiva/sclera: Conjunctivae normal.  Neck:     Comments: Neck paraspinal muscles L>R with spasm hypertonicity, mild reduced ROM rotation Cardiovascular:     Rate and Rhythm: Normal rate.  Pulmonary:     Effort: Pulmonary effort is normal.  Skin:    General: Skin is warm and dry.     Findings: No erythema or rash.  Neurological:     Mental Status: She is alert and oriented to person, place, and time.  Psychiatric:        Mood and Affect: Mood normal.        Behavior: Behavior normal.        Thought Content: Thought content normal.     Comments: Well  groomed, good eye contact, normal speech and thoughts     Results for orders placed or performed during the hospital encounter of 03/09/23  Wet prep, genital   Collection Time: 03/09/23  8:34 AM   Specimen: Vaginal  Result Value Ref Range   Yeast Wet Prep HPF POC PRESENT (A) NONE SEEN   Trich, Wet Prep NONE SEEN NONE SEEN   Clue Cells Wet Prep HPF POC PRESENT (A) NONE SEEN   WBC, Wet Prep HPF POC <10 <10   Sperm NONE SEEN   Urinalysis, w/ Reflex to Culture (Infection Suspected) -Urine, Clean Catch   Collection Time: 03/09/23  8:34 AM  Result Value Ref Range   Specimen Source URINE, CLEAN CATCH    Color, Urine YELLOW YELLOW   APPearance CLOUDY (A) CLEAR   Specific Gravity, Urine 1.020 1.005 - 1.030   pH 6.5 5.0 - 8.0   Glucose, UA NEGATIVE NEGATIVE mg/dL   Hgb urine dipstick TRACE (A) NEGATIVE   Bilirubin Urine NEGATIVE NEGATIVE   Ketones, ur TRACE (A) NEGATIVE mg/dL   Protein, ur NEGATIVE NEGATIVE mg/dL   Nitrite NEGATIVE NEGATIVE   Leukocytes,Ua SMALL (A) NEGATIVE   Squamous Epithelial / HPF 11-20 0 - 5 /HPF   WBC, UA 6-10 0 - 5 WBC/hpf   RBC / HPF 6-10 0 - 5 RBC/hpf   Bacteria, UA MANY (A) NONE SEEN   Budding Yeast PRESENT       Assessment & Plan:   Problem List Items Addressed This Visit     Morbid obesity (HCC) - Primary   Osteoarthritis of spine with radiculopathy, lumbar region   Relevant Medications   traMADol (ULTRAM) 50 MG tablet   baclofen (LIORESAL) 10 MG tablet   Other Visit Diagnoses       Lumbar foraminal stenosis       Relevant Medications   traMADol (ULTRAM) 50 MG tablet     Muscle spasm of back       Relevant Medications   baclofen (LIORESAL) 10 MG tablet     Muscle spasms of neck       Relevant Medications   baclofen (LIORESAL) 10 MG  tablet        Morbid Obesity BMI >40 Patient has gained weight and is interested in starting a prescription weight loss medication, we discussed it in the past and tried sample GLP temporary and offered  compounding options.  Discussed options including Wegovy and Zepbound. Patient will verify insurance coverage for these medications. -Once insurance coverage is confirmed, initiate appropriate weight loss medication. -Check weight in 12 weeks from initiation of medication to assess for 5% weight loss.  Chronic Pain Patient reports using Tramadol PRN for back pain, particularly at night. -Renew Tramadol 100mg  prescription for PRN use.  Neck Pain with Referred Ear Pain Patient reports a week of right-sided ear pain and throbbing at the base of the skull, possibly related to neck tension. Ear examination was normal with a small amount of fluid behind the eardrum. Likely neck muscle spasm trigger / referred pain -Continue Baclofen for muscle relaxation and consider Tramadol for nerve pain. -Renew Baclofen prescription. -Consider massage therapy, heat application, and range of motion exercises for neck tension.         No orders of the defined types were placed in this encounter.   Meds ordered this encounter  Medications   traMADol (ULTRAM) 50 MG tablet    Sig: Take 1-2 tablets (50-100 mg total) by mouth every 6 (six) hours as needed.    Dispense:  30 tablet    Refill:  2    Patient request delivery   baclofen (LIORESAL) 10 MG tablet    Sig: Take 0.5-1 tablets (5-10 mg total) by mouth 3 (three) times daily as needed for muscle spasms.    Dispense:  90 each    Refill:  3    Delivery    Follow up plan: Return if symptoms worsen or fail to improve.   Saralyn Pilar, DO [facility] 09/14/2023, 3:02 PM

## 2023-09-15 ENCOUNTER — Encounter (HOSPITAL_COMMUNITY): Payer: Self-pay

## 2023-09-17 ENCOUNTER — Other Ambulatory Visit (HOSPITAL_COMMUNITY): Payer: Self-pay

## 2023-10-11 ENCOUNTER — Encounter: Payer: Self-pay | Admitting: Family Medicine

## 2023-10-11 DIAGNOSIS — J3089 Other allergic rhinitis: Secondary | ICD-10-CM

## 2023-10-12 MED ORDER — FEXOFENADINE HCL 180 MG PO TABS
180.0000 mg | ORAL_TABLET | Freq: Every day | ORAL | 3 refills | Status: AC
  Filled 2023-10-12 – 2023-11-07 (×3): qty 90, 90d supply, fill #0

## 2023-10-12 NOTE — Addendum Note (Signed)
 Addended by: Smitty Cords on: 10/12/2023 06:07 PM   Modules accepted: Orders

## 2023-10-13 ENCOUNTER — Other Ambulatory Visit (HOSPITAL_COMMUNITY): Payer: Self-pay

## 2023-10-14 ENCOUNTER — Encounter (HOSPITAL_COMMUNITY): Payer: Self-pay

## 2023-10-15 ENCOUNTER — Other Ambulatory Visit (HOSPITAL_COMMUNITY): Payer: Self-pay

## 2023-11-07 ENCOUNTER — Other Ambulatory Visit: Payer: Self-pay

## 2023-11-07 ENCOUNTER — Other Ambulatory Visit (HOSPITAL_COMMUNITY): Payer: Self-pay

## 2023-11-24 ENCOUNTER — Other Ambulatory Visit (HOSPITAL_COMMUNITY): Payer: Self-pay

## 2023-11-25 MED ORDER — AMLODIPINE BESYLATE 10 MG PO TABS
10.0000 mg | ORAL_TABLET | Freq: Every day | ORAL | 0 refills | Status: AC
  Filled 2023-11-25: qty 90, 90d supply, fill #0

## 2023-11-26 ENCOUNTER — Other Ambulatory Visit: Payer: Self-pay

## 2023-11-26 ENCOUNTER — Other Ambulatory Visit (HOSPITAL_COMMUNITY): Payer: Self-pay

## 2023-12-07 ENCOUNTER — Other Ambulatory Visit (HOSPITAL_COMMUNITY): Payer: Self-pay

## 2024-02-09 ENCOUNTER — Other Ambulatory Visit (HOSPITAL_COMMUNITY): Payer: Self-pay

## 2024-04-25 ENCOUNTER — Other Ambulatory Visit: Payer: Self-pay
